# Patient Record
Sex: Male | Born: 1950
Health system: Southern US, Community
[De-identification: ages and names within clinical notes are randomized; demographics above are authoritative.]

## PROBLEM LIST (undated history)

## (undated) DIAGNOSIS — I2510 Atherosclerotic heart disease of native coronary artery without angina pectoris: Secondary | ICD-10-CM

## (undated) DIAGNOSIS — R55 Syncope and collapse: Secondary | ICD-10-CM

## (undated) DIAGNOSIS — J449 Chronic obstructive pulmonary disease, unspecified: Secondary | ICD-10-CM

## (undated) DIAGNOSIS — I519 Heart disease, unspecified: Secondary | ICD-10-CM

## (undated) DIAGNOSIS — D519 Vitamin B12 deficiency anemia, unspecified: Secondary | ICD-10-CM

## (undated) DIAGNOSIS — Z9119 Patient's noncompliance with other medical treatment and regimen: Secondary | ICD-10-CM

## (undated) DIAGNOSIS — I714 Abdominal aortic aneurysm, without rupture, unspecified: Secondary | ICD-10-CM

## (undated) DIAGNOSIS — I10 Essential (primary) hypertension: Secondary | ICD-10-CM

## (undated) DIAGNOSIS — I456 Pre-excitation syndrome: Secondary | ICD-10-CM

## (undated) DIAGNOSIS — E785 Hyperlipidemia, unspecified: Secondary | ICD-10-CM

## (undated) DIAGNOSIS — D3500 Benign neoplasm of unspecified adrenal gland: Secondary | ICD-10-CM

## (undated) HISTORY — DX: Chronic obstructive pulmonary disease, unspecified: J44.9

## (undated) HISTORY — DX: Hyperlipidemia, unspecified: E78.5

## (undated) HISTORY — DX: Essential (primary) hypertension: I10

## (undated) HISTORY — DX: Patient's noncompliance with other medical treatment and regimen: Z91.19

## (undated) HISTORY — PX: CORONARY ANGIOPLASTY WITH STENT PLACEMENT: SHX49

## (undated) HISTORY — DX: Pre-excitation syndrome: I45.6

## (undated) HISTORY — PX: BLADDER SURGERY: SHX569

## (undated) HISTORY — DX: Atherosclerotic heart disease of native coronary artery without angina pectoris: I25.10

---

## 1993-08-22 HISTORY — PX: CORONARY ARTERY BYPASS GRAFT: SHX141

## 1998-07-13 ENCOUNTER — Encounter: Payer: Self-pay | Admitting: Urology

## 1998-07-15 ENCOUNTER — Ambulatory Visit (HOSPITAL_COMMUNITY): Admission: RE | Admit: 1998-07-15 | Discharge: 1998-07-15 | Payer: Self-pay | Admitting: Urology

## 1998-07-15 ENCOUNTER — Encounter: Payer: Self-pay | Admitting: Urology

## 1998-08-24 ENCOUNTER — Inpatient Hospital Stay (HOSPITAL_COMMUNITY): Admission: AD | Admit: 1998-08-24 | Discharge: 1998-08-27 | Payer: Self-pay | Admitting: Urology

## 1998-08-25 ENCOUNTER — Encounter: Payer: Self-pay | Admitting: Urology

## 1998-08-27 ENCOUNTER — Encounter: Payer: Self-pay | Admitting: Internal Medicine

## 1998-08-31 ENCOUNTER — Inpatient Hospital Stay (HOSPITAL_COMMUNITY): Admission: RE | Admit: 1998-08-31 | Discharge: 1998-09-04 | Payer: Self-pay | Admitting: Urology

## 2000-01-25 ENCOUNTER — Encounter: Payer: Self-pay | Admitting: Oral Surgery

## 2000-01-26 ENCOUNTER — Ambulatory Visit (HOSPITAL_COMMUNITY): Admission: RE | Admit: 2000-01-26 | Discharge: 2000-01-26 | Payer: Self-pay | Admitting: Oral Surgery

## 2005-02-20 ENCOUNTER — Emergency Department (HOSPITAL_COMMUNITY): Admission: EM | Admit: 2005-02-20 | Discharge: 2005-02-20 | Payer: Self-pay | Admitting: *Deleted

## 2005-02-20 ENCOUNTER — Inpatient Hospital Stay (HOSPITAL_COMMUNITY): Admission: AD | Admit: 2005-02-20 | Discharge: 2005-02-24 | Payer: Self-pay | Admitting: Internal Medicine

## 2005-02-21 ENCOUNTER — Ambulatory Visit: Payer: Self-pay | Admitting: Cardiology

## 2005-03-03 ENCOUNTER — Ambulatory Visit: Payer: Self-pay | Admitting: *Deleted

## 2006-03-02 ENCOUNTER — Observation Stay (HOSPITAL_COMMUNITY): Admission: AD | Admit: 2006-03-02 | Discharge: 2006-03-03 | Payer: Self-pay | Admitting: Cardiology

## 2006-03-02 ENCOUNTER — Ambulatory Visit: Payer: Self-pay | Admitting: Cardiology

## 2006-03-07 ENCOUNTER — Ambulatory Visit (HOSPITAL_COMMUNITY): Admission: RE | Admit: 2006-03-07 | Discharge: 2006-03-07 | Payer: Self-pay | Admitting: *Deleted

## 2006-03-07 ENCOUNTER — Ambulatory Visit: Payer: Self-pay | Admitting: Cardiology

## 2006-03-17 ENCOUNTER — Ambulatory Visit: Payer: Self-pay | Admitting: *Deleted

## 2006-03-30 ENCOUNTER — Ambulatory Visit: Payer: Self-pay | Admitting: Cardiology

## 2006-04-03 ENCOUNTER — Ambulatory Visit: Payer: Self-pay | Admitting: Cardiology

## 2006-04-03 ENCOUNTER — Inpatient Hospital Stay (HOSPITAL_BASED_OUTPATIENT_CLINIC_OR_DEPARTMENT_OTHER): Admission: RE | Admit: 2006-04-03 | Discharge: 2006-04-03 | Payer: Self-pay | Admitting: Cardiology

## 2006-04-06 ENCOUNTER — Ambulatory Visit: Payer: Self-pay | Admitting: Cardiology

## 2006-04-06 ENCOUNTER — Inpatient Hospital Stay (HOSPITAL_COMMUNITY): Admission: AD | Admit: 2006-04-06 | Discharge: 2006-04-07 | Payer: Self-pay | Admitting: Cardiology

## 2006-04-19 ENCOUNTER — Ambulatory Visit: Payer: Self-pay | Admitting: *Deleted

## 2006-10-11 ENCOUNTER — Ambulatory Visit: Payer: Self-pay | Admitting: Cardiovascular Disease

## 2008-08-29 ENCOUNTER — Ambulatory Visit: Payer: Self-pay | Admitting: Cardiology

## 2008-09-10 ENCOUNTER — Encounter (INDEPENDENT_AMBULATORY_CARE_PROVIDER_SITE_OTHER): Payer: Self-pay | Admitting: *Deleted

## 2008-09-10 LAB — CONVERTED CEMR LAB
AST: 19 units/L
Alkaline Phosphatase: 83 units/L
Cholesterol: 111 mg/dL
HDL: 27 mg/dL
LDL Cholesterol: 60 mg/dL
Triglycerides: 119 mg/dL

## 2009-02-27 ENCOUNTER — Encounter: Payer: Self-pay | Admitting: Cardiology

## 2009-03-23 ENCOUNTER — Encounter: Payer: Self-pay | Admitting: Cardiology

## 2009-08-24 ENCOUNTER — Encounter: Payer: Self-pay | Admitting: Cardiology

## 2009-09-10 ENCOUNTER — Encounter (INDEPENDENT_AMBULATORY_CARE_PROVIDER_SITE_OTHER): Payer: Self-pay | Admitting: *Deleted

## 2009-09-10 DIAGNOSIS — K219 Gastro-esophageal reflux disease without esophagitis: Secondary | ICD-10-CM | POA: Insufficient documentation

## 2009-09-10 DIAGNOSIS — F17200 Nicotine dependence, unspecified, uncomplicated: Secondary | ICD-10-CM | POA: Insufficient documentation

## 2009-09-10 DIAGNOSIS — E785 Hyperlipidemia, unspecified: Secondary | ICD-10-CM | POA: Insufficient documentation

## 2009-09-10 DIAGNOSIS — I456 Pre-excitation syndrome: Secondary | ICD-10-CM

## 2009-12-02 ENCOUNTER — Encounter: Payer: Self-pay | Admitting: Cardiology

## 2010-02-19 ENCOUNTER — Ambulatory Visit: Payer: Self-pay | Admitting: Cardiology

## 2010-02-19 DIAGNOSIS — I10 Essential (primary) hypertension: Secondary | ICD-10-CM

## 2010-02-19 DIAGNOSIS — I2510 Atherosclerotic heart disease of native coronary artery without angina pectoris: Secondary | ICD-10-CM | POA: Insufficient documentation

## 2010-02-19 DIAGNOSIS — R0602 Shortness of breath: Secondary | ICD-10-CM | POA: Insufficient documentation

## 2010-09-21 NOTE — Miscellaneous (Signed)
Summary: LABS LIPID,LIVER 09/10/2008  Clinical Lists Changes  Observations: Added new observation of ALBUMIN: 4.5 g/dL (16/05/9603 54:09) Added new observation of PROTEIN, TOT: 6.8 g/dL (81/19/1478 29:56) Added new observation of SGPT (ALT): 22 units/L (09/10/2008 15:10) Added new observation of SGOT (AST): 19 units/L (09/10/2008 15:10) Added new observation of ALK PHOS: 83 units/L (09/10/2008 15:10) Added new observation of BILI DIRECT: 0.2 mg/dL (21/30/8657 84:69) Added new observation of LDL: 60 mg/dL (62/95/2841 32:44) Added new observation of HDL: 27 mg/dL (08/24/7251 66:44) Added new observation of TRIGLYC TOT: 119 mg/dL (03/47/4259 56:38) Added new observation of CHOLESTEROL: 111 mg/dL (75/64/3329 51:88)

## 2010-09-21 NOTE — Miscellaneous (Signed)
Summary: lipitor refill  Clinical Lists Changes  Medications: Added new medication of LIPITOR 80 MG TABS (ATORVASTATIN CALCIUM) Take one tablet by mouth daily. - Signed Rx of LIPITOR 80 MG TABS (ATORVASTATIN CALCIUM) Take one tablet by mouth daily.;  #30 x 0;  Signed;  Entered by: Teressa Lower RN;  Authorized by: Loreli Slot, MD, [facility];  Method used: Electronically to [facility]*, [address], [place], [place], [place]  [address], Ph: [phone], Fax: [phone]    Prescriptions: LIPITOR 80 MG TABS (ATORVASTATIN CALCIUM) Take one tablet by mouth daily.  #30 x 0   Entered by:   Teressa Lower RN   Authorized by:   Loreli Slot, MD, [facility]   Signed by:   Teressa Lower RN on 08/24/2009   Method used:   Electronically to        [facility]* (retail)       [address]       [place], [place]  [address]       Ph: [phone]       Fax: [phone]   RxID:   [id number]

## 2010-09-21 NOTE — Assessment & Plan Note (Signed)
Summary: ROV  Medications Added TRICOR 145 MG TABS (FENOFIBRATE) take 1 tab daily ASPIRIN 325 MG TABS (ASPIRIN) take 1 tab daily      Allergies Added: NKDA  Visit Type:  Follow-up Primary Provider:  Dr. Patrica Duel   History of Present Illness: 60 year old male presents for followup. He was last seen in January of 2010. History as outlined below. He preferred conservative medical therapy following our last visit, not wanting to pursue further ischemic workup at that time.  Lipid profile from January 2010 showed cholesterol 111, triglycerides 119, HDL 27, and LDL 60. AST and ALT were normal at 19 and 22 respectively. He has had no followup labs as best I can tell.  [name] has not been particularly compliant with his followup. He reports that he is taking all his medications however. From a symptom perspective he describes NYHA class 2-3 dyspnea on exertion, occasional angina. We discussed his cardiovascular history, and the potential that he has had progression since 2007 when he underwent drug-eluting stent placement to the SVG to OM1. He reports financial difficulties and psychosocial stress at home. He indicates that he does not want to pursue any further testing at this time.  Current Medications (verified): 1)  Plavix 75 Mg Tabs (Clopidogrel Bisulfate) .... Take One Tablet By Mouth Daily 2)  Metoprolol Tartrate 50 Mg Tabs (Metoprolol Tartrate) .Marland Kitchen.. 1 Tab Two Times A Day 3)  Ramipril 2.5 Mg Caps (Ramipril) .... Take One Capsule By Mouth Daily 4)  Lipitor 80 Mg Tabs (Atorvastatin Calcium) .... Take One Tablet By Mouth Daily. 5)  Pantoprazole Sodium 40 Mg Tbec (Pantoprazole Sodium) .... Take 1 Tablet By Mouth Once A Day 6)  Tricor 145 Mg Tabs (Fenofibrate) .... Take 1 Tab Daily 7)  Aspirin 325 Mg Tabs (Aspirin) .... Take 1 Tab Daily  Allergies (verified): No Known Drug Allergies  Past History:  Past Medical History: Last updated: 09/11/2009 Wolff-Parkinson-White syndrome  (failed ablation) CAD - multivessel, LVEF 50%, occluded SVG to RCA, DES SVG to OM system 2007 Hyperlipidemia Hypertension Noncompliance  Past Surgical History: Last updated: 09/11/2009 CABG 1995 - LIMA to LAD, SVG to diagonal, SVG to OM1 and OM2, SVG to RCA Bladder surgery  Social History: Last updated: 09/11/2009 Tobacco Use - Yes Alcohol Use - no  Clinical Review Panels:  Echocardiogram Echocardiogram M-MODE:  Aorta 3.3, left atrium 4.5, septum 1.1, posterior wall 1.1, LV  diastole 5.2, LV systole 3.6.   1.  Technically adequate echocardiographic study.  2.  Mild left atrial enlargement; normal right atrium and right ventricle.  3.  Normal and trileaflet aortic valve.  4.  Delicate mitral valve; very mild regurgitation.  5.  Normal tricuspid and pulmonic valves; normal proximal pulmonary artery.  6.  Left ventricular size is at the upper limit of normal; mild LVH.  There      is akinesis of the basilar and mid inferior and posterior segments with      mildly impaired overall LV systolic function.  Estimated ejection      fraction is 0.45.  7.  Normal IVC. (03/14/2006)    Review of Systems       The patient complains of chest pain and dyspnea on exertion.  The patient denies anorexia, fever, syncope, peripheral edema, prolonged cough, headaches, melena, hematochezia, and severe indigestion/heartburn.         Otherwise reviewed and negative except as outlined.  Vital Signs:  Patient profile:   60 year old male Height:  69 inches Weight:      169 pounds BMI:     25.05 Pulse rate:   67 / minute BP sitting:   123 / 79  (right arm)  Vitals Entered By: Dreama Saa, CNA (February 19, 2010 9:55 AM)  Physical Exam  Additional Exam:  Overweight male in no acute distress. No active chest pain. HEENT: Conjunctiva and lids normal, oropharynx with poor dentition. Neck: Supple, no JVP or thyromegaly. Lungs: Coarse, diminished breath sounds, nonlabored, no  wheezing. Cardiac: Regular rate and rhythm, 2-3/6 blowing midsystolic murmur at the apex no S3. Abdomen: Soft, nontender, bowel sounds present. Skin: Warm and dry. Extremities: No pitting edema. Neuropsychiatric: Alert and oriented x3, affect grossly appropriate.   EKG  Procedure date:  02/19/2010  Findings:      Sinus rhythm with left anterior fascicular block and nonspecific T-wave changes.  Impression & Recommendations:  Problem # 1:  CORONARY ATHEROSCLEROSIS NATIVE CORONARY ARTERY (ICD-414.01)  Multivessel CAD with graft disease including an occluded SVG to RCA, status post DES to the SVG to OM system in 2007. I suspect [name] has had progressive disease in the last few years manifested as fatigue and shortness of breath. He still does not want to pursue any objective ischemic evaluation, and prefers medical therapy. We discussed this again today. I will schedule a 6 month visit, sooner if his symptoms worsen. He has not been particularly compliant with his followup however. He reports he is taking all his medications outlined above and needs no specific refills today.  His updated medication list for this problem includes:    Plavix 75 Mg Tabs (Clopidogrel bisulfate) .Marland Kitchen... Take one tablet by mouth daily    Metoprolol Tartrate 50 Mg Tabs (Metoprolol tartrate) .Marland Kitchen... 1 tab two times a day    Ramipril 2.5 Mg Caps (Ramipril) .Marland Kitchen... Take one capsule by mouth daily    Aspirin 325 Mg Tabs (Aspirin) .Marland Kitchen... Take 1 tab daily  Problem # 2:  WOLFF (WOLFE)-PARKINSON-WHITE (WPW) SYNDROME (ICD-426.7)  No palpitations. Status post failed ablation in the past.  His updated medication list for this problem includes:    Plavix 75 Mg Tabs (Clopidogrel bisulfate) .Marland Kitchen... Take one tablet by mouth daily    Metoprolol Tartrate 50 Mg Tabs (Metoprolol tartrate) .Marland Kitchen... 1 tab two times a day    Ramipril 2.5 Mg Caps (Ramipril) .Marland Kitchen... Take one capsule by mouth daily    Aspirin 325 Mg Tabs (Aspirin) .Marland Kitchen... Take  1 tab daily  Problem # 3:  HYPERTENSION (ICD-401.9)  Blood pressure well controlled today.  His updated medication list for this problem includes:    Metoprolol Tartrate 50 Mg Tabs (Metoprolol tartrate) .Marland Kitchen... 1 tab two times a day    Ramipril 2.5 Mg Caps (Ramipril) .Marland Kitchen... Take one capsule by mouth daily    Aspirin 325 Mg Tabs (Aspirin) .Marland Kitchen... Take 1 tab daily  Orders: EKG w/ Interpretation ([address])  Problem # 4:  HYPERLIPIDEMIA (ICD-272.4)  Patient has not had any followup labs. We discussed this today. He does not want to do any testing now, however will arrange fasting lipid profile liver function tests prior to his next visit. He reports tolerating Lipitor well.  His updated medication list for this problem includes:    Lipitor 80 Mg Tabs (Atorvastatin calcium) .Marland Kitchen... Take one tablet by mouth daily.    Tricor 145 Mg Tabs (Fenofibrate) .Marland Kitchen... Take 1 tab daily  Future Orders: T-Lipid Profile ([address]) ... 08/20/2010 T-Hepatic Function [id number] ... 08/20/2010  Patient Instructions:  1)  Your physician recommends that you schedule a follow-up appointment in: 6 months 2)  Your physician recommends that you return for lab work in: 6 months, just before next office visit 3)  Your physician recommends that you continue on your current medications as directed. Please refer to the Current Medication list given to you today.  Prevention & Chronic Care Immunizations   Influenza vaccine: Not documented    Tetanus booster: Not documented    Pneumococcal vaccine: Not documented  Colorectal Screening   Hemoccult: Not documented    Colonoscopy: Not documented  Other Screening   PSA: Not documented   Smoking status: current  (09/11/2009)  Lipids   Total Cholesterol: 111  (09/10/2008)   LDL: 60  (09/10/2008)   LDL Direct: Not documented   HDL: 27  (09/10/2008)   Triglycerides: 119  (09/10/2008)    SGOT (AST): 19  (09/10/2008)   SGPT (ALT): 22  (09/10/2008)   Alkaline  phosphatase: 83  (09/10/2008)   Total bilirubin: Not documented  Hypertension   Last Blood Pressure: 123 / 79  (02/19/2010)   Serum creatinine: Not documented   Serum potassium Not documented  Self-Management Support :    Hypertension self-management support: Not documented    Lipid self-management support: Not documented

## 2010-09-21 NOTE — Letter (Signed)
Summary: [place] [facility] [employer]  [address] [place] [address]   Phone: [phone]  Fax: [phone]     February 19, 2010 MRN: [id number]   [facility] [address] [place], [place]  [address]      YOUR LAB WORK IS DUE  August 20, 2010 _________________________________________  Please go to [facility], located across the street from [facility] on the second floor.  Hours are Monday - Friday 7am until 7:30pm         Saturday 8am until 12noon    _X_  DO NOT EAT OR DRINK AFTER MIDNIGHT EVENING PRIOR TO LABWORK  __ YOUR LABWORK IS NOT FASTING --YOU MAY EAT PRIOR TO LABWORK

## 2010-09-21 NOTE — Miscellaneous (Signed)
Summary: pantoprazole med update  Clinical Lists Changes  Medications: Added new medication of PANTOPRAZOLE SODIUM 40 MG TBEC (PANTOPRAZOLE SODIUM) Take 1 tablet by mouth once a day

## 2010-11-01 ENCOUNTER — Encounter: Payer: Self-pay | Admitting: *Deleted

## 2010-11-01 ENCOUNTER — Telehealth: Payer: Self-pay | Admitting: *Deleted

## 2010-11-09 NOTE — Letter (Signed)
Summary: [place] [facility], [facility] at [employer]  [address] [place] [address]   Phone: [phone]  Fax: [phone]     November 01, 2010 MRN: [id number]   [facility] [address] [place], [place]  [address]      YOUR LAB WORK IS DUE   MONDAY   November 08, 2010  Please go to [facility], located across the street from [facility] on the second floor.  Hours are Monday - Friday 7am until 7:30pm         Saturday 8am until 12noon    _X_  DO NOT EAT OR DRINK AFTER MIDNIGHT EVENING PRIOR TO LABWORK

## 2010-11-09 NOTE — Progress Notes (Signed)
Summary: Refills and Labwork   Phone Note Call from Patient   Caller: Patient Reason for Call: Talk to Nurse Summary of Call: patient would like to discuss refills on meds and labwork to be drawn . tg Initial call taken by: Raechel Ache [facility],  November 01, 2010 2:35 PM    Prescriptions: TRICOR 145 MG TABS (FENOFIBRATE) take 1 tab daily  #30 x 1   Entered by:   Teressa Lower RN   Authorized by:   Loreli Slot, MD, [facility]   Signed by:   Teressa Lower RN on 11/01/2010   Method used:   Electronically to        [facility]* (retail)       [address]       [place], [place]  [address]       Ph: [phone]       Fax: [phone]   RxID:   [id number] PANTOPRAZOLE SODIUM 40 MG TBEC (PANTOPRAZOLE SODIUM) Take 1 tablet by mouth once a day  #30 x 1   Entered by:   Teressa Lower RN   Authorized by:   Loreli Slot, MD, [facility]   Signed by:   Teressa Lower RN on 11/01/2010   Method used:   Electronically to        [facility]* (retail)       [address]       [place], [place]  [address]       Ph: [phone]       Fax: [phone]   RxID:   [id number] LIPITOR 80 MG TABS (ATORVASTATIN CALCIUM) Take one tablet by mouth daily.  #30 Each x 1   Entered by:   Teressa Lower RN   Authorized by:   Loreli Slot, MD, [facility]   Signed by:   Teressa Lower RN on 11/01/2010   Method used:   Electronically to        [facility]* (retail)       [address]       [place], [place]  [address]       Ph: [phone]       Fax: [phone]   RxID:   [id number] RAMIPRIL 2.5 MG CAPS (RAMIPRIL) Take one capsule by mouth daily  #30 x 1   Entered by:   Teressa Lower RN   Authorized by:   Loreli Slot, MD, [facility]   Signed by:   Teressa Lower RN on 11/01/2010   Method used:   Electronically to        [facility]* (retail)       [address]       [place], [place]  [address]       Ph: [phone]       Fax: [phone]   RxID:   [id number] METOPROLOL TARTRATE 50 MG TABS (METOPROLOL TARTRATE) 1 tab two times a day  #60 Each x 1   Entered by:   Teressa Lower RN   Authorized by:   Loreli Slot, MD, [facility]   Signed by:   Teressa Lower RN on 11/01/2010   Method used:   Electronically to        [facility] (retail)       [address]  [place], [place]  [address]       Ph: [phone]       Fax: [phone]   RxID:   [id number]

## 2010-11-23 ENCOUNTER — Other Ambulatory Visit: Payer: Self-pay | Admitting: Cardiology

## 2010-11-23 LAB — HEPATIC FUNCTION PANEL
ALT: 23 U/L (ref 0–53)
AST: 22 U/L (ref 0–37)
Albumin: 4.6 g/dL (ref 3.5–5.2)

## 2010-11-23 LAB — LIPID PANEL
Cholesterol: 126 mg/dL (ref 0–200)
HDL: 26 mg/dL — ABNORMAL LOW (ref >39)

## 2010-11-24 NOTE — Progress Notes (Signed)
**Note De-identified Hilman Kissling Obfuscation** Result letter mailed to pt.

## 2010-12-03 ENCOUNTER — Encounter: Payer: Self-pay | Admitting: Cardiology

## 2010-12-03 ENCOUNTER — Ambulatory Visit (INDEPENDENT_AMBULATORY_CARE_PROVIDER_SITE_OTHER): Payer: MEDICARE | Admitting: Cardiology

## 2010-12-03 VITALS — BP 116/70 | HR 60 | Ht 69.0 in | Wt 176.0 lb

## 2010-12-03 DIAGNOSIS — K921 Melena: Secondary | ICD-10-CM

## 2010-12-03 DIAGNOSIS — F17200 Nicotine dependence, unspecified, uncomplicated: Secondary | ICD-10-CM

## 2010-12-03 DIAGNOSIS — I2510 Atherosclerotic heart disease of native coronary artery without angina pectoris: Secondary | ICD-10-CM

## 2010-12-03 DIAGNOSIS — I10 Essential (primary) hypertension: Secondary | ICD-10-CM

## 2010-12-03 DIAGNOSIS — E782 Mixed hyperlipidemia: Secondary | ICD-10-CM

## 2010-12-03 DIAGNOSIS — I456 Pre-excitation syndrome: Secondary | ICD-10-CM

## 2010-12-03 MED ORDER — METOPROLOL TARTRATE 25 MG PO TABS: 25.0000 mg | ORAL_TABLET | Freq: Two times a day (BID) | ORAL | Status: DC

## 2010-12-03 NOTE — Assessment & Plan Note (Signed)
Continue present regimen, followup fasting lipid profile and liver function tests prior to next visit.

## 2010-12-03 NOTE — Progress Notes (Signed)
Clinical Summary [name] is a 60 y.o.male presenting for followup. He was seen in July 2011. In general he has preferred fairly conservative management with no major followup testing.  He reports occasional palpitations, none prolonged, no syncope. Has chronic shortness of breath, continues to smoke cigarettes. He states that he has not been able to quit. We did discuss smoking cessation strategies today including the quit line, also nicotine replacement.  He reports no angina, no nitroglycerin use. Indicates compliance with his medications.  Lab work from 3 April shows total cholesterol 126, triglycerides 111, HDL 26, LDL 78, AST 22, ALT 23.  He reports no regular follow up with primary care recently.    No Known Allergies  Current outpatient prescriptions:aspirin 325 MG tablet, Take 325 mg by mouth daily.  , Disp: , Rfl: ;  atorvastatin (LIPITOR) 80 MG tablet, Take 80 mg by mouth daily.  , Disp: , Rfl: ;  clopidogrel (PLAVIX) 75 MG tablet, Take 75 mg by mouth daily.  , Disp: , Rfl: ;  fenofibrate (TRICOR) 145 MG tablet, Take 145 mg by mouth daily.  , Disp: , Rfl:  metoprolol (LOPRESSOR) 25 MG tablet, Take 1 tablet (25 mg total) by mouth 2 (two) times daily., Disp: 180 tablet, Rfl: 1;  pantoprazole (PROTONIX) 40 MG tablet, Take 40 mg by mouth daily.  , Disp: , Rfl: ;  ramipril (ALTACE) 2.5 MG capsule, Take 2.5 mg by mouth daily.  , Disp: , Rfl: ;  DISCONTD: metoprolol (LOPRESSOR) 50 MG tablet, Take 50 mg by mouth daily. , Disp: , Rfl:   Past Medical History  Diagnosis Date  . Wolff-Parkinson-White (WPW) syndrome     Failed ablation  . Coronary artery disease     Multivessel, LVEF 50%, occluded SVG to RCA, DES SVG to OM system 2007  . Hyperlipidemia   . Hypertension   . Noncompliance     Social History [name] reports that he has been smoking Cigarettes.  He has never used smokeless tobacco. [name] reports that he does not drink alcohol.  Review of Systems States he  occasionally sees a small amount of blood in his stools, not regularly. Otherwise reports stable appetite, no indigestion or unusual weight loss. Otherwise negative except as outlined above.  Physical Examination Filed Vitals:   12/03/10 1258  BP: 116/70  Pulse: 60  Overweight male in no acute distress. No active chest pain. HEENT: Conjunctiva and lids normal, oropharynx with poor dentition. Neck: Supple, no JVP or thyromegaly. Lungs: Coarse, diminished breath sounds, nonlabored, no wheezing. Cardiac: Regular rate and rhythm, soft midsystolic murmur at the apex no S3. Abdomen: Soft, nontender, bowel sounds present. Skin: Warm and dry. Extremities: No pitting edema. Neuropsychiatric: Alert and oriented x3, affect grossly appropriate.   ECG Sinus rhythm at 60 beats per minutes, leftward axis, nonspecific T wave changes.   Problem List and Plan

## 2010-12-03 NOTE — Assessment & Plan Note (Signed)
Blood pressure well-controlled today. 

## 2010-12-03 NOTE — Patient Instructions (Addendum)
Your physician recommends that you return for lab work in: 6 months, just before next office visit Your physician has recommended you make the following change in your medication: decrease Metoprolol to 25mg  twice daily Your physician recommends that you schedule a follow-up appointment in: 6 months

## 2010-12-03 NOTE — Assessment & Plan Note (Signed)
Per patient report. I explained that this needs to be looked into further, and that he should followup with his primary care physician soon. He states he has never had a colonoscopy. He is on aspirin and Plavix for his cardiovascular disease. He states that he would go and see Dr. Regino Schultze for followup.

## 2010-12-03 NOTE — Assessment & Plan Note (Signed)
We again discussed smoking cessation strategies.

## 2010-12-03 NOTE — Assessment & Plan Note (Signed)
No reported progression in angina or shortness of breath at baseline. Medications were reviewed. He states that he is taking Lopressor 50 mg once a day. We will switch this to 25 mg twice a day, and can uptitrate if need be.

## 2010-12-03 NOTE — Assessment & Plan Note (Signed)
Relatively quiescent, occasional brief palpitations. No syncope.

## 2011-01-04 NOTE — Assessment & Plan Note (Signed)
[facility]                       [place] CARDIOLOGY OFFICE NOTE   NAME:[name], [name]                      MRN:          [id number]  DATE:08/29/2008                            DOB:          24-Nov-1950    PRIMARY CARE PHYSICIAN:  Patrica Duel, MD   REASON FOR VISIT:  Cardiac followup.   HISTORY OF PRESENT ILLNESS:  This is my first meeting with [name] in  the office.  He has been followed by a number of physicians in our  practice, most recently Dr. Eden Emms.  His cardiac history includes Evelene Croon-  Parkinson-White syndrome, reportedly status post failed attempt at  ablation by Dr. Graciela Husbands years ago, but reasonably well managed  symptomatically on beta-blocker therapy at this point.  He has coronary  artery disease status post coronary artery bypass grafting in 1995 with  a LIMA to the left anterior descending, vein graft to the diagonal, vein  graft to the first and second obtuse marginals, and vein graft to the  right coronary artery.  He was noted more recently in 2006 to have an  occluded vein graft to the right coronary artery and subsequently  underwent stent placement within the vein graft to the obtuse marginal  system in 2007.  [name] presents to the office today following  trying to get a prescription filled for his Lipitor and TriCor and being  told that he had to schedule a followup visit with his physician.  He  has not been seen here since February 2008.   He reports stable NYHA class II dyspnea on exertion, sometimes class III  when he pushes it, but has no significant anginal chest pain and is  not requiring any sublingual nitroglycerin.  He has not had a lipid  profile in some time.  LDL was 92 as of November 9562.  He has also not  had any followup stress testing.  Dr. Eden Emms recommended a Myoview in  2008, although the last one I can locate in the chart is from 2007.  Mr.  [name] and I discussed this some today and he  indicates generally  preferring conservative medical management and is very wary of stress  testing and the way that it makes him feel.  He has had no rapid  palpitations or syncope, although he does feel brief palpitations  typically in the mornings.  He denies having any prolonged tachycardia  related to his Wolff-Parkinson-White syndrome.  He continues to smoke  cigarettes, although he does voice an interest in trying to quit.  He  does state that he has tried several times and has been unsuccessful.  We talked about this some as well.   PRESENT MEDICATIONS:  1. Plavix 75 mg p.o. daily.  2. TriCor 45 mg p.o. daily.  3. Aspirin 325 mg p.o. daily.  4. Protonix 40 mg p.o. daily.  5. Altace 2.5 mg p.o. daily.  6. Lipitor 80 mg p.o. at bedtime.  7. Metoprolol 50 mg p.o. daily.  8. Sublingual nitroglycerin 0.4 mg p.r.n.  9. Xanax p.r.n.   REVIEW OF SYSTEMS:  As outlined above.  Otherwise  negative.   PHYSICAL EXAMINATION:  VITAL SIGNS:  Blood pressure is 138/88, heart  rate is 70, weight is 172 pounds, down from 175.  GENERAL:  The patient is in no acute distress.  HEENT:  Conjunctivae lids normal.  Oropharynx is clear.  NECK:  Supple.  No elevated jugular venous pressure.  No loud bruits.  No thyromegaly.  LUNGS:  Clear with diminished breath sounds.  No wheezing is noted.  CARDIAC:  Regular rate and rhythm.  Soft basal systolic murmur,  preserved second heart sound.  No pericardial rub.  ABDOMEN:  Soft, nontender.  No active bowel sounds.  EXTREMITIES:  No frank pitting edema.  Distal pulses are 1+.  SKIN:  Warm and dry.  MUSCULOSKELETAL:  No kyphosis noted.  NEUROPSYCHIATRIC:  The patient is alert and oriented x3.  Affect seems  appropriate.   IMPRESSION AND RECOMMENDATIONS:  1. Multivessel cardiovascular disease status post coronary artery      bypass grafting in 1995 with known subsequently documented graft      disease status post percutaneous intervention most recently in  2007      addressing the vein graft to the obtuse marginal system.  Ejection      fraction has been in a low normal range based on the records,      assessed at 50% with inferior hypokinesis as of 2007.  [name]      is not reporting any decompensated heart failure symptoms and has      stable NYHA class II dyspnea on exertion with no active angina.  I      did recommend a followup Myoview, as it has been several years      since his last assessment, although he is not interested in      proceeding with this.  We will therefore plan to continue medical      therapy and observation of symptoms with an office followup over      the next 6 months.  His electrocardiogram today in the office      showed sinus rhythm with a leftward axis deviation which is old,      and otherwise no acute ST-T-wave changes.  2. Hyperlipidemia, on Lipitor and Tricor.  We will arrange followup      lipid profile and liver function tests.  Ideally, his LDL should be      around 70.  3. I also recommended that [name] maintain more regular followup      with his primary care physician for routine health maintenance.     Jonelle Sidle, MD  Electronically Signed    SGM/MedQ  DD: 08/29/2008  DT: 08/30/2008  Job #: [id number]   cc:   Patrica Duel, M.D.

## 2011-01-07 NOTE — Assessment & Plan Note (Signed)
[facility]                         [facility] OFFICE NOTE   NAME:Pankowski, ADVAITH LAMARQUE                      MRN:          [id number]  DATE:04/19/2006                            DOB:          11/28/1950    PRIMARY:  Patrica Duel, MD   Mr. Marcin returns after an angioplasty to the saphenous vein graft to his  obtuse marginal, which was done successful with 2 stents.  He is doing very  well, has no complaints, the leg feels fine, his groin in fine, no pain in  his leg, no chest discomfort.   PRESENT MEDICATIONS:  1. Metoprolol 50 mg twice a day.  2. Lipitor 80 mg once a day.  3. Plavix 75 mg once a day.  4. TriCor 145 mg once a day.  5. Aspirin 325 mg once a day.  6  Protonix 40 mg once a day.  1. Altace 2.5 mg once a day.  2. Nitroglycerin on an as-needed basis, has not required that since his      angioplasty.   PHYSICAL EXAMINATION:  VITAL SIGNS:  Today on physical exam, his blood  pressure is 110/72, his pulse is 74.  CHEST:  Clear.  NECK:  He has no jugular venous distention or carotid bruits.  HEART:  Regular with no murmur.  First and second heart sounds are normal.  ABDOMEN:  His groin has a small area of ecchymosis in the medial aspect.  There is no bruit.  EXTREMITIES:  His pulses are 1+ distally.   ASSESSMENT:  I think Mr. Mcconathy is doing well.  He is on an increased dose  of Lipitor and the TriCor combination in the hopes of getting his  cholesterol under better control to decrease any incidents of vein graft  disease.  Overall, his medical regimen appears to be very good and he is  tolerating it reasonably well.  He is back exercising, which is good, so  hopefully we will keep him from having any recurrent stenoses.                                   Farris Has. Dorethea Clan, MD   JMH/MedQ  DD:  04/19/2006  DT:  04/20/2006  Job #:  [id number]   cc:   Patrica Duel, MD

## 2011-01-07 NOTE — Discharge Summary (Signed)
NAMEBREYON, SIGG               ACCOUNT NO.:  [id number]   MEDICAL RECORD NO.:  [id number]          PATIENT TYPE:  INP   LOCATION:  2902                         FACILITY:  [facility]   PHYSICIAN:  Dorian Pod, NP    DATE OF BIRTH:  1951/01/26   DATE OF ADMISSION:  03/02/2006  DATE OF DISCHARGE:  03/03/2006                                 DISCHARGE SUMMARY   DISCHARGE DIAGNOSES:  1.  Chest pain, somewhat atypical.  Patient ruled out for myocardial      infarction.  Status post stress test this admission showing ejection      fraction of 46%, old infarct/scar within the inferior wall.  There is a      small area of reversibility in the adjacent septum compatible with      ischemia.  2.  Mitral regurgitation.  3.  History of medical noncompliance.  4.  History of coronary artery disease, status post coronary artery bypass      grafting in the past.  5.  Mildly elevated glucose levels this admission with a hemoglobin A1c of      5.9.  6.  Hypertension.  7.  Hyperlipidemia.  8.  Most recent catheterization February 21, 2005 following subendocardial      myocardial infarction.  Catheterization at that time showed an ejection      fraction of 60% with 1 to 2+ mitral regurgitation.  The saphenous vein      graft to the right coronary artery was completely occluded.  Patient was      treated medically.  9.  History of Wolff-Parkinson-White, status post failed attempt at      ablation.  10. History of previous bladder surgery.  11. History of noncompliance.   PROCEDURES THIS ADMISSION:  Include stress Myoview on March 03, 2006.   HOSPITAL COURSE:  Mr. Passe is a 60 year old Caucasian gentleman with past  medical history as stated above who presented with complaints of chest  discomfort in setting of known history of coronary artery disease.  EKG  showed normal sinus rhythm with a left anterior fascicular block and  nonspecific ST changes.  Chest x-ray without any acute findings.  The  patient admitted, ruled out myocardial infarction, proceeded with stress  test, results as stated above.  Findings discussed with Dr. Daleen Squibb and patient  is adamant that he is going to be discharged.   PLAN:  The plan at this time is to discharge patient home, have him follow  up with Dr. Dorethea Clan.  Patient will also need an outpatient 2D echocardiogram  done.  This can be done through Dr. Marchelle Folks office in [place].  There  was also a question of some melena 1 month ago, per patient.  We continued  him on his Protonix.   DISCHARGE BLOOD WORK:  CBC:  H&H 14.1 and 42.1.  TSH 3.720.  Hemoglobin A1c  5.9.  Glucose 108.  Potassium 3.7.  BUN 5, creatinine 1.2, AST 18, ALT 26.   DISCHARGE INSTRUCTIONS:  Patient being discharged home with instructions to  continue his previous medications.  I have given  him a prescription for  nitroglycerin sublingual p.r.n.   DISCHARGE MEDICATIONS:  1.  Aspirin.  2.  Protonix 40 mg.  3.  Plavix 75 mg.  4.  Lipitor 40 mg.  5.  Lopressor 25.  6.  Tricor.   FOLLOWUP:  He is to follow up with Dr. Dorethea Clan within the next 2 weeks for an  outpatient echocardiogram and post-hospitalization visit.  I have called the  [place] office and left a message for them to call patient at home to  schedule appointments.  I have instructed Mr. Monette to return to the  emergency room or seek medical assistance if he has to take 3 nitroglycerin  without relief within 15 minutes.  Otherwise, follow up with Dr. Dorethea Clan as  previously scheduled.      Dorian Pod, NP     MB/MEDQ  D:  03/03/2006  T:  03/04/2006  Job:  [id number]   cc:   Thomas C. Wall, M.D.  [address]  [address]  [place]  [place] [address]   Vida Roller, M.D.  Fax: [phone]

## 2011-01-07 NOTE — Cardiovascular Report (Signed)
[name], [name]               ACCOUNT NO.:  [id number]   MEDICAL RECORD NO.:  [id number]          PATIENT TYPE:  INP   LOCATION:  6533                         FACILITY:  [facility]   PHYSICIAN:  Salvadore Farber, MD  DATE OF BIRTH:  [birth year]   DATE OF PROCEDURE:  04/06/2006  DATE OF DISCHARGE:                              CARDIAC CATHETERIZATION   PROCEDURE:  Placement of two overlapping drug-eluting stents to the  saphenous vein graft to the obtuse marginal, using Proxis Embolic  Protection.   INDICATIONS:  Mr. Leung is a 60 year old gentleman status post coronary  artery bypass grafting in 1995.  He was recently briefly hospitalized with  unstable angina.  He was subsequently discharged home.  A resting Myoview  demonstrated an ejection fraction of 46%, with a fixed inferior defect and a  reversible anteroseptal defect.  He underwent diagnostic angiography by Dr.  Antoine Poche.  That demonstrated 99% stenosis in the saphenous vein graft to two  obtuse marginal branches.  He returns today for planned intervention on  this.   PROCEDURAL TECHNIQUE:  Informed consent was obtained.  Under 1% lidocaine  local anesthesia, a 6-French sheath was placed in the right common femoral  artery using the modified Seldinger technique.  The patient had been  maintained on Plavix for more than 5 days prior to the procedure.  Anticoagulation was initiated with bivalirudin.  ACT was confirmed to be  greater than 225 seconds.   A 6-French AL-1 guide was advanced over a wire and engaged in the ostium of  the vein graft to the marginal.  A Prowater wire was advanced into the  proximal graft, taking care not to cross the lesion.  I then advanced a  Proxis device over this into the proximal graft.  The Proxis balloon was  inflated and cessation of flow in graft confirmed by gentle contrast  injection.  There were fairly robust collaterals to the territory, allowing  dye washout.  I then advanced the  Prowater wire across into the more distal  obtuse marginal without difficulty.  I then pre-dilated the entirety of the  lesion using a 2 x 20 mm Maverick for 2 inflations each at 6 atmospheres.  I  then deflated the Proxis device after aspirating debris.  We then reinflated  the Proxis device and advanced a 2.5 x 16 mm Taxus stent across the more  distal of the two anastomoses into the second of the 2 grafted marginals.  It extended approximately 5 mm into the native vessel and across the distal  anastomosis.  I deployed it at 12 atmospheres.  I then advanced a 3 x 24 mm  Taxus stent, positioning it so as to overlap the previously placed stent by  approximately 2 mm, and to cross the entire segment of disease, including  jailing the more anastomosis with the more proximal obtuse marginal.  I  deployed this at 14 atmospheres.  We then again deflated the Proxis balloon  after aspiration.  Finally, we reinflated the Proxis balloon and postdilated  the stents using 2.5 x 20 mm Quantum at 14 atmospheres  for the distal stent,  a 3 x 20 mm Quantum for the region of overlap, hitting the region of overlap  at 18 atmospheres, then a 3.25 x 20 mm Quantum balloon at 16 atmospheres  proximally.  We then again aspirated from the graft and deflated the Proxis  device.  Final angiography demonstrated no residual stenosis, no dissection,  and TIMI 3 flow into both of the grafted marginals.   Substantial sandy debris plus one large atherothrombotic chunk was removed  with aspiration from the Proxis device.   Finally, the arteriotomy was closed using a StarClose device.  Complete  hemostasis was obtained.  The patient was then transferred to the holding  room in stable condition.   COMPLICATIONS:  None.   IMPRESSION/PLAN:  Successful placement of two drug-eluting stents in the  saphenous vein graft to the marginal.  This reduced the stenosis from 99% to  0%.  Flow in both marginals is maintained.  Due to  the overlapping nature of  the two stents, he should be maintained on aspirin and Plavix indefinitely.      Salvadore Farber, MD  Electronically Signed     WED/MEDQ  D:  04/06/2006  T:  04/06/2006  Job:  [id number]   cc:   Patrica Duel, M.D.  Gerrit Friends. Dietrich Pates, MD, [facility]

## 2011-01-07 NOTE — Discharge Summary (Signed)
[name], [name]               ACCOUNT NO.:  [id number]   MEDICAL RECORD NO.:  [id number]          PATIENT TYPE:  INP   LOCATION:  2902                         FACILITY:  [facility]   PHYSICIAN:  Dorian Pod, NP    DATE OF BIRTH:  03-Oct-1950   DATE OF ADMISSION:  03/02/2006  DATE OF DISCHARGE:  03/03/2006                                 DISCHARGE SUMMARY   DISCHARGE DIAGNOSES:  1.  Chest pain ruled out for a myocardial infarction status post stress      Myoview showing EF of 46%.  A fixed defect involving the inferior wall      compatible with old infarct.  An area of reversibility noted in the      adjacent septum compatible with a small area of peri infarct ischemia.  2.  Mitral regurgitation murmur on exam pending outpatient echocardiogram.  3.  History of medical compliance.  4.  Hypercholesteremia.  5.  History of coronary artery disease status post coronary artery bypass      graft with a left internal mammary artery to the left anterior      descending.  Saphenous vein graft to the OM1, saphenous vein graft to      the OM2.  Saphenous vein graft to the right coronary artery completing      occluded by cath in 2006.      Dorian Pod, NP     MB/MEDQ  D:  03/03/2006  T:  03/04/2006  Job:  [id number]

## 2011-01-07 NOTE — Procedures (Signed)
[name], [name]               ACCOUNT NO.:  [id number]   MEDICAL RECORD NO.:  [id number]          PATIENT TYPE:  OUT   LOCATION:  RAD                           FACILITY:  [facility]   PHYSICIAN:  Carl Bing, M.D. [facility] OF BIRTH:  1951-06-22   DATE OF PROCEDURE:  03/07/2006  DATE OF DISCHARGE:                                  ECHOCARDIOGRAM   REFERRING PHYSICIAN:  Vida Roller, M.D.  Patrica Duel, M.D.   CLINICAL DATA:  60 year old gentleman with prior CABG surgery.   M-MODE:  Aorta 3.3, left atrium 4.5, septum 1.1, posterior wall 1.1, LV  diastole 5.2, LV systole 3.6.   1.  Technically adequate echocardiographic study.  2.  Mild left atrial enlargement; normal right atrium and right ventricle.  3.  Normal and trileaflet aortic valve.  4.  Delicate mitral valve; very mild regurgitation.  5.  Normal tricuspid and pulmonic valves; normal proximal pulmonary artery.  6.  Left ventricular size is at the upper limit of normal; mild LVH.  There      is akinesis of the basilar and mid inferior and posterior segments with      mildly impaired overall LV systolic function.  Estimated ejection      fraction is 0.45.  7.  Normal IVC.      Polo Bing, M.D. [facility]  Electronically Signed     RR/MEDQ  D:  03/07/2006  T:  03/07/2006  Job:  [id number]

## 2011-01-07 NOTE — H&P (Signed)
[name], SCHNELLE               ACCOUNT NO.:  [id number]   MEDICAL RECORD NO.:  [id number]          PATIENT TYPE:  INP   LOCATION:  2902                         FACILITY:  [facility]   PHYSICIAN:  Olga Millers, M.D. [facility] OF BIRTH:  [birth year]   DATE OF ADMISSION:  03/02/2006  DATE OF DISCHARGE:                                HISTORY & PHYSICAL   HISTORY OF PRESENT ILLNESS:  Mr. Raisanen is a 60 year old gentleman with past  medical history of coronary artery disease (past coronary artery bypassing  graft), hypertension, hyperlipidemia, who presents for evaluation of chest  pain.  The patient is status post coronary artery bypassing graft in 1995.  His most recent catheterization was performed on February 21, 2005 following  subendocardial myocardial infarction.  At that time his ejection fraction  was 60% and there was 1-2+ mitral regurgitation.  The left main was normal.  It was 99% proximal LAD but the sequential LIMA to the LAD and diagonal was  patent.  The circumflex was occluded but the sequential saphenous vein graft  to the OM-1 and OM-2 were patent.  There was a total right coronary artery  and saphenous vein graft to the right coronary artery which was occluded.  The patient was treated medically.  Since then, he has had occasional chest  pain.  However, over the past three weeks, he is complaining of increased  fatigue as well as dyspnea.  He also is complaining of a chest tightness.  The pain is almost continuous for the past three days.  It does increase  with exertion but is persistent.  There is question of some relief with  inspiration  He states that it worsens when I think about it.  There is no  associated nausea, vomiting, nor diaphoresis but there has been some  shortness of breath.  The pain is substernal but there is also a component  to the right of the sternum.  He was seen in the [facility] emergency room  today and transferred for further evaluation.   ALLERGIES:  CODEINE.   MEDICATIONS:  1.  Aspirin typically but he stopped that approximately one month ago as he      was concerned about the possibility of GI bleeding.  2.  He takes Protonix 40 mg p.o. daily.  3.  Plavix 75 mg p.o. daily.  4.  Lipitor 40 mg p.o. at night.  5.  Lopressor 25 mg p.o. b.i.d.  6.  Tricor.   SOCIAL HISTORY:  He does smoke.  He does not consume alcohol.   FAMILY HISTORY:  Strongly positive for coronary artery disease.   PAST MEDICAL HISTORY:  Hypertension and hyperlipidemia but there is no  diabetes mellitus.  He has history of coronary artery disease as outlined in  the HPI.  He also has history of WPW and is status post failed attempt at  ablation.  He has had previous bladder surgery.  There is also history of  noncompliance.   REVIEW OF SYSTEMS:  He has not had headaches.  There is a question of a  fever earlier today.  He does have fatigue.  There is no change in weight.  He denies any cough or hemoptysis.  There is no dysphagia or odynophagia.  There was a question of melena a month ago.  There is no hematochezia.  There is no dysuria or hematuria.  There is no rashes or seizure activity.  He does have some dyspnea on exertion but there is no orthopnea, PND or  pedal edema.  The remaining systems are negative.   PHYSICAL EXAMINATION:  VITAL SIGNS:  Blood pressure 138/85, and his pulse is  72.  He had a temperature of 99.2.  GENERAL:  He is well-developed, well-nourished, in no acute distress.  He  does not appear to be depressed.  There is no peripheral clubbing.  SKIN:  Warm and dry.  HEENT: Unremarkable with normal eyelids.  NECK:  Supple with normal upstroke bilaterally.  No bruits noted.  There is  no jugular venous distension and I cannot thyromegaly.  CHEST:  Clear to auscultation __________ .  CARDIOVASCULAR:  Regular rate and rhythm.  There is a 2-3/6 systolic murmur  at the apex and radiating to the left axis.  ABDOMEN:  No  tenderness to palpation.  I cannot appreciate hepatomegaly.  There are no masses palpated.  No bruits noted.  He  has 2+ femoral pulses  bilaterally.  No bruits.  EXTREMITIES:  No edema and I could palpate no cords.  He has 2+ dorsalis  pedis pulses bilaterally.  NEUROLOGIC: Grossly intact.   LABORATORY DATA:  His electrocardiogram shows a normal sinus rhythm at a  rate of 83.  There is a left anterior fascicular block and nonspecific ST  changes are noted.  His chest x-ray shows no significant disease.  His white  blood cell count is 5.9, hemoglobin 15.6, hematocrit 45.6.  His platelet  count was 230.  His sodium is 138, potassium 3.5.  His BUN and creatinine  are 3 and 1.2.  His glucose is mildly elevated at 138.  His initial enzymes  are negative.   DIAGNOSES:  1.  Atypical chest pain.  2.  Coronary artery disease, status post coronary bypassing graft.  3.  Hypertension.  4.  Hyperlipidemia.  5.  Mitral regurgitation murmur.   PLAN:  Mr Deboer presents with chest pain that is atypical.  It has been  almost continuous for the past three days.  We will plan to rule out  myocardial infarction with serial enzymes.  If they are negative, then we  will plan a Myoview for restratification.  We will continue with this  aspirin, Plavix, Lopressor and Lipitor.  I will check a TSH for his fatigue.  He will need a followup echocardiogram as an outpatient and he does have a  mitral regurgitation murmur on exam and his catheterization previously  revealed 1-2+ mitral regurgitation.  There was a question of melena one  month ago.  We will hemoccult all stool.  He will continue on his Protonix.  His glucose is mildly elevated and we will check a hemoglobin A1C.           ______________________________  Olga Millers, M.D. [facility]     BC/MEDQ  D:  03/02/2006  T:  03/02/2006  Job:  [id number]

## 2011-01-07 NOTE — Cardiovascular Report (Signed)
NAMEJAMIAN, [name]               ACCOUNT NO.:  [id number]   MEDICAL RECORD NO.:  [id number]          PATIENT TYPE:  OIB   LOCATION:  1965                         FACILITY:  [facility]   PHYSICIAN:  Rollene Rotunda, M.D.   DATE OF BIRTH:  Dec 31, 1950   DATE OF PROCEDURE:  DATE OF DISCHARGE:                              CARDIAC CATHETERIZATION   PRIMARY:  Is Dr. Patrica Duel.   CARDIOLOGIST:  Is Dr. Dionicio Stall.   PROCEDURE:  Left heart catheterization/coronary arteriography.   PROCEDURE NOTE:  Left heart catheterization performed via the right femoral  artery __________ function.  A #4-French arterial sheath was inserted via  the modified Seldinger technique.  A preformed Judkins and a pigtail  catheter were utilized.  The patient tolerated the procedure well and left  the lab in stable condition with good results.   HEMODYNAMICS:  LV 129/12, AL 130/96.   CORONARIES:  The left main had 25% stenosis.  The LAD was occluded in the  mid segment after tandem 99% proximal lesions that compromised first and  septal perforator.  The remainder of the vessel was seen to fill LIMA graft.  It was a large vessel wrapping the apex.  There were luminal irregularities.  There was a small diagonal also filling via the LIMA graft.  The circumflex  was occluded proximally.  There was an obtuse marginal one, which was small  with proximal subtotal stenosis filling via the vein graft.  OM2 was  occluded at the ostium.  OM3 was occluded proximally.  The right coronary  artery was a dominant vessel, it was occluded proximally.  There was scant  collateral filling from the left system to the distal RCA.  Grafts from the  LIMA to the LAD was patent.  This was a sequential graft to the diagonal, it  was also patent.  There was a slight anastomotic lesion at the LIMA to the  LAD.  The saphenous vein graft to the right coronary artery was known to be  occluded and was not selective injected.  The saphenous vein  graft to the  circumflex had been previously widely patent.  It was sequential to obtuse  marginal 1 and 2.  There was 50% stenosis before its insertion to the first  obtuse marginal.  There was 99% followed by less severe diffuse disease in  the segment before the second OM2.  This was a progression of disease  compared to 2006.   LEFT VENTRICULOGRAM:  Left ventriculogram was obtained in the artery of  objection.  EF is about 50% with inferior kinesis.  There was 2+ mitral  regurgitation.   CONCLUSION:  Severe native 3-vessel coronary artery disease.  Occluded right  coronary artery, patent LIMA and high-grade disease in the saphenous vein  graft to the circumflex.   PLAN:  The patient was first counseled to stop smoking.  Continue with  aggressive medical management.  I will review with Dr. Samule Ohm to discussed  the feasibility of percutaneous revascularization to the saphenous vein  graft to the circumflex.  ______________________________  Rollene Rotunda, M.D.     JH/MEDQ  D:  04/03/2006  T:  04/03/2006  Job:  [id number]   cc:   Vida Roller, M.D.  Patrica Duel, M.D.

## 2011-01-07 NOTE — Op Note (Signed)
[facility]  Patient:    NOBORU, BIDINGER                      MRN: [id number] Proc. Date: 01/26/00 Adm. Date:  [id number] Disc. Date: [id number] Attending:  Georgia Lopes CC:         Celso Sickle, M.D.                           Operative Report  PREOPERATIVE DIAGNOSES:  Multiple nonrestorable teeth, #5, 6, 8, 9, 10, 11, 12, 23, 25, 27, 26, 20, and 22, bilateral mandibular tori, bilateral maxillary hyperplastic tuberosities.  POSTOPERATIVE DIAGNOSES:  Multiple nonrestorable teeth, #5, 6, 8, 9, 10, 11, 12, 23, 25, 27, 26, 20, and 22, bilateral mandibular tori, bilateral maxillary hyperplastic tuberosities.  PROCEDURE:   Full-mouth extractions of above teeth, reduce bilateral maxillary tuberosities, and remove mandibular tori.  ANESTHESIA:  Georgia Lopes, D.M.D.  ANESTHESIA:  Lestine Box, M.D., general oral.  DESCRIPTION OF PROCEDURE:  The patient was taken to the operating room and placed on the table in supine position.  General anesthesia was administered intravenously.  An oral endotracheal tube was placed in the mouth.  The eyes were taped, and gauze pads were placed over them.  Then the patient was prepped and draped, the table was turned, and then a bite block was placed in the mouth.  Carbocaine 3% with no epinephrine was administered as an inferior alveolar block on the right left sides and buccal and palatal infiltration around the maxilla, a a total of 11 Carpules at 1.8 cc per Carpule were used. Then using a #15 blade, a full-thickness incision was made around teeth #20, 23, 22, and the other teeth in the mandible on the buccal and lingual side.  A #15 blade was used to make a full-thickness incision, then a drill with a 702 bur was used to section the bridge between teeth #20 and 22.  Then the lower teeth were removed with dental elevators and with forceps, then sockets were curetted, irrigated, and then the reflection was  carried lingually to expose the mandibular tori.  This was removed with an _____ bur and copiously irrigated.  Alveoloplasty was performed, and then 3-0 chromic was used to suture the left mandible.  In the maxilla on the left side, a #15 blade was used to make an elliptical incision of the fibrous tuberosity.  It was removed and then sutured with 3-0 chromic.  Then the remaining maxillary teeth had an incision around the sulcus next to these teeth.  A periosteal elevator was used to reflect the periosteum and then the teeth removed with a 301 elevator, dental forceps.  Sockets were curetted.  The alveolus was trimmed with an _____ bur, and then the area was closed with 3-0 chromic.  The bite block was repositioned and attention was turned to the left mandible.  The incision was carried onto the buccal crest to the area of approximately the first molar. Reflection was carried out buccally and lingually.  The remaining teeth were removed on this side, and the bone was smoothed with an _____ bur, and the torus was removed with an _____ bur also.  Then the area was irrigated and closed with 3-0 chromic.  In the right maxilla, a 15 blade was used to resect the hyperplastic tuberosity using an elliptical incision.  The tissue was removed.  Then the bone  was trimmed using an _____ bur.  The area was then irrigated and closed with 3-0 chromic.  Then the oral cavity was irrigated copiously, suctioned, and throat pack was removed.  Then gauze _____ were placed in the mouth and the patient was awakened, extubated, and taken to the recovery room, breathing spontaneously.  EBL: Minimum.  Complications:  None. Specimens:  None. DD:  01/26/00 TD:  01/30/00 Job: [id number]

## 2011-01-07 NOTE — Discharge Summary (Signed)
[name], [name]               ACCOUNT NO.:  [id number]   MEDICAL RECORD NO.:  [id number]          PATIENT TYPE:  INP   LOCATION:  6533                         FACILITY:  [facility]   PHYSICIAN:  [name] Friends. Dietrich Pates, MD, FACCDATE OF BIRTH:  07-18-1951   DATE OF ADMISSION:  04/06/2006  DATE OF DISCHARGE:  04/07/2006                                 DISCHARGE SUMMARY   PRIMARY CARDIOLOGIST:  [name], M.D.   PRIMARY CARE PHYSICIAN:  [name], M.D.   PRINCIPAL DIAGNOSIS:  Coronary artery disease   SECONDARY DIAGNOSES:  1. Hyperlipidemia.  2. Gastroesophageal reflux disease.  3. History of Wolff-Parkinson-White syndrome, status post failed      radiofrequency ablation.  4. Ongoing tobacco abuse.   ALLERGIES:  1. CODEINE.  2. DIGOXIN.  3. DILTIAZEM.  4. VERAPAMIL.   PROCEDURE:  PCI and stenting of the saphenous vein graft to the left  circumflex, with placement of 2.5 x 16 mm and 3 x 24 mm Taxus drug-eluting  stents.   HISTORY OF PRESENT ILLNESS:  A 60 year old male with prior history of CAD,  status post CABG in 1995.  He was recently seen by Dr. Vida [name] in  clinic on March 17, 2006 with complaints of chest discomfort, with subsequent  Myoview study revealing fixed defect in the inferior wall and reversible  defect in the anterior septum.  For that reason, he underwent left heart  cardiac catheterization on April 03, 2006 which retrieved a 99% lesion in  the saphenous vein graft to the left circumflex.  He presented back to the  catheterization lab on April 06, 2006 for PCI.   HOSPITAL COURSE:  He underwent successful PCI of the vein graft to the left  circumflex, with placement of a 2.5 x 16 mm Taxus drug-eluting stent, as  well as 3 x 24 mm Taxus drug-eluting stent.  He tolerated this procedure  well, and postprocedure his EKG has remained stable, without any acute  changes.  He is being discharged home today in satisfactory condition.   DISCHARGE  LABORATORIES:  Hemoglobin 14.9, hematocrit 43.6, WBC 7.2,  platelets 211, MCV 91.7.  Sodium 41, potassium 3.9, chloride 108, CO2 29,  BUN 9, creatinine 1.1, glucose 108, calcium 9.7.   DISPOSITION:  The patient is being discharged home today in good condition.   FOLLOW-UP PLANS AND APPOINTMENTS:  He has a follow-up appointment with Dr.  Vida [name] on April 19, 2006 at 1:00 p.m.  He is asked to follow up  with his primary care physician, Dr. Nobie [name], in 3-4 weeks.   DISCHARGE MEDICATIONS:  1. Enteric coated aspirin 325 mg daily.  2. Plavix 75  mg daily.  3. Metoprolol 25 mg b.i.d.  4. Altace 2.5 mg daily.  5. Protonix 4 mg daily.  6. Lipitor 80 mg at bedtime.  7. TriCor 145 mg daily.  8. Nitroglycerin 0.4 mg sublingual p.r.n. chest pain.  9. Fish oil 2 tablets b.i.d.   OUTSTANDING LABORATORY STUDIES:  None.   DURATION OF DISCHARGE ENCOUNTER:  35 minutes, including physician time,     ______________________________  [name] Deer  Brion [name], ANP      [name] Friends. Dietrich Pates, MD, [facility]  Electronically Signed    CB/MEDQ  D:  04/07/2006  T:  04/07/2006  Job:  [id number]   cc:   [name], M.D.

## 2011-01-07 NOTE — Discharge Summary (Signed)
[name], [name]               ACCOUNT NO.:  [id number]   MEDICAL RECORD NO.:  [id number]          PATIENT TYPE:  INP   LOCATION:  2005                         FACILITY:  [facility]   PHYSICIAN:  Arvilla Meres, M.D. LHCDATE OF BIRTH:  June 16, 1951   DATE OF ADMISSION:  02/20/2005  DATE OF DISCHARGE:  02/24/2005                                 DISCHARGE SUMMARY   ADMISSION DIAGNOSIS:  Unstable angina.   SECONDARY DIAGNOSIS:  Wolff-Parkinson-White disease.   PROCEDURES PERFORMED DURING THIS HOSPITALIZATION:  1.  Left heart catheterization with coronaries and bypass graft on February 21, 2005.  2.  Abdominal ultrasound on February 25, 2005.   SECONDARY DIAGNOSES:  1.  Mild ablation of a Wolff-Parkinson-White syndrome approximately 10 years      ago.  2.  Bladder surgery in the past.  3.  History of hyperlipidemia.  4.  Noncompliance with medications.   HISTORY OF PRESENT ILLNESS:  The patient is a 60 year old Caucasian male  with known coronary artery disease, status post bypass surgery in 1995.  The  patient also has a history of a failed catheter ablation approximately 10  years ago for Wolff-Parkinson-White disease.  The patient was admitted for  substernal chest tightness, shortness of breath and to rule out MI.   HOSPITAL COURSE:  The patient was started on heparin, Integrilin and aspirin  with positive CK-MB and troponin elevations.  On February 21, 2005, a left heart  catheterization with coronary-to-femoral bypass graft was obtained  for a  non-ST-segment MI.  The patient's positive MI was found to be occlusion of  the SVG to the RCA.  The graft was very small.  It was decided to treat the  patient medically.  On February 22, 2005, the patient did not complain of any  more chest wall pain.  On February 23, 2005, the patient had some chest pain, but  was stable at that point.  The patient also had abdominal ultrasound on February 23, 2005.  Abdominal ultrasound showed no gallstones, biliary duct  dilatation  and mild hepatomegaly.  On February 24, 2005, it was deemed that the patient was  medically stable to be discharged to home.   DISCHARGE LABORATORY DATA:  The patient was discharged with the following  lab results:  White blood cell count 9.3, hemoglobin of 14.3, hematocrit of  42.0, platelet count of 214,000; sodium of 137, potassium 4.3, chloride 101,  CO2 of 27, glucose of 95, BUN of 8, creatinine of 0.96.   DISCHARGE MEDICATIONS:  The patient was also discharged on the following  medications:  1.  Aspirin 325 mg  p.o. daily, enteric-coated.  2.  Protonix 40 mg p.o. daily.  3.  Plavix 75 mg p.o. daily.  4.  Lipitor 40 mg p.o. nightly.  5.  Lopressor 25 mg b.i.d.   DIET:  She was also instructed to eat a heart-healthy, low-fat, low-  cholesterol diet.   ACTIVITY:  The patient was also instructed to not lift any more than 10  pounds for a week.   SPECIAL DISCHARGE INSTRUCTIONS:  The patient was also strongly encouraged to  stop smoking.   FOLLOWUP:  The patient will have a followup with Dr. Dorethea Clan in [place],  Thursday, March 03, 2005, at 2:15.     ______________________________  April Humphrey, NP      Arvilla Meres, M.D. [facility]  Electronically Signed    AH/MEDQ  D:  04/18/2005  T:  04/19/2005  Job:  [id number]

## 2011-01-07 NOTE — H&P (Signed)
NAMETYEE, VANDEVOORDE               ACCOUNT NO.:  [id number]   MEDICAL RECORD NO.:  [id number]          PATIENT TYPE:  INP   LOCATION:  2926                         FACILITY:  [facility]   PHYSICIAN:  Doylene Canning. Ladona Ridgel, M.D.  DATE OF BIRTH:  1951/03/16   DATE OF ADMISSION:  02/20/2005  DATE OF DISCHARGE:                                HISTORY & PHYSICAL   ADMISSION DIAGNOSIS:  Unstable angina.   SECONDARY DIAGNOSIS:  Wolff-Parkinson White Syndrome.   HISTORY OF PRESENT ILLNESS:  The patient is a 60 year old man with known  coronary artery disease status post bypass surgery in 1995. He also has a  history of WPW syndrome and underwent failed catheter ablation approximately  10 years ago. The patient's palpitations have been fairly well controlled  recently. He has had chest pain off and on for several months, but over the  last several weeks and particularly over the last day or two, his symptoms  have worsened. He has had substernal chest tightness, shortness of breath,  and these were not improved with rest. The patient is admitted for  additional evaluation. He also notes generalized fatigue and weakness. The  pain is described as substernal but does not radiate. He has had some nausea  and vomiting, particularly in the last 12 hours. With nitroglycerin his pain  is now resolved.   PAST MEDICAL HISTORY:  Is as previously noted. He also has a history of  bladder surgery in the past. He has had a history of hyperlipidemia in the  past. He is noncompliant with his medications.   SOCIAL HISTORY:  The patient lives in [place]. He is disabled. Continues  to smoke cigarettes, smoking between one and two pack per day. He has done  so for over 30 years. He denies alcohol abuse.   FAMILY HISTORY:  Notable for a mother deceased at age 41 of Alzheimer's, and  a father deceased at age 33 from a MI.   REVIEW OF SYSTEMS:  Notable for generalized fatigue. He denies fevers,  chills or night  sweats. He denies vision or hearing problems. He does have  dentures throughout. He denies skin rashes or lesions. He denies  claudication of peripheral edema. He does have a remote history of syncope  but none in the last year. Denies orthopnea or paroxysmal nocturnal dyspnea  but does have palpitations, chest pain, shortness of breath. He denies  dysuria, hematuria, nocturia.  He denies weakness, numbness but does have some depression and anxiety  problems. He has arthralgias in his hands and wrist. He denies melena,  hematemesis, or reflux. He does admit to nausea and vomiting. He denies  polyuria, polydipsia, heat or cold intolerance.   PHYSICAL EXAMINATION:  GENERAL:  He is a pleasant 60 year old man in no  distress.  VITAL SIGNS:  Blood pressure was 95/50, pulse 65 and regular, respirations  are 16.  Temperature was 98.  HEENT EXAM:  Normocephalic and atraumatic. Pupils are equal, round.  Oropharynx was moist. The sclerae were anicteric. He was edentulous.  NECK:  Revealed no jugular venous distension, there is no thyromegaly. The  trachea was midline.  CARDIOVASCULAR:  Regular rate and rhythm with normal S1 and S2. There are no  murmurs, rubs, or gallops. The PMI was not displaced.  LUNGS:  Clear bilaterally to auscultation, there are no wheezes, rales or  rhonchi.  SKIN:  Revealed no rashes or lesions.  ABDOMEN:  Soft, nontender, nondistended, there is no organomegaly.  EXTREMITIES:  Demonstrated no cyanosis, clubbing or edema. The pulses were  2+ and symmetric.  MUSCULOSKELETAL:  Exam was normal.  NEUROLOGIC EXAM: Alert and oriented x3 with cranial nerves intact. Strength  was 5/5 and symmetric.   His EKG demonstrates sinus rhythm with WPW syndrome. Initial cardiac enzymes  were notable for a CK-MB of 24. His troponin was 1.9.   IMPRESSION:  1.  Unstable angina/non Q wave myocardial infarction.  2.  WPW syndrome status post failed ablation with fairly minimal symptoms  at      present.  3.  Ongoing tobacco use.   DISCUSSION:  Plan to admit the patient to the hospital, obtain serial  cardiac enzymes. Proceed with left heart catheterization and observe for  supraventricular tachycardia on telemetry.       GWT/MEDQ  D:  02/20/2005  T:  02/20/2005  Job:  [id number]   cc:   Patrica Duel, M.D.  [address], [address]  [place]  [place] [address]  Fax: [phone]

## 2011-01-07 NOTE — Cardiovascular Report (Signed)
NAMEGIACOMO, [name]               ACCOUNT NO.:  [id number]   MEDICAL RECORD NO.:  [id number]          PATIENT TYPE:  INP   LOCATION:  2926                         FACILITY:  [facility]   PHYSICIAN:  Salvadore Farber, M.D. LHCDATE OF BIRTH:  [birth year]   DATE OF PROCEDURE:  02/21/2005  DATE OF DISCHARGE:                              CARDIAC CATHETERIZATION   PROCEDURE:  Left heart catheterization, left ventriculography, coronary  artery and bypass grafting angiography.   INDICATIONS:  Mr. Andujo is a 60 year old gentleman who is status post  coronary artery bypass grafting in 1995.  He now presents with non-ST  elevation myocardial infarction.  Electrocardiogram is uninterpretable due  to pre-excitation.  He is referred for diagnostic angiography and possible  percutaneous coronary intervention.   PROCEDURAL TECHNIQUE:  Informed consent was obtained.  Under 1% lidocaine  local anesthesia, a 5 French sheath was placed in the right common femoral  artery using the modified Seldinger technique.  Diagnostic angiography and  ventriculography were performed using JL4 and JR4 catheters to the native  coronaries, JR4 for each vein graft, JR4 for the subclavian, and LIMA  catheter for the left internal mammary artery.  A pigtail catheter was used  for left heart catheterization and ventriculography.  The patient tolerated  the procedure well and was transferred to the holding room in stable  condition.   COMPLICATIONS:  None.   FINDINGS:  1.  LV 108/4/11.  EF 60% with posterior basal akinesis.  2.  No aortic stenosis.  There is 1-2+ mitral regurgitation, which appears      to be due to ventricular ectopy.  3.  Left main is angiographically normal.  4.  LAD 99% stenosis in the proximal vessel.  The sequential left internal      mammary artery graft to the diagonal and mid LAD is widely patent.  The      operative report states it is anastomosed to another diagonal.  This      does not appear  to be the case, based on the angiogram.  5.  Circumflex:  The vessel is occluded proximally.  The sequential      saphenous vein graft to OM I and OM II is widely patent.  6.  RCA:  Vessel is occluded proximally.  The saphenous vein graft to the      RCA is occluded.  This vein graft occlusion appears to be the culprit      for his acute coronary syndrome.  7.  Left subclavian:  Normal vessel.   IMPRESSION/RECOMMENDATIONS:  His myocardial infarction appears to have been  caused by occlusion of the saphenous vein graft to the right coronary  artery.  This graft is fairly small in diameter and has what appears to be a  very long total occlusion; therefore, we will treat it medically.       WED/MEDQ  D:  02/21/2005  T:  02/21/2005  Job:  [id number]   cc:   Vida Roller, M.D.  Fax: [phone]   Patrica Duel, M.D.  [address]  [place]  [place] [address]  Fax: [phone]

## 2011-01-07 NOTE — Assessment & Plan Note (Signed)
[facility]                         [place] CARDIOLOGY OFFICE NOTE   NAME:[name], [name] KIRBY                      MRN:          [id number]  DATE:03/17/2006                            DOB:          08-27-1950    [name] returns today.  He has recently been hospitalized, originally at  [facility] but then at [facility], for chest discomfort.  He ruled  out for myocardial infarction.  He had a perfusion study which,  unfortunately, showed mildly depressed LV systolic function with evidence of  a fixed defect in the inferior wall and a reversible defect in the anterior  septum.  For reasons that are not clear to me, he was sent home without  having a heart catheterization and treated medically.   He is currently on the following medications:  1.  Toprol-XL 50 once a day.  2.  Lipitor 40 once a day.  3.  Plavix 75 mg once a day.  4.  TriCor 145 once a day.  5.  Aspirin 81 mg a day.  6.  Protonix 40 mg a day.   He continues to have episodic discomfort in his chest which is related to  exertion.  He has a history of coronary disease status post bypass surgery  in 1995 with a LIMA to his LAD, a saphenous vein graft to his obtuse  marginal 1 and 2 and a saphenous vein graft to his right coronary artery.  He had a heart catheterization last year which showed preserved LV systolic  function, occlusion of his proximal circumflex and severe native vessel  disease.  His LIMA to his LAD was patent.  His saphenous vein graft to his  obtuse marginals were patent.  His saphenous vein graft to his right  coronary artery was occluded.  He also has a history of Wolff-Parkinson-  White status post unsuccessful ablation.   On physical exam today, his blood pressure is 128/90, pulse is 72, he weighs  176 pounds.  CHEST:  Clear.  He has no jugular venous distention or carotid bruits.  CARDIOVASCULAR EXAM:  Regular with no murmur.  LOWER EXTREMITIES:   Without clubbing, cyanosis or edema.  He has no femoral  bruits.   Of note is the fact that on his rest stress Myoview his ejection fraction  was actually mildly depressed at 46%.  He did have some laboratories done  which include, these were done on the 24th of this month, a CBC which looks  fine, a comprehensive metabolic panel which also looks fine, his creatinine  is 1.2.  He had lipids drawn, showed a total of 151, triglycerides of 143,  LDL is 90 and his HDL is 32.  Thyroid function studies are normal and his  PSA is normal.  So, I talked with [name] and I thought it was probably  appropriate that he have a heart catheterization.  We will get this done in  our [facility].  He is uncertain of the timing, he has got some family issues he  needs to deal with, and so I am going to  add a low dose of Altace 2.5 mg to  his medical regimen due to the depressed LV systolic function and the mild  diastolic heart  failure and we will see him back to get him scheduled for his heart  catheterization once his family issues have been addressed.                                   [name] Friends. Dietrich Pates, MD, [facility]   RMR/MedQ  DD:  03/17/2006  DT:  03/17/2006  Job #:  [id number]   cc:   Patrica Duel, MD
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
## 2011-02-21 ENCOUNTER — Other Ambulatory Visit: Payer: Self-pay | Admitting: Cardiology

## 2011-02-22 ENCOUNTER — Other Ambulatory Visit: Payer: Self-pay | Admitting: Cardiology

## 2011-03-24 ENCOUNTER — Other Ambulatory Visit: Payer: Self-pay | Admitting: Cardiovascular Disease

## 2011-04-22 ENCOUNTER — Other Ambulatory Visit: Payer: Self-pay | Admitting: Cardiovascular Disease

## 2011-06-23 ENCOUNTER — Other Ambulatory Visit: Payer: Self-pay | Admitting: Cardiology

## 2011-08-24 ENCOUNTER — Other Ambulatory Visit: Payer: Self-pay | Admitting: Cardiology

## 2011-10-21 ENCOUNTER — Other Ambulatory Visit: Payer: Self-pay | Admitting: Cardiovascular Disease

## 2011-10-31 ENCOUNTER — Ambulatory Visit: Payer: Medicare Other | Admitting: Cardiology

## 2011-11-04 ENCOUNTER — Ambulatory Visit (INDEPENDENT_AMBULATORY_CARE_PROVIDER_SITE_OTHER): Payer: Medicare Other | Admitting: Cardiology

## 2011-11-04 ENCOUNTER — Encounter: Payer: Self-pay | Admitting: Cardiology

## 2011-11-04 VITALS — BP 125/81 | HR 64 | Resp 16 | Ht 68.0 in | Wt 169.0 lb

## 2011-11-04 DIAGNOSIS — I10 Essential (primary) hypertension: Secondary | ICD-10-CM

## 2011-11-04 DIAGNOSIS — E785 Hyperlipidemia, unspecified: Secondary | ICD-10-CM

## 2011-11-04 DIAGNOSIS — I2510 Atherosclerotic heart disease of native coronary artery without angina pectoris: Secondary | ICD-10-CM

## 2011-11-04 DIAGNOSIS — F17200 Nicotine dependence, unspecified, uncomplicated: Secondary | ICD-10-CM

## 2011-11-04 DIAGNOSIS — I456 Pre-excitation syndrome: Secondary | ICD-10-CM

## 2011-11-04 NOTE — Progress Notes (Signed)
   Clinical Summary Mr. Pak is a 61 y.o.male presenting for followup. He was seen in April of 2012, missed his planned subsequent visit at six months.  He reports chronic dyspnea on exertion at NYHA class III, no progressive chest pain. He has also had intermittent palpitations. No syncope.  He reports compliance with his medications. He has had no followup labwork since last visit. Also no primary care visits.  He continues to smoke and has not been able to quit.  I reviewed his ECG today. We also discussed the progressive nature of CAD, and in light of his described symptoms, I outlined followup testing to consdier. He stated that he did not want to pursue any testing now.  He also voiced frustration with having prescription refills declined in situations where he had not kept office followup. We discussed the policy and rationale.  No Known Allergies  Current Outpatient Prescriptions  Medication Sig Dispense Refill  . ALTACE 2.5 MG capsule TAKE (1) CAPSULE BY MOUTH ONCE DAILY.  30 each  6  . aspirin 325 MG tablet Take 325 mg by mouth daily.        Marland Kitchen LIPITOR 80 MG tablet TAKE 1 TABLET BY MOUTH ONCE DAILY FOR CHOLESTEROL.  30 each  6  . metoprolol tartrate (LOPRESSOR) 25 MG tablet TAKE (1) TABLET BY MOUTH TWICE DAILY.  180 tablet  0  . PLAVIX 75 MG tablet TAKE ONE TABLET BY MOUTH ONCE DAILY.  30 each  6  . PROTONIX 40 MG tablet TAKE 1 TABLET BY MOUTH ONCE DAILY FOR ACID REFLUX.  30 each  6  . TRICOR 145 MG tablet TAKE 1 TABLET BY MOUTH ONCE DAILY FOR CHOLESTEROL.  30 each  6    Past Medical History  Diagnosis Date  . Wolff-Parkinson-White (WPW) syndrome     Failed ablation  . Coronary artery disease     Multivessel, LVEF 50%, occluded SVG to RCA, DES SVG to OM system 2007  . Hyperlipidemia   . Hypertension   . Noncompliance     Past Surgical History  Procedure Date  . Coronary artery bypass graft 1995    LIMA to LAD , SVG to diagonal, SVG to OM1 and OM2 , SVG to RCA   .  Bladder surgery     Social History Mr. Blanchette reports that he has been smoking Cigarettes.  He has never used smokeless tobacco. Mr. Moan reports that he does not drink alcohol.  Review of Systems Outlined above. He denies any further hematochezia. Otherwise negative.  Physical Examination Filed Vitals:   11/04/11 1406  BP: 125/81  Pulse: 64  Resp: 16   Overweight male in no acute distress. No active chest pain.  HEENT: Conjunctiva and lids normal, oropharynx with poor dentition.  Neck: Supple, no JVP or thyromegaly.  Lungs: Coarse, diminished breath sounds, nonlabored, no wheezing.  Cardiac: Regular rate and rhythm, soft midsystolic murmur at the apex no S3.  Abdomen: Soft, nontender, bowel sounds present.  Skin: Warm and dry.  Extremities: No pitting edema.  Neuropsychiatric: Alert and oriented x3, affect grossly appropriate.   ECG Sinus rhythm with old inferior infarct pattern.    Problem List and Plan

## 2011-11-04 NOTE — Patient Instructions (Signed)
**Note De-identified Mitchell Iwanicki Obfuscation** Your physician recommends that you continue on your current medications as directed. Please refer to the Current Medication list given to you today.  Your physician recommends that you schedule a follow-up appointment in: 1 year  

## 2011-11-04 NOTE — Assessment & Plan Note (Signed)
Continue medical and observation. We discussed possible followup cardiac testing for ischemic and structural assessment, although he did not want to proceed with any testing now. He stated that he preferred an annual visit and this was scheduled. We discussed warning signs.

## 2011-11-04 NOTE — Assessment & Plan Note (Signed)
Quiescent at this time

## 2011-11-04 NOTE — Assessment & Plan Note (Signed)
Blood pressure looks good today. 

## 2011-11-04 NOTE — Assessment & Plan Note (Signed)
He has had no followup labwork. I recommended this and he stated that he preferred to schedule labwork with full physical with primary MD. I offered for our office to make him an appointment with Dr. Regino Schultze, and he declined.

## 2011-11-04 NOTE — Assessment & Plan Note (Signed)
I continue to address smoking cessation with him.

## 2011-11-21 ENCOUNTER — Other Ambulatory Visit: Payer: Self-pay | Admitting: Cardiology

## 2012-01-21 ENCOUNTER — Other Ambulatory Visit: Payer: Self-pay | Admitting: Cardiology

## 2012-05-23 ENCOUNTER — Other Ambulatory Visit: Payer: Self-pay | Admitting: Cardiovascular Disease

## 2012-06-22 ENCOUNTER — Other Ambulatory Visit: Payer: Self-pay | Admitting: Cardiology

## 2012-06-28 ENCOUNTER — Emergency Department (HOSPITAL_COMMUNITY): Payer: Medicare Other

## 2012-06-28 ENCOUNTER — Encounter (HOSPITAL_COMMUNITY): Payer: Self-pay | Admitting: *Deleted

## 2012-06-28 ENCOUNTER — Observation Stay (HOSPITAL_COMMUNITY)
Admission: EM | Admit: 2012-06-28 | Discharge: 2012-06-28 | Disposition: A | Discharge status: Home / Self Care | Payer: Medicare Other | Attending: Internal Medicine | Admitting: Internal Medicine

## 2012-06-28 DIAGNOSIS — R0602 Shortness of breath: Secondary | ICD-10-CM

## 2012-06-28 DIAGNOSIS — I2510 Atherosclerotic heart disease of native coronary artery without angina pectoris: Secondary | ICD-10-CM | POA: Diagnosis present

## 2012-06-28 DIAGNOSIS — R112 Nausea with vomiting, unspecified: Secondary | ICD-10-CM | POA: Insufficient documentation

## 2012-06-28 DIAGNOSIS — K219 Gastro-esophageal reflux disease without esophagitis: Secondary | ICD-10-CM | POA: Diagnosis present

## 2012-06-28 DIAGNOSIS — I059 Rheumatic mitral valve disease, unspecified: Secondary | ICD-10-CM

## 2012-06-28 DIAGNOSIS — R079 Chest pain, unspecified: Secondary | ICD-10-CM | POA: Diagnosis present

## 2012-06-28 DIAGNOSIS — K921 Melena: Secondary | ICD-10-CM

## 2012-06-28 DIAGNOSIS — I10 Essential (primary) hypertension: Secondary | ICD-10-CM | POA: Insufficient documentation

## 2012-06-28 DIAGNOSIS — R55 Syncope and collapse: Principal | ICD-10-CM | POA: Diagnosis present

## 2012-06-28 DIAGNOSIS — I456 Pre-excitation syndrome: Secondary | ICD-10-CM | POA: Diagnosis present

## 2012-06-28 DIAGNOSIS — E785 Hyperlipidemia, unspecified: Secondary | ICD-10-CM | POA: Insufficient documentation

## 2012-06-28 DIAGNOSIS — K5289 Other specified noninfective gastroenteritis and colitis: Secondary | ICD-10-CM | POA: Insufficient documentation

## 2012-06-28 DIAGNOSIS — F17200 Nicotine dependence, unspecified, uncomplicated: Secondary | ICD-10-CM | POA: Diagnosis present

## 2012-06-28 LAB — POCT I-STAT TROPONIN I: Troponin i, poc: 0.01 ng/mL (ref 0.00–0.08)

## 2012-06-28 LAB — BASIC METABOLIC PANEL
BUN: 9 mg/dL (ref 6–23)
Chloride: 102 mEq/L (ref 96–112)
GFR calc non Af Amer: 76 mL/min — ABNORMAL LOW (ref >90)
Glucose, Bld: 127 mg/dL — ABNORMAL HIGH (ref 70–99)
Potassium: 3.2 mEq/L — ABNORMAL LOW (ref 3.5–5.1)
Sodium: 136 mEq/L (ref 135–145)

## 2012-06-28 LAB — CBC
HCT: 36.6 % — ABNORMAL LOW (ref 39.0–52.0)
Hemoglobin: 12.4 g/dL — ABNORMAL LOW (ref 13.0–17.0)
RBC: 3.69 MIL/uL — ABNORMAL LOW (ref 4.22–5.81)
WBC: 2.9 10*3/uL — ABNORMAL LOW (ref 4.0–10.5)

## 2012-06-28 LAB — MAGNESIUM: Magnesium: 1.8 mg/dL (ref 1.5–2.5)

## 2012-06-28 MED ORDER — ONDANSETRON HCL 4 MG/2ML IJ SOLN: 4.0000 mg | Freq: Four times a day (QID) | INTRAMUSCULAR | Status: DC | PRN

## 2012-06-28 MED ORDER — ACETAMINOPHEN 650 MG RE SUPP: 650.0000 mg | Freq: Four times a day (QID) | RECTAL | Status: DC | PRN

## 2012-06-28 MED ORDER — CLOPIDOGREL BISULFATE 75 MG PO TABS
75.0000 mg | ORAL_TABLET | Freq: Every day | ORAL | Status: DC
  Administered 2012-06-28: 75 mg via ORAL
  Filled 2012-06-28: qty 1

## 2012-06-28 MED ORDER — ONDANSETRON HCL 4 MG/2ML IJ SOLN
4.0000 mg | Freq: Once | INTRAMUSCULAR | Status: AC
  Administered 2012-06-28: 4 mg via INTRAVENOUS
  Filled 2012-06-28: qty 2

## 2012-06-28 MED ORDER — ALBUTEROL SULFATE (5 MG/ML) 0.5% IN NEBU: 2.5000 mg | INHALATION_SOLUTION | RESPIRATORY_TRACT | Status: DC | PRN

## 2012-06-28 MED ORDER — SODIUM CHLORIDE 0.9 % IJ SOLN
3.0000 mL | Freq: Two times a day (BID) | INTRAMUSCULAR | Status: DC
  Administered 2012-06-28: 3 mL via INTRAVENOUS
  Filled 2012-06-28: qty 3

## 2012-06-28 MED ORDER — ACETAMINOPHEN 325 MG PO TABS: 650.0000 mg | ORAL_TABLET | Freq: Four times a day (QID) | ORAL | Status: DC | PRN

## 2012-06-28 MED ORDER — POTASSIUM CHLORIDE CRYS ER 20 MEQ PO TBCR
40.0000 meq | EXTENDED_RELEASE_TABLET | Freq: Once | ORAL | Status: AC
  Administered 2012-06-28: 40 meq via ORAL
  Filled 2012-06-28: qty 2

## 2012-06-28 MED ORDER — ATORVASTATIN CALCIUM 40 MG PO TABS
80.0000 mg | ORAL_TABLET | Freq: Every day | ORAL | Status: DC
  Filled 2012-06-28: qty 1

## 2012-06-28 MED ORDER — METOPROLOL TARTRATE 25 MG PO TABS
25.0000 mg | ORAL_TABLET | Freq: Two times a day (BID) | ORAL | Status: DC
  Administered 2012-06-28: 25 mg via ORAL
  Filled 2012-06-28: qty 1

## 2012-06-28 MED ORDER — NICOTINE 14 MG/24HR TD PT24
14.0000 mg | MEDICATED_PATCH | Freq: Every day | TRANSDERMAL | Status: DC
  Administered 2012-06-28: 14 mg via TRANSDERMAL
  Filled 2012-06-28: qty 1

## 2012-06-28 MED ORDER — ASPIRIN 325 MG PO TABS
325.0000 mg | ORAL_TABLET | Freq: Every day | ORAL | Status: DC
  Filled 2012-06-28: qty 1

## 2012-06-28 MED ORDER — PANTOPRAZOLE SODIUM 40 MG PO TBEC
40.0000 mg | DELAYED_RELEASE_TABLET | Freq: Every day | ORAL | Status: DC
  Administered 2012-06-28: 40 mg via ORAL
  Filled 2012-06-28: qty 1

## 2012-06-28 MED ORDER — RAMIPRIL 2.5 MG PO CAPS
2.5000 mg | ORAL_CAPSULE | Freq: Every day | ORAL | Status: DC
  Administered 2012-06-28: 2.5 mg via ORAL
  Filled 2012-06-28: qty 1

## 2012-06-28 MED ORDER — ENOXAPARIN SODIUM 40 MG/0.4ML ~~LOC~~ SOLN
40.0000 mg | SUBCUTANEOUS | Status: DC
  Filled 2012-06-28: qty 0.4

## 2012-06-28 MED ORDER — ONDANSETRON HCL 4 MG PO TABS: 4.0000 mg | ORAL_TABLET | Freq: Four times a day (QID) | ORAL | Status: DC | PRN

## 2012-06-28 NOTE — ED Notes (Signed)
Pt to department via EMS.  Per report, medics were called out due to pt "passing out".  Pt unsure if he really did or not.  Pt does report just feeling ill since last night.  Nausea, vomiting x1, decreased appetitive and intermitent chest pain.  Pt also reports that he may have intermittent fever.

## 2012-06-28 NOTE — Progress Notes (Signed)
*  PRELIMINARY RESULTS* Echocardiogram 2D Echocardiogram has been performed.  Conrad Decatur 06/28/2012, 10:16 AM

## 2012-06-28 NOTE — ED Notes (Signed)
No change 

## 2012-06-28 NOTE — ED Provider Notes (Signed)
History     CSN: [id number]  Arrival date & time 06/28/12  0037   First MD Initiated Contact with Patient 06/28/12 0127      Chief Complaint  Patient presents with  . Fever  . Loss of Consciousness    (Consider location/radiation/quality/duration/timing/severity/associated sxs/prior treatment) The history is provided by the patient and the EMS personnel.  PCM Dr Sherwood Gambler 61 year old male with syncope x 2, brought in by EMS. First episdoe at about 2000 associated with chest pain and then followed by vomiting. The chest pain lasted about 30 minutes. Second episode at about 2230 with chest pain and followed by vomiting. Pain lasted 15 minutes. No chest pain now. PMH of WPW. Felt like palpitaions and chest pain was 8/10. Felt sharp pressure. Started feeling ill last evening. Questionable fever.   Past Medical History  Diagnosis Date  . Wolff-Parkinson-White (WPW) syndrome     Failed ablation  . Coronary artery disease     Multivessel, LVEF 50%, occluded SVG to RCA, DES SVG to OM system 2007  . Hyperlipidemia   . Hypertension   . Noncompliance     Past Surgical History  Procedure Date  . Coronary artery bypass graft 1995    LIMA to LAD , SVG to diagonal, SVG to OM1 and OM2 , SVG to RCA   . Bladder surgery     History reviewed. No pertinent family history.  History  Substance Use Topics  . Smoking status: Current Every Day Smoker    Types: Cigarettes  . Smokeless tobacco: Never Used  . Alcohol Use: No      Review of Systems  Constitutional: Positive for fever and fatigue.  HENT: Negative for neck pain.   Eyes: Negative for visual disturbance.  Respiratory: Negative for shortness of breath.   Cardiovascular: Positive for chest pain.  Gastrointestinal: Positive for nausea and vomiting. Negative for abdominal pain.  Genitourinary: Negative for hematuria.  Musculoskeletal: Negative for back pain.  Skin: Negative for rash.  Neurological: Positive for syncope. Negative  for headaches.    Allergies  Codeine  Home Medications   Current Outpatient Rx  Name  Route  Sig  Dispense  Refill  . ALTACE 2.5 MG PO CAPS      TAKE (1) CAPSULE BY MOUTH ONCE DAILY.   30 each   12   . ASPIRIN 325 MG PO TABS   Oral   Take 325 mg by mouth daily.           Marland Kitchen LIPITOR 80 MG PO TABS      TAKE 1 TABLET BY MOUTH ONCE DAILY FOR CHOLESTEROL.   30 each   12   . METOPROLOL TARTRATE 25 MG PO TABS      TAKE (1) TABLET BY MOUTH TWICE DAILY.   60 tablet   6   . PLAVIX 75 MG PO TABS      TAKE ONE TABLET BY MOUTH ONCE DAILY.   90 tablet   3   . PROTONIX 40 MG PO TBEC      TAKE 1 TABLET BY MOUTH ONCE DAILY FOR ACID REFLUX.   30 each   12   . TRICOR 145 MG PO TABS      TAKE 1 TABLET BY MOUTH ONCE DAILY FOR CHOLESTEROL.   30 each   12     BP 132/85  Pulse 92  Temp 99 F (37.2 C)  Resp 13  Ht 5\' 8"  (1.727 m)  Wt 165 lb (74.844  kg)  BMI 25.09 kg/m2  SpO2 95%  Physical Exam  Nursing note and vitals reviewed. Constitutional: He is oriented to person, place, and time. He appears well-developed and well-nourished.  HENT:  Head: Normocephalic and atraumatic.  Eyes: Conjunctivae normal and EOM are normal. Pupils are equal, round, and reactive to light.  Neck: Normal range of motion. Neck supple.  Cardiovascular: Normal rate and regular rhythm.   Murmur heard. Pulmonary/Chest: Effort normal and breath sounds normal.  Abdominal: Soft. Bowel sounds are normal. There is no tenderness.  Musculoskeletal: Normal range of motion. He exhibits no edema.  Neurological: He is alert and oriented to person, place, and time. No cranial nerve deficit. He exhibits normal muscle tone. Coordination normal.  Skin: Skin is warm. No rash noted.    ED Course  Procedures (including critical care time)  Labs Reviewed  CBC - Abnormal; Notable for the following:    WBC 2.9 (*)     RBC 3.69 (*)     Hemoglobin 12.4 (*)     HCT 36.6 (*)     RDW 15.6 (*)     All other  components within normal limits  BASIC METABOLIC PANEL - Abnormal; Notable for the following:    Potassium 3.2 (*)     Glucose, Bld 127 (*)     GFR calc non Af Amer 76 (*)     GFR calc Af Amer 88 (*)     All other components within normal limits  TROPONIN I  POCT I-STAT TROPONIN I   Dg Chest Port 1 View  06/28/2012  *RADIOLOGY REPORT*  Clinical Data: Fever; loss of consciousness.  PORTABLE CHEST - 1 VIEW  Comparison: Chest radiograph performed 03/02/2006  Findings: The lungs are well-aerated and clear.  There is no evidence of focal opacification, pleural effusion or pneumothorax.  The cardiomediastinal silhouette is normal in size; the patient is status post median sternotomy.  No acute osseous abnormalities are seen.  IMPRESSION: No acute cardiopulmonary process seen.   Original Report Authenticated By: Tonia Ghent, M.D.     Date: 06/28/2012  Rate: 101  Rhythm: sinus tachycardia  QRS Axis: left  Intervals: normal  ST/T Wave abnormalities: nonspecific ST/T changes  Conduction Disutrbances:none  Narrative Interpretation:   Old EKG Reviewed: unchanged 04/07/06 Patient with history of WPW but no evidence on today's EKG.  Results for orders placed during the hospital encounter of 06/28/12  CBC      Component Value Range   WBC 2.9 (*) 4.0 - 10.5 K/uL   RBC 3.69 (*) 4.22 - 5.81 MIL/uL   Hemoglobin 12.4 (*) 13.0 - 17.0 g/dL   HCT 40.9 (*) 81.1 - 91.4 %   MCV 99.2  78.0 - 100.0 fL   MCH 33.6  26.0 - 34.0 pg   MCHC 33.9  30.0 - 36.0 g/dL   RDW 78.2 (*) 95.6 - 21.3 %   Platelets 168  150 - 400 K/uL  BASIC METABOLIC PANEL      Component Value Range   Sodium 136  135 - 145 mEq/L   Potassium 3.2 (*) 3.5 - 5.1 mEq/L   Chloride 102  96 - 112 mEq/L   CO2 28  19 - 32 mEq/L   Glucose, Bld 127 (*) 70 - 99 mg/dL   BUN 9  6 - 23 mg/dL   Creatinine, Ser 0.86  0.50 - 1.35 mg/dL   Calcium 9.7  8.4 - 57.8 mg/dL   GFR calc non Af Amer 76 (*) >90  mL/min   GFR calc Af Amer 88 (*) >90 mL/min    TROPONIN I      Component Value Range   Troponin I <0.30  <0.30 ng/mL  POCT I-STAT TROPONIN I      Component Value Range   Troponin i, poc 0.01  0.00 - 0.08 ng/mL   Comment 3           TSH      Component Value Range   TSH 1.351  0.350 - 4.500 uIU/mL  PRO B NATRIURETIC PEPTIDE      Component Value Range   Pro B Natriuretic peptide (BNP) 239.5 (*) 0 - 125 pg/mL  MAGNESIUM      Component Value Range   Magnesium 1.8  1.5 - 2.5 mg/dL  TROPONIN I      Component Value Range   Troponin I <0.30  <0.30 ng/mL      1. Syncope   2. Chest pain       MDM  DW hospitalist who will admit, for syncope x 2 associated with chest pain. No arrythmia in ED, work up with out evidence of acute cardiac event. Low WBC may suggest viral etiology to symptoms.         Shelda Jakes, MD 06/28/12 2044

## 2012-06-28 NOTE — ED Notes (Signed)
Hoping to go home tonight, states he feels better and he's worried about his dog at home.

## 2012-06-28 NOTE — Consult Note (Signed)
CARDIOLOGY CONSULT NOTE    Patient ID: [name] MRN: [id number] DOB/AGE: 59-Oct-1952 59 y.o.  Admit date: 06/28/2012 Referring Physician:  Karilyn Cota Primary Physician: Cassell Smiles., MD Primary Cardiologist:  Durenda Hurt and Graciela Husbands Reason for Consultation: "Syncope"  Principal Problem:  *Syncope Active Problems:  TOBACCO ABUSE  CORONARY ATHEROSCLEROSIS NATIVE CORONARY ARTERY  WOLFF (WOLFE)-PARKINSON-WHITE (WPW) SYNDROME  GERD  Chest pain   HPI:  61 yo with history of WPW failed ablation in mid 90's. Distant CABG with last cath 2007 Dr Samule Ohm placed overlaping DES to SVG OM.  EF has been around 45-50% Had some rotisserie chicken on Tuesday and has not felt well since.  Had multiple episodes of nausea vohmiting x2 and malaise for 48 hours.  No fever.  During illness passed out in setting of hot flash.  Did not hurt himself. No palpitations Did describe chest pressure after nausea and vohmiting.  Has not had clinical re-entrant tachycardia using bypass tract.  This am feels fine and wants to go home.  He indicates feeling similarly with food poisening a few years ago.  No dyspnea, diarhea, abdominal pain or fever.      @ROS @ All other systems reviewed and negative except as noted above  Past Medical History  Diagnosis Date  . Wolff-Parkinson-White (WPW) syndrome     Failed ablation  . Coronary artery disease     Multivessel, LVEF 50%, occluded SVG to RCA, DES SVG to OM system 2007  . Hyperlipidemia   . Hypertension   . Noncompliance     History reviewed. No pertinent family history.  History   Social History  . Marital Status: Divorced    Spouse Name: N/A    Number of Children: N/A  . Years of Education: N/A   Occupational History  . Not on file.   Social History Main Topics  . Smoking status: Current Every Day Smoker -- 1.0 packs/day for 40 years    Types: Cigarettes  . Smokeless tobacco: Never Used  . Alcohol Use: No  . Drug Use: No  . Sexually Active: Not  on file   Other Topics Concern  . Not on file   Social History Narrative  . No narrative on file    Past Surgical History  Procedure Date  . Coronary artery bypass graft 1995    LIMA to LAD , SVG to diagonal, SVG to OM1 and OM2 , SVG to RCA   . Bladder surgery         . aspirin  325 mg Oral Daily  . atorvastatin  80 mg Oral q1800  . clopidogrel  75 mg Oral Q breakfast  . enoxaparin (LOVENOX) injection  40 mg Subcutaneous Q24H  . metoprolol tartrate  25 mg Oral BID  . nicotine  14 mg Transdermal Daily  . [COMPLETED] ondansetron  4 mg Intravenous Once  . pantoprazole  40 mg Oral Daily  . [COMPLETED] potassium chloride  40 mEq Oral Once  . ramipril  2.5 mg Oral Daily  . sodium chloride  3 mL Intravenous Q12H      Physical Exam: Blood pressure 138/81, pulse 93, temperature 98.2 F (36.8 C), temperature source Oral, resp. rate 15, height 5\' 8"  (1.727 m), weight 177 lb 11.1 oz (80.6 kg), SpO2 92.00%.  Affect appropriate Healthy:  appears stated age HEENT: normal Neck supple with no adenopathy JVP normal no bruits no thyromegaly Lungs clear with no wheezing and good diaphragmatic motion Heart:  S1/S2 no murmur, no rub, gallop or click  PMI normal Abdomen: benighn, BS positve, no tenderness, no AAA no bruit.  No HSM or HJR Distal pulses intact with no bruits No edema Neuro non-focal Skin warm and dry No muscular weakness   Labs:   Lab Results  Component Value Date   WBC 2.9* 06/28/2012   HGB 12.4* 06/28/2012   HCT 36.6* 06/28/2012   MCV 99.2 06/28/2012   PLT 168 06/28/2012    Lab 06/28/12 0100  NA 136  K 3.2*  CL 102  CO2 28  BUN 9  CREATININE 1.05  CALCIUM 9.7  PROT --  BILITOT --  ALKPHOS --  ALT --  AST --  GLUCOSE 127*   Lab Results  Component Value Date   TROPONINI <0.30 06/28/2012    Lab Results  Component Value Date   CHOL 126 11/23/2010   CHOL 111 09/10/2008   Lab Results  Component Value Date   HDL 26* 11/23/2010   HDL 27 1/61/0960   Lab  Results  Component Value Date   LDLCALC 78 11/23/2010   LDLCALC 60 09/10/2008   Lab Results  Component Value Date   TRIG 111 11/23/2010   TRIG 119 09/10/2008   Lab Results  Component Value Date   CHOLHDL 4.8 11/23/2010   No results found for this basename: LDLDIRECT      Radiology: Dg Chest Port 1 View  06/28/2012  *RADIOLOGY REPORT*  Clinical Data: Fever; loss of consciousness.  PORTABLE CHEST - 1 VIEW  Comparison: Chest radiograph performed 03/02/2006  Findings: The lungs are well-aerated and clear.  There is no evidence of focal opacification, pleural effusion or pneumothorax.  The cardiomediastinal silhouette is normal in size; the patient is status post median sternotomy.  No acute osseous abnormalities are seen.  IMPRESSION: No acute cardiopulmonary process seen.   Original Report Authenticated By: Tonia Ghent, M.D.     EKG: NSR nonspecfic ST changes no pre-excitation   ASSESSMENT AND PLAN:  Syncope:  Does not sound cardiac  Echo if no dramatic changes ok to DC latter today.  Sounds most consistent with food poisoning WPW:  Stable with no pre-excitation on ECG  Not interested in further invasive w/u CAD:  Reasonable to F/U for outpatient myovue with Dr Diona Browner  Signed: Charlton Haws 06/28/2012, 8:40 AM

## 2012-06-28 NOTE — Discharge Summary (Signed)
Physician Discharge Summary  [name] ZOX:[id number] DOB: 18-Apr-1951 DOA: 06/28/2012  PCP: Cassell Smiles., MD  Admit date: 06/28/2012 Discharge date: 06/28/2012  Time spent: Less than 30 minutes  Recommendations for Outpatient Follow-up:  1. Follow with cardiology, Dr. Diona Browner. Patient will follow with the results of echocardiogram.  Discharge Diagnoses:  1. Syncope, likely vasovagal event secondary to gastroenteritis/food poisoning. 2. History of coronary artery disease and Wolff-Parkinson-White syndrome, quiescent.   Discharge Condition: Stable and improved.  Diet recommendation: Heart healthy diet.  Filed Weights   06/28/12 0046 06/28/12 0500  Weight: 74.844 kg (165 lb) 80.6 kg (177 lb 11.1 oz)    History of present illness:  This very pleasant 61 year old man presented to the hospital with symptoms of syncope. Please see initial history as outlined below: HPI: [name] is a 61 y.o. male with a past medical history of cardiac disease. WPW syndrome, who was in his usual state of health about 2 nights ago, when he started feeling unwell. He could not really elaborate much on his symptoms except to say that he was fatigued. Last night he had a chicken sandwich. He felt nauseous. He went to the living room and sat in his recliner. He felt faint, had chest pain, and then felt flushed, and then apparently passed out. His daughter was in the room with him. He woke up maybe after 2 minutes. He had an episode of vomiting and felt better. Thereafter, he went to the bathroom to get his medications and went back to the recliner. He, again felt flushed, and then again passed out. When he woke up he vomited and felt better. Daughter had called EMS, by then and the patient was brought into the hospital. Denies any chest pain currently. He said he had similar symptoms about 3 years ago, but he never came in to the hospital or went to the doctor. He denies any recent travel. He's felt  fatigued over the last couple days, but denies any chest pain. He did mention palpitations to the ED physician. He said he may have had fever at home. His history of acid reflux, but denies any active regurgitation. No history of seizure tonight or any urinary incontinence. No history of shortness of breath. No history of any focal weakness.  Hospital Course:  The patient overnight has done extremely well and feels back to his baseline. On the flexion of his history, I think he probably had a vasovagal event secondary to his nausea and vomiting from his gastroenteritis. Serial cardiac enzymes have been negative. He has been seen by cardiology, Dr. Eden Emms, who agrees that this is noncardiac. An echocardiogram apparently has been done and this will be read as an outpatient. We have made arrangements for him to followup with cardiology, Dr. Diona Browner in the next couple weeks.  Procedures:  Echocardiogram, report pending.   Consultations:  Cardiology, Dr. Charlton Haws.  Discharge Exam: Filed Vitals:   06/28/12 0400 06/28/12 0500 06/28/12 0504 06/28/12 0508  BP: 126/84 126/74 143/83 138/81  Pulse: 84 84 82 93  Temp:  98.2 F (36.8 C)    TempSrc:  Oral    Resp: 12 15    Height:  5\' 8"  (1.727 m)    Weight:  80.6 kg (177 lb 11.1 oz)    SpO2: 95% 92%      General: He looks systemically well. Cardiovascular: Heart sounds are present and normal with a soft systolic murmur, that does not radiate. Respiratory: Lung fields are clear. He  is alert and orientated. His abdomen is soft nontender.  Discharge Instructions  Discharge Orders    Future Appointments: Provider: Department: Dept Phone: Center:   07/11/2012 11:20 AM Jodelle Gross, NP [facility] [place] [phone] [id number]     Future Orders Please Complete By Expires   Diet - low sodium heart healthy      Increase activity slowly          Medication List     As of 06/28/2012 10:06 AM    TAKE these medications          ALTACE 2.5 MG capsule   Generic drug: ramipril   TAKE (1) CAPSULE BY MOUTH ONCE DAILY.      aspirin 325 MG tablet   Take 325 mg by mouth daily.      LIPITOR 80 MG tablet   Generic drug: atorvastatin   TAKE 1 TABLET BY MOUTH ONCE DAILY FOR CHOLESTEROL.      metoprolol tartrate 25 MG tablet   Commonly known as: LOPRESSOR   TAKE (1) TABLET BY MOUTH TWICE DAILY.      PLAVIX 75 MG tablet   Generic drug: clopidogrel   TAKE ONE TABLET BY MOUTH ONCE DAILY.      PROTONIX 40 MG tablet   Generic drug: pantoprazole   TAKE 1 TABLET BY MOUTH ONCE DAILY FOR ACID REFLUX.      TRICOR 145 MG tablet   Generic drug: fenofibrate   TAKE 1 TABLET BY MOUTH ONCE DAILY FOR CHOLESTEROL.           Follow-up Information    Follow up with Joni Reining, NP. On 07/11/2012. (11:20 am)    Contact information:   [address] [place], [place] [phone]           The results of significant diagnostics from this hospitalization (including imaging, microbiology, ancillary and laboratory) are listed below for reference.    Significant Diagnostic Studies: Dg Chest Port 1 View  06/28/2012  *RADIOLOGY REPORT*  Clinical Data: Fever; loss of consciousness.  PORTABLE CHEST - 1 VIEW  Comparison: Chest radiograph performed 03/02/2006  Findings: The lungs are well-aerated and clear.  There is no evidence of focal opacification, pleural effusion or pneumothorax.  The cardiomediastinal silhouette is normal in size; the patient is status post median sternotomy.  No acute osseous abnormalities are seen.  IMPRESSION: No acute cardiopulmonary process seen.   Original Report Authenticated By: Tonia Ghent, M.D.         Labs: Basic Metabolic Panel:  Lab 06/28/12 8469 06/28/12 0100  NA -- 136  K -- 3.2*  CL -- 102  CO2 -- 28  GLUCOSE -- 127*  BUN -- 9  CREATININE -- 1.05  CALCIUM -- 9.7  MG 1.8 --  PHOS -- --       CBC:  Lab 06/28/12 0100  WBC 2.9*  NEUTROABS --  HGB 12.4*    HCT 36.6*  MCV 99.2  PLT 168   Cardiac Enzymes:  Lab 06/28/12 0810 06/28/12 0154  CKTOTAL -- --  CKMB -- --  CKMBINDEX -- --  TROPONINI <0.30 <0.30     Basename 06/28/12 0456  PROBNP 239.5*         Signed:  GOSRANI,NIMISH C  [facility] 06/28/2012, 10:06 AM

## 2012-06-28 NOTE — Progress Notes (Signed)
UR Chart Review Completed  

## 2012-06-28 NOTE — Progress Notes (Signed)
Discharge Summary: a/o.vss. Saline lock removed. Telemetry d/c. No complaints of any distress. Discharge instructions given. Prescriptions given. Pt verbalized understanding of instructions. Left floor via wheelchair with nursing staff and family member.

## 2012-06-28 NOTE — H&P (Signed)
[facility] History and Physical  [name] ZOX:[id number] DOB: [birth year] DOA: 06/28/2012   PCP: Cassell Smiles., MD  Specialists: He sees Dr. Diona Browner, with cardiology  Chief Complaint: Passed out twice  HPI: [name] is a 61 y.o. male with a past medical history of cardiac disease. WPW syndrome, who was in his usual state of health about 2 nights ago, when he started feeling unwell. He could not really elaborate much on his symptoms except to say that he was fatigued. Last night he had a chicken sandwich. He felt nauseous. He went to the living room and sat in his recliner. He felt faint, had chest pain, and then felt flushed, and then apparently passed out. His daughter was in the room with him. He woke up maybe after 2 minutes. He had an episode of vomiting and felt better. Thereafter, he went to the bathroom to get his medications and went back to the recliner. He, again felt flushed, and then again passed out. When he woke up he vomited and felt better. Daughter had called EMS, by then and the patient was brought into the hospital. Denies any chest pain currently. He said he had similar symptoms about 3 years ago, but he never came in to the hospital or went to the doctor. He denies any recent travel. He's felt fatigued over the last couple days, but denies any chest pain. He did mention palpitations to the ED physician. He said he may have had fever at home. His history of acid reflux, but denies any active regurgitation. No history of seizure tonight or any urinary incontinence. No history of shortness of breath. No history of any focal weakness.  Home Medications: Prior to Admission medications   Medication Sig Start Date End Date Taking? Authorizing Provider  ALTACE 2.5 MG capsule TAKE (1) CAPSULE BY MOUTH ONCE DAILY. 01/21/12  Yes Kathlen Brunswick, MD  aspirin 325 MG tablet Take 325 mg by mouth daily.     Yes Historical Provider, MD  LIPITOR 80 MG tablet TAKE 1  TABLET BY MOUTH ONCE DAILY FOR CHOLESTEROL. 01/21/12  Yes Kathlen Brunswick, MD  metoprolol tartrate (LOPRESSOR) 25 MG tablet TAKE (1) TABLET BY MOUTH TWICE DAILY. 06/22/12  Yes Jonelle Sidle, MD  PLAVIX 75 MG tablet TAKE ONE TABLET BY MOUTH ONCE DAILY. 05/23/12  Yes Jonelle Sidle, MD  PROTONIX 40 MG tablet TAKE 1 TABLET BY MOUTH ONCE DAILY FOR ACID REFLUX. 01/21/12  Yes Kathlen Brunswick, MD  TRICOR 145 MG tablet TAKE 1 TABLET BY MOUTH ONCE DAILY FOR CHOLESTEROL. 01/21/12  Yes Kathlen Brunswick, MD    Allergies:  Allergies  Allergen Reactions  . Codeine     Past Medical History: Past Medical History  Diagnosis Date  . Wolff-Parkinson-White (WPW) syndrome     Failed ablation  . Coronary artery disease     Multivessel, LVEF 50%, occluded SVG to RCA, DES SVG to OM system 2007  . Hyperlipidemia   . Hypertension   . Noncompliance     Past Surgical History  Procedure Date  . Coronary artery bypass graft 1995    LIMA to LAD , SVG to diagonal, SVG to OM1 and OM2 , SVG to RCA   . Bladder surgery     Social History:  reports that he has been smoking Cigarettes.  He has a 40 pack-year smoking history. He has never used smokeless tobacco. He reports that he does not drink alcohol or use illicit  drugs.  Living Situation: Lives and weeks for with his daughter. Activity Level: Fairly independent with his activities.   Family History: He mentions, a history of heart disease, and diabetes, in the family.  Review of Systems - History obtained from the patient General ROS: positive for  - chills Psychological ROS: negative Ophthalmic ROS: negative ENT ROS: negative Allergy and Immunology ROS: negative Hematological and Lymphatic ROS: negative Endocrine ROS: negative Respiratory ROS: no cough, shortness of breath, or wheezing Cardiovascular ROS: as in hpi Gastrointestinal ROS: no abdominal pain, change in bowel habits, or black or bloody stools Genito-Urinary ROS: no dysuria, trouble  voiding, or hematuria Musculoskeletal ROS: negative Neurological ROS: no TIA or stroke symptoms Dermatological ROS: negative  Physical Examination  Filed Vitals:   06/28/12 0145 06/28/12 0230 06/28/12 0315 06/28/12 0400  BP: 132/85 127/78 125/76 126/84  Pulse: 92 90 87 84  Temp:      Resp: 13 15 16 12   Height:      Weight:      SpO2: 95% 96% 95% 95%    General appearance: alert, cooperative, appears stated age and no distress Head: Normocephalic, without obvious abnormality, atraumatic Eyes: conjunctivae/corneas clear. PERRL, EOM's intact.  Throat: lips, mucosa, and tongue normal; teeth and gums normal Neck: no adenopathy, no carotid bruit, no JVD, supple, symmetrical, trachea midline and thyroid not enlarged, symmetric, no tenderness/mass/nodules Resp: clear to auscultation bilaterally Cardio: regular rate and rhythm, S1, S2 normal. Systolic murmur at apex. No click, rub or gallop GI: soft, non-tender; bowel sounds normal; no masses,  no organomegaly Extremities: pitting edema 1+ bilateral LE Pulses: 2+ and symmetric Skin: Skin color, texture, turgor normal. No rashes or lesions Lymph nodes: Cervical, supraclavicular, and axillary nodes normal. Neurologic: He is alert and oriented x3. No focal neurological deficits are present  Laboratory Data: Results for orders placed during the hospital encounter of 06/28/12 (from the past 48 hour(s))  CBC     Status: Abnormal   Collection Time   06/28/12  1:00 AM      Component Value Range Comment   WBC 2.9 (*) 4.0 - 10.5 K/uL    RBC 3.69 (*) 4.22 - 5.81 MIL/uL    Hemoglobin 12.4 (*) 13.0 - 17.0 g/dL    HCT 16.1 (*) 09.6 - 52.0 %    MCV 99.2  78.0 - 100.0 fL    MCH 33.6  26.0 - 34.0 pg    MCHC 33.9  30.0 - 36.0 g/dL    RDW 04.5 (*) 40.9 - 15.5 %    Platelets 168  150 - 400 K/uL   BASIC METABOLIC PANEL     Status: Abnormal   Collection Time   06/28/12  1:00 AM      Component Value Range Comment   Sodium 136  135 - 145 mEq/L     Potassium 3.2 (*) 3.5 - 5.1 mEq/L    Chloride 102  96 - 112 mEq/L    CO2 28  19 - 32 mEq/L    Glucose, Bld 127 (*) 70 - 99 mg/dL    BUN 9  6 - 23 mg/dL    Creatinine, Ser 8.11  0.50 - 1.35 mg/dL    Calcium 9.7  8.4 - 91.4 mg/dL    GFR calc non Af Amer 76 (*) >90 mL/min    GFR calc Af Amer 88 (*) >90 mL/min   TROPONIN I     Status: Normal   Collection Time   06/28/12  1:54 AM  Component Value Range Comment   Troponin I <0.30  <0.30 ng/mL   POCT I-STAT TROPONIN I     Status: Normal   Collection Time   06/28/12  2:26 AM      Component Value Range Comment   Troponin i, poc 0.01  0.00 - 0.08 ng/mL    Comment 3              Radiology Reports: Dg Chest Port 1 View  06/28/2012  *RADIOLOGY REPORT*  Clinical Data: Fever; loss of consciousness.  PORTABLE CHEST - 1 VIEW  Comparison: Chest radiograph performed 03/02/2006  Findings: The lungs are well-aerated and clear.  There is no evidence of focal opacification, pleural effusion or pneumothorax.  The cardiomediastinal silhouette is normal in size; the patient is status post median sternotomy.  No acute osseous abnormalities are seen.  IMPRESSION: No acute cardiopulmonary process seen.   Original Report Authenticated By: Tonia Ghent, M.D.     Electrocardiogram: Independently reviewed: Sinus tachycardia at 101 beats per minute. Left axis deviation is present. No definite Q waves. Intervals are normal. No obvious changes of WPW is noted. No concerning ST changes. Nonspecific T-wave changes are present.  Assessment/Plan  Principal Problem:  *Syncope Active Problems:  TOBACCO ABUSE  CORONARY ATHEROSCLEROSIS NATIVE CORONARY ARTERY  WOLFF (WOLFE)-PARKINSON-WHITE (WPW) SYNDROME  GERD  Chest pain   #1 syncope with chest pain: Differential diagnoses include vagal reaction, arrhythmias. Patient will be admitted to the hospital and monitored on telemetry. Cardiac enzymes will be checked. Due to his history of cardiac disease, cardiology will be  consulted. Echocardiogram will be obtained to further evaluate pedal edema. His symptoms are not suggestive of venous thromboembolism. He's been given IV fluids in the ED, but we will hold off on more IV fluids due to his pedal edema. He does not have any focal neurological deficits. Denies any headaches. No need for CT head at this time.  #2 hypokalemia. Will be repleted with potassium. Magnesium level will be checked.  #3 history of coronary artery disease, status post CABG and stents: Initial troponin is negative. Troponin will be cycled during hospitalization.  #4 history of GERD: Continue Protonix.  #5 history of tobacco abuse: He's been counseled. Nicotine patch will be utilized.  #6 DVT, prophylaxis with enoxaparin  Code Status: Full code Family Communication: His sister and brother-in-law where at bedside  Disposition Plan: He'll likely go home when stable    [facility]  [facility] Pager [phone]  If 7PM-7AM, please contact night-coverage [email or web address] Password [facility]  06/28/2012, 4:24 AM

## 2012-07-11 ENCOUNTER — Encounter: Payer: Medicare Other | Admitting: Adult Health

## 2012-07-13 ENCOUNTER — Telehealth: Payer: Self-pay | Admitting: Cardiology

## 2012-07-13 ENCOUNTER — Emergency Department (HOSPITAL_COMMUNITY): Payer: Medicare Other

## 2012-07-13 ENCOUNTER — Encounter (HOSPITAL_COMMUNITY): Payer: Self-pay

## 2012-07-13 ENCOUNTER — Inpatient Hospital Stay (HOSPITAL_COMMUNITY)
Admission: EM | Admit: 2012-07-13 | Discharge: 2012-07-16 | DRG: 392 | Disposition: A | Discharge status: Home / Self Care | Payer: Medicare Other | Attending: Internal Medicine | Admitting: Internal Medicine

## 2012-07-13 DIAGNOSIS — I10 Essential (primary) hypertension: Secondary | ICD-10-CM

## 2012-07-13 DIAGNOSIS — K5732 Diverticulitis of large intestine without perforation or abscess without bleeding: Principal | ICD-10-CM

## 2012-07-13 DIAGNOSIS — R55 Syncope and collapse: Secondary | ICD-10-CM

## 2012-07-13 DIAGNOSIS — I714 Abdominal aortic aneurysm, without rupture, unspecified: Secondary | ICD-10-CM

## 2012-07-13 DIAGNOSIS — D519 Vitamin B12 deficiency anemia, unspecified: Secondary | ICD-10-CM

## 2012-07-13 DIAGNOSIS — D3500 Benign neoplasm of unspecified adrenal gland: Secondary | ICD-10-CM

## 2012-07-13 DIAGNOSIS — Z951 Presence of aortocoronary bypass graft: Secondary | ICD-10-CM

## 2012-07-13 DIAGNOSIS — R0602 Shortness of breath: Secondary | ICD-10-CM

## 2012-07-13 DIAGNOSIS — K219 Gastro-esophageal reflux disease without esophagitis: Secondary | ICD-10-CM

## 2012-07-13 DIAGNOSIS — D539 Nutritional anemia, unspecified: Secondary | ICD-10-CM

## 2012-07-13 DIAGNOSIS — Z7982 Long term (current) use of aspirin: Secondary | ICD-10-CM

## 2012-07-13 DIAGNOSIS — R079 Chest pain, unspecified: Secondary | ICD-10-CM

## 2012-07-13 DIAGNOSIS — K5780 Diverticulitis of intestine, part unspecified, with perforation and abscess without bleeding: Secondary | ICD-10-CM

## 2012-07-13 DIAGNOSIS — K921 Melena: Secondary | ICD-10-CM

## 2012-07-13 DIAGNOSIS — E785 Hyperlipidemia, unspecified: Secondary | ICD-10-CM

## 2012-07-13 DIAGNOSIS — F17200 Nicotine dependence, unspecified, uncomplicated: Secondary | ICD-10-CM

## 2012-07-13 DIAGNOSIS — Z9119 Patient's noncompliance with other medical treatment and regimen: Secondary | ICD-10-CM

## 2012-07-13 DIAGNOSIS — I2510 Atherosclerotic heart disease of native coronary artery without angina pectoris: Secondary | ICD-10-CM

## 2012-07-13 DIAGNOSIS — D518 Other vitamin B12 deficiency anemias: Secondary | ICD-10-CM | POA: Diagnosis present

## 2012-07-13 DIAGNOSIS — K631 Perforation of intestine (nontraumatic): Secondary | ICD-10-CM

## 2012-07-13 DIAGNOSIS — Z79899 Other long term (current) drug therapy: Secondary | ICD-10-CM

## 2012-07-13 DIAGNOSIS — I456 Pre-excitation syndrome: Secondary | ICD-10-CM

## 2012-07-13 DIAGNOSIS — Z91199 Patient's noncompliance with other medical treatment and regimen due to unspecified reason: Secondary | ICD-10-CM

## 2012-07-13 HISTORY — DX: Abdominal aortic aneurysm, without rupture, unspecified: I71.40

## 2012-07-13 HISTORY — DX: Syncope and collapse: R55

## 2012-07-13 HISTORY — DX: Heart disease, unspecified: I51.9

## 2012-07-13 HISTORY — DX: Benign neoplasm of unspecified adrenal gland: D35.00

## 2012-07-13 HISTORY — DX: Abdominal aortic aneurysm, without rupture: I71.4

## 2012-07-13 HISTORY — DX: Vitamin B12 deficiency anemia, unspecified: D51.9

## 2012-07-13 LAB — CBC WITH DIFFERENTIAL/PLATELET
Basophils Absolute: 0 10*3/uL (ref 0.0–0.1)
Basophils Relative: 0 % (ref 0–1)
Hemoglobin: 12.8 g/dL — ABNORMAL LOW (ref 13.0–17.0)
MCHC: 33.4 g/dL (ref 30.0–36.0)
Monocytes Relative: 7 % (ref 3–12)
Neutro Abs: 5.9 10*3/uL (ref 1.7–7.7)
Neutrophils Relative %: 76 % (ref 43–77)
RBC: 3.77 MIL/uL — ABNORMAL LOW (ref 4.22–5.81)

## 2012-07-13 LAB — COMPREHENSIVE METABOLIC PANEL
ALT: 21 U/L (ref 0–53)
AST: 20 U/L (ref 0–37)
Albumin: 3.8 g/dL (ref 3.5–5.2)
CO2: 29 mEq/L (ref 19–32)
Calcium: 10.2 mg/dL (ref 8.4–10.5)
Sodium: 140 mEq/L (ref 135–145)
Total Protein: 6.7 g/dL (ref 6.0–8.3)

## 2012-07-13 LAB — URINALYSIS, ROUTINE W REFLEX MICROSCOPIC
Glucose, UA: NEGATIVE mg/dL
Hgb urine dipstick: NEGATIVE
Leukocytes, UA: NEGATIVE
Specific Gravity, Urine: 1.015 (ref 1.005–1.030)
pH: 7.5 (ref 5.0–8.0)

## 2012-07-13 LAB — D-DIMER, QUANTITATIVE: D-Dimer, Quant: 0.41 ug/mL-FEU (ref 0.00–0.48)

## 2012-07-13 LAB — PROTIME-INR
INR: 0.96 (ref 0.00–1.49)
Prothrombin Time: 12.7 seconds (ref 11.6–15.2)

## 2012-07-13 LAB — URINE MICROSCOPIC-ADD ON

## 2012-07-13 LAB — TROPONIN I: Troponin I: <0.3 ng/mL (ref <0.30)

## 2012-07-13 MED ORDER — HEPARIN SODIUM (PORCINE) 5000 UNIT/ML IJ SOLN
5000.0000 [IU] | Freq: Three times a day (TID) | INTRAMUSCULAR | Status: DC
  Administered 2012-07-15 – 2012-07-16 (×3): 5000 [IU] via SUBCUTANEOUS
  Filled 2012-07-13 (×6): qty 1

## 2012-07-13 MED ORDER — POTASSIUM CHLORIDE IN NACL 20-0.9 MEQ/L-% IV SOLN
INTRAVENOUS | Status: DC
  Administered 2012-07-13: 16:00:00 via INTRAVENOUS

## 2012-07-13 MED ORDER — ONDANSETRON HCL 4 MG/2ML IJ SOLN
4.0000 mg | Freq: Four times a day (QID) | INTRAMUSCULAR | Status: DC | PRN
  Administered 2012-07-14 – 2012-07-15 (×2): 4 mg via INTRAVENOUS
  Filled 2012-07-13 (×2): qty 2

## 2012-07-13 MED ORDER — PANTOPRAZOLE SODIUM 40 MG IV SOLR
40.0000 mg | INTRAVENOUS | Status: DC
  Administered 2012-07-13 – 2012-07-15 (×3): 40 mg via INTRAVENOUS
  Filled 2012-07-13 (×3): qty 40

## 2012-07-13 MED ORDER — ONDANSETRON HCL 4 MG/2ML IJ SOLN: 4.0000 mg | Freq: Three times a day (TID) | INTRAMUSCULAR | Status: DC | PRN

## 2012-07-13 MED ORDER — HYDROMORPHONE HCL PF 1 MG/ML IJ SOLN
1.0000 mg | INTRAMUSCULAR | Status: DC | PRN
  Administered 2012-07-14 – 2012-07-15 (×2): 1 mg via INTRAVENOUS
  Filled 2012-07-13 (×2): qty 1

## 2012-07-13 MED ORDER — IOHEXOL 300 MG/ML  SOLN
100.0000 mL | Freq: Once | INTRAMUSCULAR | Status: AC | PRN
  Administered 2012-07-13: 100 mL via INTRAVENOUS

## 2012-07-13 MED ORDER — CIPROFLOXACIN IN D5W 400 MG/200ML IV SOLN
400.0000 mg | Freq: Two times a day (BID) | INTRAVENOUS | Status: DC
  Administered 2012-07-14 – 2012-07-16 (×5): 400 mg via INTRAVENOUS
  Filled 2012-07-13 (×8): qty 200

## 2012-07-13 MED ORDER — ONDANSETRON HCL 4 MG PO TABS: 4.0000 mg | ORAL_TABLET | Freq: Four times a day (QID) | ORAL | Status: DC | PRN

## 2012-07-13 MED ORDER — METRONIDAZOLE IN NACL 5-0.79 MG/ML-% IV SOLN
500.0000 mg | Freq: Once | INTRAVENOUS | Status: AC
  Administered 2012-07-13: 500 mg via INTRAVENOUS
  Filled 2012-07-13: qty 100

## 2012-07-13 MED ORDER — METRONIDAZOLE IN NACL 5-0.79 MG/ML-% IV SOLN
500.0000 mg | Freq: Three times a day (TID) | INTRAVENOUS | Status: DC
  Administered 2012-07-13 – 2012-07-16 (×8): 500 mg via INTRAVENOUS
  Filled 2012-07-13 (×12): qty 100

## 2012-07-13 MED ORDER — NICOTINE 21 MG/24HR TD PT24
21.0000 mg | MEDICATED_PATCH | Freq: Every day | TRANSDERMAL | Status: DC
  Administered 2012-07-13 – 2012-07-16 (×4): 21 mg via TRANSDERMAL
  Filled 2012-07-13 (×4): qty 1

## 2012-07-13 MED ORDER — METOPROLOL TARTRATE 1 MG/ML IV SOLN
5.0000 mg | Freq: Four times a day (QID) | INTRAVENOUS | Status: DC
  Administered 2012-07-13 – 2012-07-15 (×6): 5 mg via INTRAVENOUS
  Filled 2012-07-13 (×7): qty 5

## 2012-07-13 MED ORDER — CLOPIDOGREL BISULFATE 75 MG PO TABS: 75.0000 mg | ORAL_TABLET | Freq: Every day | ORAL | Status: DC

## 2012-07-13 MED ORDER — SODIUM CHLORIDE 0.9 % IV SOLN: INTRAVENOUS | Status: DC

## 2012-07-13 MED ORDER — SODIUM CHLORIDE 0.9 % IV BOLUS (SEPSIS)
1000.0000 mL | Freq: Once | INTRAVENOUS | Status: AC
  Administered 2012-07-13 (×2): 1000 mL via INTRAVENOUS

## 2012-07-13 MED ORDER — CIPROFLOXACIN IN D5W 400 MG/200ML IV SOLN
400.0000 mg | Freq: Once | INTRAVENOUS | Status: AC
  Administered 2012-07-13: 400 mg via INTRAVENOUS
  Filled 2012-07-13: qty 200

## 2012-07-13 MED ORDER — ALBUTEROL SULFATE (5 MG/ML) 0.5% IN NEBU: 2.5000 mg | INHALATION_SOLUTION | RESPIRATORY_TRACT | Status: DC | PRN

## 2012-07-13 NOTE — ED Notes (Signed)
Report given to accepting rn. Dr. Sherrie Mustache to see pt, in exam room.

## 2012-07-13 NOTE — Consult Note (Signed)
Reason for Consult: Abdominal pain Referring Physician: ED physician  [name] is an 62 y.o. male.  HPI: Patient presented to [facility] hospital after an episode of lightheadedness and a syncopal event at [facility] earlier today. She had similar symptomatology approximately 2 weeks ago for which she was worked up [employer]. He did not have any additional episodes in the interim. Over that same period of time patient is noted some periumbilical abdominal tenderness. This is been intermittent but has been slowly located in the periumbilical region. No significant radiation. Occasionally pain is worse with palpation. He has not noted any change with oral intake. He has had some occasional blood noted within the stools are mostly on the toilet paper upon wiping. He denies any emesis but has had some nausea. No past history of any symptoms similar. He has never been totally is had diverticulitis before. He has never had a colonoscopy. He has no significant family history of GI problems.  Past Medical History  Diagnosis Date  . Wolff-Parkinson-White (WPW) syndrome     Failed ablation  . Coronary artery disease     Multivessel, LVEF 50%, occluded SVG to RCA, DES SVG to OM system 2007  . Hyperlipidemia   . Hypertension   . Noncompliance     Past Surgical History  Procedure Date  . Coronary artery bypass graft 1995    LIMA to LAD , SVG to diagonal, SVG to OM1 and OM2 , SVG to RCA   . Bladder surgery     No family history on file.  Social History:  reports that he has been smoking Cigarettes.  He has a 40 pack-year smoking history. He has never used smokeless tobacco. He reports that he does not drink alcohol or use illicit drugs.  Allergies:  Allergies  Allergen Reactions  . Codeine     Medications:  I have reviewed the patient's current medications. Prior to Admission:  (Not in a hospital admission) Scheduled:  Continuous:   . sodium chloride    . [COMPLETED] ciprofloxacin  Stopped (07/13/12 1444)  . [COMPLETED] metronidazole 500 mg (07/13/12 1338)  . [COMPLETED] sodium chloride Stopped (07/13/12 1500)   PRN:[COMPLETED] iohexol, ondansetron (ZOFRAN) IV  Results for orders placed during the hospital encounter of 07/13/12 (from the past 48 hour(s))  CBC WITH DIFFERENTIAL     Status: Abnormal   Collection Time   07/13/12 11:46 AM      Component Value Range Comment   WBC 7.8  4.0 - 10.5 K/uL    RBC 3.77 (*) 4.22 - 5.81 MIL/uL    Hemoglobin 12.8 (*) 13.0 - 17.0 g/dL    HCT 16.1 (*) 09.6 - 52.0 %    MCV 101.6 (*) 78.0 - 100.0 fL    MCH 34.0  26.0 - 34.0 pg    MCHC 33.4  30.0 - 36.0 g/dL    RDW 04.5  40.9 - 81.1 %    Platelets 243  150 - 400 K/uL    Neutrophils Relative 76  43 - 77 %    Neutro Abs 5.9  1.7 - 7.7 K/uL    Lymphocytes Relative 16  12 - 46 %    Lymphs Abs 1.2  0.7 - 4.0 K/uL    Monocytes Relative 7  3 - 12 %    Monocytes Absolute 0.5  0.1 - 1.0 K/uL    Eosinophils Relative 1  0 - 5 %    Eosinophils Absolute 0.1  0.0 - 0.7 K/uL  Basophils Relative 0  0 - 1 %    Basophils Absolute 0.0  0.0 - 0.1 K/uL   PROTIME-INR     Status: Normal   Collection Time   07/13/12 11:46 AM      Component Value Range Comment   Prothrombin Time 12.7  11.6 - 15.2 seconds    INR 0.96  0.00 - 1.49   COMPREHENSIVE METABOLIC PANEL     Status: Abnormal   Collection Time   07/13/12 11:46 AM      Component Value Range Comment   Sodium 140  135 - 145 mEq/L    Potassium 3.9  3.5 - 5.1 mEq/L    Chloride 103  96 - 112 mEq/L    CO2 29  19 - 32 mEq/L    Glucose, Bld 106 (*) 70 - 99 mg/dL    BUN 6  6 - 23 mg/dL    Creatinine, Ser 4.54  0.50 - 1.35 mg/dL    Calcium 09.8  8.4 - 10.5 mg/dL    Total Protein 6.7  6.0 - 8.3 g/dL    Albumin 3.8  3.5 - 5.2 g/dL    AST 20  0 - 37 U/L    ALT 21  0 - 53 U/L    Alkaline Phosphatase 77  39 - 117 U/L    Total Bilirubin 1.0  0.3 - 1.2 mg/dL    GFR calc non Af Amer 68 (*) >90 mL/min    GFR calc Af Amer 79 (*) >90 mL/min     LIPASE, BLOOD     Status: Normal   Collection Time   07/13/12 11:46 AM      Component Value Range Comment   Lipase 30  11 - 59 U/L   TROPONIN I     Status: Normal   Collection Time   07/13/12 11:46 AM      Component Value Range Comment   Troponin I <0.30  <0.30 ng/mL   D-DIMER, QUANTITATIVE     Status: Normal   Collection Time   07/13/12 11:46 AM      Component Value Range Comment   D-Dimer, Quant 0.41  0.00 - 0.48 ug/mL-FEU   URINALYSIS, ROUTINE W REFLEX MICROSCOPIC     Status: Abnormal   Collection Time   07/13/12 11:48 AM      Component Value Range Comment   Color, Urine YELLOW  YELLOW    APPearance CLEAR  CLEAR    Specific Gravity, Urine 1.015  1.005 - 1.030    pH 7.5  5.0 - 8.0    Glucose, UA NEGATIVE  NEGATIVE mg/dL    Hgb urine dipstick NEGATIVE  NEGATIVE    Bilirubin Urine NEGATIVE  NEGATIVE    Ketones, ur NEGATIVE  NEGATIVE mg/dL    Protein, ur 30 (*) NEGATIVE mg/dL    Urobilinogen, UA 1.0  0.0 - 1.0 mg/dL    Nitrite NEGATIVE  NEGATIVE    Leukocytes, UA NEGATIVE  NEGATIVE   URINE MICROSCOPIC-ADD ON     Status: Normal   Collection Time   07/13/12 11:48 AM      Component Value Range Comment   Squamous Epithelial / LPF RARE  RARE    RBC / HPF 0-2  <3 RBC/hpf     Dg Chest 2 View  07/13/2012  RADIOLOGY REPORT*  Clinical Data: Syncopal episode, chest pain, 2 stents, smoker, 7 bypasses in 1995  CHEST - 2 VIEW  Comparison:  June 28, 2012  Findings:  There is  no focal infiltrate, pulmonary edema, or pleural effusion.  There is a stable 3.7 mm calcified granuloma in the left lung base unchanged. The mediastinal contour and cardiac silhouette are stable.  The patient status post prior CABG and median sternotomy. The visualized soft tissue and skeletal structures are stable.  IMPRESSION: No acute cardiopulmonary disease.   Original Report Authenticated By: Sherian Rein, M.D.    Ct Abdomen Pelvis W Contrast  07/13/2012  *RADIOLOGY REPORT*  Clinical Data: Left lower  quadrant pain.  Syncope.  CT ABDOMEN AND PELVIS WITH CONTRAST  Technique:  Multidetector CT imaging of the abdomen and pelvis was performed following the standard protocol during bolus administration of intravenous contrast.  Contrast: OMNIPAQUE IOHEXOL 300 MG/ML  SOLN  Comparison: None.  Findings: Diverticulosis, bladder wall thickening and mild pericolonic inflammatory changes are seen involving the proximal sigmoid colon, consistent with diverticulitis.  A few tiny extraluminal air bubbles are seen in the adjacent sigmoid mesocolon, consistent with microperforation.  No evidence of drainable abscess or ascites.  No evidence of free intraperitoneal air.  No other inflammatory process or abnormal fluid collections are seen.  No evidence of bowel obstruction.  The abdominal parenchymal organs are normal in appearance.  No evidence of hydronephrosis. Gallbladder is unremarkable.  No soft tissue masses or lymphadenopathy identified.  A small right adrenal mass is seen measuring 1.5 cm which shows significant contrast washout, consistent with a benign adrenal adenoma.  An infrarenal abdominal aortic aneurysm is seen measuring 4.0 cm in maximal diameter.  No evidence of aneurysm leak or rupture.  IMPRESSION:  1.  Moderate sigmoid diverticulitis with microperforation.  No evidence of abscess or free air. 2.  4.0 cm infrarenal abdominal aortic aneurysm.  For aneurysm of this size, continued imaging followup is recommended in 1 year. This recommendation follows ACR consensus guidelines:  White Paper of the ACR Incidental Findings Committee II on Vascular Findings. J Am Coll Radiol [id number]. 3.  Small benign right adrenal adenoma incidentally noted.   Original Report Authenticated By: Myles Rosenthal, M.D.     Review of Systems  Constitutional: Positive for fever and chills. Negative for weight loss, malaise/fatigue and diaphoresis.  HENT: Negative.   Eyes: Negative.   Respiratory: Negative.     Cardiovascular: Positive for chest pain.  Gastrointestinal: Positive for heartburn, nausea, vomiting, abdominal pain (2 weeks) and blood in stool (intermittent). Negative for diarrhea, constipation and melena.  Genitourinary: Negative.   Musculoskeletal: Negative.   Skin: Negative.   Neurological: Positive for dizziness and loss of consciousness. Negative for weakness.  Endo/Heme/Allergies: Negative.   Psychiatric/Behavioral: Negative.    Blood pressure 131/72, pulse 88, temperature 98.7 F (37.1 C), temperature source Oral, resp. rate 18, height 5\' 8"  (1.727 m), weight 74.844 kg (165 lb), SpO2 96.00%. Physical Exam  Constitutional: He is oriented to person, place, and time. He appears well-developed and well-nourished. No distress.  HENT:  Head: Normocephalic and atraumatic.  Eyes: Conjunctivae normal and EOM are normal. Pupils are equal, round, and reactive to light. No scleral icterus.  Neck: Neck supple. No tracheal deviation present. No thyromegaly present.  Cardiovascular: Normal rate, regular rhythm and normal heart sounds.   Respiratory: Effort normal and breath sounds normal. No respiratory distress.  GI: Soft. Bowel sounds are normal. He exhibits no distension and no mass. There is tenderness (mild periumbilical tenderness no diffuse peritoneal signs.). There is no rebound and no guarding.  Musculoskeletal: Normal range of motion.  Lymphadenopathy:    He has no cervical  adenopathy.  Neurological: He is alert and oriented to person, place, and time.  Skin: Skin is warm and dry.    Assessment/Plan: Mild sigmoid diverticulitis. Syncopal episode. Coronary artery disease history. At this point have a low suspicion of his diverticular process causing his syncopal episode released directly. Patient does not demonstrate any evidence of an increased WBC count and at this time would continue the patient on some Cipro and Flagyl to cover the diverticular process. I do feel additional  workup from a cardiac standpoint would be warranted at this time due to the patient's continued syncopal episodes. I also keep the patient on at the most a clear liquid diet until his abdominal symptomatology improves. Furthermore we have discussed possible colonoscopy down the road upon resolution of his diverticular process. I do not feel that this needs to be done emergently or urgently but due to his lack of prior history and his age I do feel colonoscopy as an outpatient would be necessary. I will continue to follow his course during his hospitalization. Appreciate the opportunity to assist in his care.  Beatris Belen C 07/13/2012, 3:01 PM

## 2012-07-13 NOTE — ED Notes (Signed)
Pt cont. To rest in bed, talking with family members.

## 2012-07-13 NOTE — ED Notes (Signed)
Pt arrived by ems was shopping in [employer] and started feeling like he was going to pass out. Had some chest pain at onset that has now resolved.   Alert at arrived.

## 2012-07-13 NOTE — ED Notes (Signed)
Attempted to call report, nurse busy will return call. 

## 2012-07-13 NOTE — ED Notes (Signed)
Pt stated he is feeling much better and thinks the fluids are helping.

## 2012-07-13 NOTE — ED Provider Notes (Signed)
History    This chart was scribed for Glynn Octave, MD, MD by Smitty Pluck, ED Scribe. The patient was seen in room APA19 and the patient's care was started at 11:24AM.   CSN: [id number]  Arrival date & time 07/13/12  1110       Chief Complaint  Patient presents with  . Near Syncope    (Consider location/radiation/quality/duration/timing/severity/associated sxs/prior treatment) The history is provided by the patient. No language interpreter was used.   [name] is a 61 y.o. male with h/o CAD, MI, heart stints, HTN and hyperlipidemia who presents to the Emergency Department complaining of near syncope today. Pt reports that he was at [facility] walking around and he felt light headed and dizzy. Pt lowered himself to the floor. He mentions having constant, moderate abdominal pain. He states that he has a cough and chest pain. He states the chest pain subsided after he started gagging at [facility]. The chest pain started when the episode of near syncope occurred. He was in ED 2 weeks ago for same symptoms and was admitted. He reports that he has not felt well over the past 2 days. Denies head injury, neck injury, nausea, vomiting, diarrhea, headache and any other pain . Pt reports taking plavix and asa. Pt smokes cigarettes.   Past Medical History  Diagnosis Date  . Wolff-Parkinson-White (WPW) syndrome     Failed ablation  . Coronary artery disease     Multivessel, LVEF 50%, occluded SVG to RCA, DES SVG to OM system 2007  . Hyperlipidemia   . Hypertension   . Noncompliance     Past Surgical History  Procedure Date  . Coronary artery bypass graft 1995    LIMA to LAD , SVG to diagonal, SVG to OM1 and OM2 , SVG to RCA   . Bladder surgery     No family history on file.  History  Substance Use Topics  . Smoking status: Current Every Day Smoker -- 1.0 packs/day for 40 years    Types: Cigarettes  . Smokeless tobacco: Never Used  . Alcohol Use: No      Review of Systems    All other systems reviewed and are negative.  10 Systems reviewed and all are negative for acute change except as noted in the HPI.   Allergies  Codeine  Home Medications   Current Outpatient Rx  Name  Route  Sig  Dispense  Refill  . ALTACE 2.5 MG PO CAPS      TAKE (1) CAPSULE BY MOUTH ONCE DAILY.   30 each   12   . ASPIRIN 325 MG PO TABS   Oral   Take 325 mg by mouth daily.           Marland Kitchen LIPITOR 80 MG PO TABS      TAKE 1 TABLET BY MOUTH ONCE DAILY FOR CHOLESTEROL.   30 each   12   . METOPROLOL TARTRATE 25 MG PO TABS      TAKE (1) TABLET BY MOUTH TWICE DAILY.   60 tablet   6   . PLAVIX 75 MG PO TABS      TAKE ONE TABLET BY MOUTH ONCE DAILY.   90 tablet   3   . PROTONIX 40 MG PO TBEC      TAKE 1 TABLET BY MOUTH ONCE DAILY FOR ACID REFLUX.   30 each   12   . TRICOR 145 MG PO TABS      TAKE 1 TABLET  BY MOUTH ONCE DAILY FOR CHOLESTEROL.   30 each   12     BP 131/72  Pulse 88  Temp 98.7 F (37.1 C) (Oral)  Resp 18  Ht 5\' 8"  (1.727 m)  Wt 165 lb (74.844 kg)  BMI 25.09 kg/m2  SpO2 96%  Physical Exam  Nursing note and vitals reviewed. Constitutional: He is oriented to person, place, and time. He appears well-developed and well-nourished. No distress.  HENT:  Head: Normocephalic and atraumatic.  Neck: Normal range of motion. Neck supple.  Cardiovascular: Intact distal pulses.  Tachycardia present.   Pulmonary/Chest: Effort normal. No respiratory distress.  Abdominal: There is tenderness in the left lower quadrant. There is no rebound and no guarding.  Neurological: He is alert and oriented to person, place, and time.  Skin: Skin is warm and dry.  Psychiatric: He has a normal mood and affect. His behavior is normal.    ED Course  Procedures (including critical care time) DIAGNOSTIC STUDIES: Oxygen Saturation is 96% on room air, adequate by my interpretation.    COORDINATION OF CARE: 11:29 AM Discussed ED treatment with pt  12:32 PM Ordered:    Medications  0.9 %  sodium chloride infusion (not administered)  ondansetron (ZOFRAN) injection 4 mg (not administered)  sodium chloride 0.9 % bolus 1,000 mL (0 mL Intravenous Stopped 07/13/12 1500)  iohexol (OMNIPAQUE) 300 MG/ML solution 100 mL (100 mL Intravenous Contrast Given 07/13/12 1251)  ciprofloxacin (CIPRO) IVPB 400 mg (0 mg Intravenous Stopped 07/13/12 1444)  metroNIDAZOLE (FLAGYL) IVPB 500 mg (500 mg Intravenous New Bag/Given 07/13/12 1338)   12:32 PM recheck: discussed pt lab results with pt. Pt denies having any chest pain.    Labs Reviewed  CBC WITH DIFFERENTIAL - Abnormal; Notable for the following:    RBC 3.77 (*)     Hemoglobin 12.8 (*)     HCT 38.3 (*)     MCV 101.6 (*)     All other components within normal limits  COMPREHENSIVE METABOLIC PANEL - Abnormal; Notable for the following:    Glucose, Bld 106 (*)     GFR calc non Af Amer 68 (*)     GFR calc Af Amer 79 (*)     All other components within normal limits  URINALYSIS, ROUTINE W REFLEX MICROSCOPIC - Abnormal; Notable for the following:    Protein, ur 30 (*)     All other components within normal limits  PROTIME-INR  LIPASE, BLOOD  TROPONIN I  D-DIMER, QUANTITATIVE  URINE MICROSCOPIC-ADD ON   Dg Chest 2 View  07/13/2012  RADIOLOGY REPORT*  Clinical Data: Syncopal episode, chest pain, 2 stents, smoker, 7 bypasses in 1995  CHEST - 2 VIEW  Comparison:  June 28, 2012  Findings:  There is no focal infiltrate, pulmonary edema, or pleural effusion.  There is a stable 3.7 mm calcified granuloma in the left lung base unchanged. The mediastinal contour and cardiac silhouette are stable.  The patient status post prior CABG and median sternotomy. The visualized soft tissue and skeletal structures are stable.  IMPRESSION: No acute cardiopulmonary disease.   Original Report Authenticated By: Sherian Rein, M.D.    Ct Abdomen Pelvis W Contrast  07/13/2012  *RADIOLOGY REPORT*  Clinical Data: Left lower quadrant  pain.  Syncope.  CT ABDOMEN AND PELVIS WITH CONTRAST  Technique:  Multidetector CT imaging of the abdomen and pelvis was performed following the standard protocol during bolus administration of intravenous contrast.  Contrast: OMNIPAQUE IOHEXOL 300 MG/ML  SOLN  Comparison: None.  Findings: Diverticulosis, bladder wall thickening and mild pericolonic inflammatory changes are seen involving the proximal sigmoid colon, consistent with diverticulitis.  A few tiny extraluminal air bubbles are seen in the adjacent sigmoid mesocolon, consistent with microperforation.  No evidence of drainable abscess or ascites.  No evidence of free intraperitoneal air.  No other inflammatory process or abnormal fluid collections are seen.  No evidence of bowel obstruction.  The abdominal parenchymal organs are normal in appearance.  No evidence of hydronephrosis. Gallbladder is unremarkable.  No soft tissue masses or lymphadenopathy identified.  A small right adrenal mass is seen measuring 1.5 cm which shows significant contrast washout, consistent with a benign adrenal adenoma.  An infrarenal abdominal aortic aneurysm is seen measuring 4.0 cm in maximal diameter.  No evidence of aneurysm leak or rupture.  IMPRESSION:  1.  Moderate sigmoid diverticulitis with microperforation.  No evidence of abscess or free air. 2.  4.0 cm infrarenal abdominal aortic aneurysm.  For aneurysm of this size, continued imaging followup is recommended in 1 year. This recommendation follows ACR consensus guidelines:  White Paper of the ACR Incidental Findings Committee II on Vascular Findings. J Am Coll Radiol [phone]. 3.  Small benign right adrenal adenoma incidentally noted.   Original Report Authenticated By: Myles Rosenthal, M.D.      1. Perforated diverticulitis   2. Syncope   3. Chest pain       MDM  Near syncopal episode while walking in [place] today and lower self the floor. Afterwards had left-sided chest pain reminiscent of  prior MIs. Has had left lower quadrant pain for the past several days. Admission 2 weeks ago for similar symptoms. Seen by cardiology and determined not to be cardiac syncope  Patient's workup is remarkable for sigmoid diverticulitis with microperforation. His left lower quadrant tenderness without peritoneal signs. His tachycardia is responsive to fluids. Seems unlikely that his diverticulitis is related to syncopal episode today. Patient's history is concerning for cardiac etiology of his symptoms despite his admission 2 weeks ago for similar symptoms. Recent echocardiogram that showed an EF of 45% no valvular abnormalities.  Somewhat orthostatic today.   Hemoglobin stable. D/w Dr. Leticia Penna, who agrees with cipro and flagyl and will consult. Admit to Dr. Sherrie Mustache of medicine service.    Date: 07/13/2012  Rate: 111  Rhythm: sinus tachycardia  QRS Axis: left  Intervals: normal  ST/T Wave abnormalities: normal  Conduction Disutrbances:none  Narrative Interpretation:   Old EKG Reviewed: unchanged    I personally performed the services described in this documentation, which was scribed in my presence. The recorded information has been reviewed and is accurate.    Glynn Octave, MD 07/13/12 [id number]

## 2012-07-13 NOTE — ED Notes (Signed)
Pt resting in bed, resp even and unlabored, drinking contrast.

## 2012-07-13 NOTE — Telephone Encounter (Signed)
Pt's sister calling re pt passing out at [employer] and on his way to [employer]

## 2012-07-13 NOTE — H&P (Addendum)
[facility] History and Physical  [name]:[id number] DOB: May 18, 1951 DOA: 07/13/2012  Referring physician: Dr Manus Gunning PCP: Cassell Smiles., MD  Specialists: Cardiologist, Dr. Diona Browner  Chief Complaint: Almost passed out and abdominal pain.  HPI: [name] is a 61 y.o. male with a history significant for Wolff-Parkinson-White syndrome, coronary artery disease, and recent hospitalization for syncope secondary to a gastroenteritis a few weeks ago, who presents to the emergency department today with an episode of nearly passing out. The patient woke up in his usual state of health. Earlier today, he was shopping at [employer]. He felt lightheaded and dizzy. He had "a hot flash". He felt himself drifting to the floor so he was able to lay himself down on the floor after leaning on a shopping cart. He did not completely pass out.  Following the episode, he developed left-sided chest pain and a slight frontal headache. Both have resolved. There was no incontinence of bladder or bowels. He denies focal extremity weakness. He denies difficulty speaking or swallowing. No one reported a shaking activity. He recovered and he feels fine now. He has also had intermittent abdominal pain for the past few weeks extending back to the previous hospitalization. He describes his pain as an aching pain that radiates from his left lower quadrant to his right lower quadrant. He rates the pain 2-5 in intensity. It has been intermittent over the past few weeks. Lately, it has not been associated with diarrhea or constipation. He did have bright red blood per rectum once a few weeks ago. He denies subjective fever but he has had chills. He has had nausea and dry heaves but no vomiting. His last meal was last night which did not calls abdominal pain or nausea.  In the emergency department, he is afebrile and hemodynamically stable. His white blood cell count is within normal limits at 7.8. His hemoglobin is  12.8 with an MCV of 101.6. His electrolytes and liver transaminases are essentially normal. CT of his abdomen and pelvis reveals moderate sigmoid diverticulitis with microperforation, but no evidence of abscess or free air. It also reveals a 4.0 infrarenal abdominal aortic aneurysm and a small benign right adrenal adenoma. He is being admitted for further evaluation and management.   Review of Systems: As above in history present illness, otherwise negative.  Past Medical History  Diagnosis Date  . Wolff-Parkinson-White (WPW) syndrome     Failed ablation  . Coronary artery disease     Multivessel, LVEF 50%, occluded SVG to RCA, DES SVG to OM system 2007  . Hyperlipidemia   . Hypertension   . Noncompliance   . Syncope 06/28/2012    Acute gastroenteritis; vasovagal.   Past Surgical History  Procedure Date  . Coronary artery bypass graft 1995    LIMA to LAD , SVG to diagonal, SVG to OM1 and OM2 , SVG to RCA   . Bladder surgery    Social History: The patient is divorced. He lives in [place]. He has one daughter. He is disabled. He smokes one pack of cigarettes per day and has been doing so for several decades. He denies alcohol and illicit drug use.   Allergies  Allergen Reactions  . Codeine     Family history: His mother died of Alzheimer's disease. His father died of a heart attack at 83 years of age.  Prior to Admission medications   Medication Sig Start Date End Date Taking? Authorizing Provider  ALTACE 2.5 MG capsule TAKE (1) CAPSULE BY  MOUTH ONCE DAILY. 01/21/12  Yes Kathlen Brunswick, MD  aspirin 325 MG tablet Take 325 mg by mouth daily.     Yes Historical Provider, MD  LIPITOR 80 MG tablet TAKE 1 TABLET BY MOUTH ONCE DAILY FOR CHOLESTEROL. 01/21/12  Yes Kathlen Brunswick, MD  metoprolol tartrate (LOPRESSOR) 25 MG tablet TAKE (1) TABLET BY MOUTH TWICE DAILY. 06/22/12  Yes Jonelle Sidle, MD  PLAVIX 75 MG tablet TAKE ONE TABLET BY MOUTH ONCE DAILY. 05/23/12  Yes Jonelle Sidle, MD  PROTONIX 40 MG tablet TAKE 1 TABLET BY MOUTH ONCE DAILY FOR ACID REFLUX. 01/21/12  Yes Kathlen Brunswick, MD  TRICOR 145 MG tablet TAKE 1 TABLET BY MOUTH ONCE DAILY FOR CHOLESTEROL. 01/21/12  Yes Kathlen Brunswick, MD   Physical Exam: Filed Vitals:   07/13/12 1141 07/13/12 1142 07/13/12 1340 07/13/12 1451  BP: 121/83 141/86 120/78 131/72  Pulse: 105 113 97 88  Temp:   98.7 F (37.1 C)   TempSrc:   Oral   Resp:   18 18  Height:      Weight:      SpO2:   96% 96%     General:  Pleasant, alert 61 year old man laying in bed, in no acute distress.  Head: Normocephalic nontraumatic.  Eyes: Pupils are equal, round, and reactive to light. Extraocular movements are intact. Conjunctivae are clear. Sclerae are white.  ENT: Oropharynx reveals mildly dry mucous membranes. No posterior exudates or erythema.  Neck: No adenopathy, no thyromegaly, no JVD.  Cardiovascular: S1, S2, with no murmurs rubs or gallops.  Respiratory: Occasional wheezes in the mid lobes. Breathing is nonlabored.  Abdomen: Hypoactive bowel sounds, soft, mildly obese, moderately tender in the hypogastrium, no rigidity, no appreciable distention, no masses palpated.  Skin: Good turgor.  Musculoskeletal: No acute hot red joints. Pedal pulses palpable bilaterally. No pedal edema.  Psychiatric: Alert. Pleasant affect. Speech is clear. He is cooperative.  Neurologic: He is alert and oriented x3. Cranial nerves II through XII are intact. His speech is clear. Strength is 5 over 5 of the upper and lower extremities bilaterally. Sensation is grossly intact. No nystagmus. Negative Romberg.  Labs on Admission:  Basic Metabolic Panel:  Lab 07/13/12 1610  NA 140  K 3.9  CL 103  CO2 29  GLUCOSE 106*  BUN 6  CREATININE 1.15  CALCIUM 10.2  MG --  PHOS --   Liver Function Tests:  Lab 07/13/12 1146  AST 20  ALT 21  ALKPHOS 77  BILITOT 1.0  PROT 6.7  ALBUMIN 3.8    Lab 07/13/12 1146  LIPASE 30    AMYLASE --   No results found for this basename: AMMONIA:5 in the last 168 hours CBC:  Lab 07/13/12 1146  WBC 7.8  NEUTROABS 5.9  HGB 12.8*  HCT 38.3*  MCV 101.6*  PLT 243   Cardiac Enzymes:  Lab 07/13/12 1146  CKTOTAL --  CKMB --  CKMBINDEX --  TROPONINI <0.30    BNP (last 3 results)  Basename 06/28/12 0456  PROBNP 239.5*   CBG: No results found for this basename: GLUCAP:5 in the last 168 hours  Radiological Exams on Admission: Dg Chest 2 View  07/13/2012  RADIOLOGY REPORT*  Clinical Data: Syncopal episode, chest pain, 2 stents, smoker, 7 bypasses in 1995  CHEST - 2 VIEW  Comparison:  June 28, 2012  Findings:  There is no focal infiltrate, pulmonary edema, or pleural effusion.  There is a stable 3.7  mm calcified granuloma in the left lung base unchanged. The mediastinal contour and cardiac silhouette are stable.  The patient status post prior CABG and median sternotomy. The visualized soft tissue and skeletal structures are stable.  IMPRESSION: No acute cardiopulmonary disease.   Original Report Authenticated By: Sherian Rein, M.D.    Ct Abdomen Pelvis W Contrast  07/13/2012  *RADIOLOGY REPORT*  Clinical Data: Left lower quadrant pain.  Syncope.  CT ABDOMEN AND PELVIS WITH CONTRAST  Technique:  Multidetector CT imaging of the abdomen and pelvis was performed following the standard protocol during bolus administration of intravenous contrast.  Contrast: OMNIPAQUE IOHEXOL 300 MG/ML  SOLN  Comparison: None.  Findings: Diverticulosis, bladder wall thickening and mild pericolonic inflammatory changes are seen involving the proximal sigmoid colon, consistent with diverticulitis.  A few tiny extraluminal air bubbles are seen in the adjacent sigmoid mesocolon, consistent with microperforation.  No evidence of drainable abscess or ascites.  No evidence of free intraperitoneal air.  No other inflammatory process or abnormal fluid collections are seen.  No evidence of bowel  obstruction.  The abdominal parenchymal organs are normal in appearance.  No evidence of hydronephrosis. Gallbladder is unremarkable.  No soft tissue masses or lymphadenopathy identified.  A small right adrenal mass is seen measuring 1.5 cm which shows significant contrast washout, consistent with a benign adrenal adenoma.  An infrarenal abdominal aortic aneurysm is seen measuring 4.0 cm in maximal diameter.  No evidence of aneurysm leak or rupture.  IMPRESSION:  1.  Moderate sigmoid diverticulitis with microperforation.  No evidence of abscess or free air. 2.  4.0 cm infrarenal abdominal aortic aneurysm.  For aneurysm of this size, continued imaging followup is recommended in 1 year. This recommendation follows ACR consensus guidelines:  White Paper of the ACR Incidental Findings Committee II on Vascular Findings. J Am Coll Radiol [phone]. 3.  Small benign right adrenal adenoma incidentally noted.   Original Report Authenticated By: Myles Rosenthal, M.D.     EKG: Sinus bradycardia, left axis deviation, heart rate 111 beats per minute.  2-D echocardiogram on 06/29/2012- Left ventricle: Mid inferior and inferobasal wall hypokinesis The cavity size was normal. Wall thickness was increased in a pattern of mild LVH. Systolic function was mildly reduced. The estimated ejection fraction was in the range of 45% to 50%. - Mitral valve: Mild regurgitation. - Left atrium: The atrium was mildly dilated. - Atrial septum: No defect or patent foramen ovale was identified. - Pericardium, extracardiac: A trivial pericardial effusion was identified posterior to the heart. - Impressions: EF stable and consistant with old myovue showed EF in 45% range and inferior scar Impressions:  - EF stable and consistant with old myovue showed EF in 45% range and inferior scar     Assessment/Plan Principal Problem:  *Near syncope Active Problems:  Sigmoid diverticulitis  Perforation of sigmoid colon  TOBACCO  ABUSE  HYPERTENSION  CORONARY ATHEROSCLEROSIS NATIVE CORONARY ARTERY  WOLFF (WOLFE)-PARKINSON-WHITE (WPW) SYNDROME  GERD   1. This is a 61 year old man with Wolff-Parkinson-White syndrome and coronary artery disease, who presents with presyncope and a review of systems positive for abdominal pain. He was discharged a couple of weeks ago for a syncopal episode which was thought to be secondary to a vasovagal response from an acute gastroenteritis. He was evaluated by cardiology who believed that his syncopal episode was not cardiac in origin. It is interesting to note, that this is the patient's second gastrointestinal event associated with a near syncopal or syncopal event.  His abdominal CT reveals acute diverticulitis with microperforation, yet his white blood cell count is normal and he is afebrile. Subjectively, it sounds like he may have had a fever and associated chills at home. He denies diarrhea or severe abdominal pain. Of note, the CT of his abdomen also reveals a 4 cm infrarenal aortic aneurysm. No bruit were heard on exam.       Plan: 1. The patient was started on Cipro and Flagyl in the emergency department. This will continue.  2. General surgeon, Dr. Dian Situ has evaluated the patient. His recommendations are noted and appreciated.  3. We'll keep the patient n.p.o. with exception of ice chips occasionally. 4. IV fluid hydration. We'll watch the patient's volume status as his ejection fraction was noted to be 45-50% a few weeks ago. 5. We'll give Protonix IV. Will give metoprolol IV. Will hold all of his other chronic medications. 6. Pain management and antiemetics as needed. 7. We'll place a nicotine patch. We'll order tobacco cessation counseling. The patient was advised to stop smoking.    Code Status: Full code Family Communication: discussed with sister and brother-in-law  Disposition Plan: home when medically improved and stable.   Time spent: 1 hour and 10 minutes.    [facility] Pager [phone].   If 7PM-7AM, please contact night-coverage [email or web address] Password [facility] 07/13/2012, 3:43 PM

## 2012-07-14 DIAGNOSIS — D539 Nutritional anemia, unspecified: Secondary | ICD-10-CM

## 2012-07-14 LAB — LIPID PANEL
Cholesterol: 111 mg/dL (ref 0–200)
HDL: 26 mg/dL — ABNORMAL LOW (ref >39)
Total CHOL/HDL Ratio: 4.3 RATIO
VLDL: 20 mg/dL (ref 0–40)

## 2012-07-14 LAB — COMPREHENSIVE METABOLIC PANEL
AST: 14 U/L (ref 0–37)
Albumin: 3.2 g/dL — ABNORMAL LOW (ref 3.5–5.2)
BUN: 8 mg/dL (ref 6–23)
CO2: 28 mEq/L (ref 19–32)
Calcium: 9.5 mg/dL (ref 8.4–10.5)
Creatinine, Ser: 1.09 mg/dL (ref 0.50–1.35)
GFR calc non Af Amer: 72 mL/min — ABNORMAL LOW (ref >90)

## 2012-07-14 LAB — CBC
Hemoglobin: 11.7 g/dL — ABNORMAL LOW (ref 13.0–17.0)
MCH: 33.5 pg (ref 26.0–34.0)
RBC: 3.49 MIL/uL — ABNORMAL LOW (ref 4.22–5.81)
WBC: 8.2 10*3/uL (ref 4.0–10.5)

## 2012-07-14 MED ORDER — LEVALBUTEROL HCL 0.63 MG/3ML IN NEBU
0.6300 mg | INHALATION_SOLUTION | Freq: Two times a day (BID) | RESPIRATORY_TRACT | Status: DC
  Administered 2012-07-15 – 2012-07-16 (×2): 0.63 mg via RESPIRATORY_TRACT
  Filled 2012-07-14 (×2): qty 3

## 2012-07-14 MED ORDER — LEVALBUTEROL HCL 0.63 MG/3ML IN NEBU
0.6300 mg | INHALATION_SOLUTION | Freq: Three times a day (TID) | RESPIRATORY_TRACT | Status: DC
  Administered 2012-07-14: 0.63 mg via RESPIRATORY_TRACT
  Filled 2012-07-14: qty 3

## 2012-07-14 MED ORDER — KCL IN DEXTROSE-NACL 20-5-0.9 MEQ/L-%-% IV SOLN
INTRAVENOUS | Status: DC
  Administered 2012-07-14 – 2012-07-16 (×3): via INTRAVENOUS

## 2012-07-14 NOTE — Progress Notes (Signed)
Subjective: The patient had moderate crampy abdominal pain located primarily over the lower abdomen with radiation from the left to the right lower quadrants overnight, but no pain now. Mild nausea but no vomiting. He had a small brown bowel movement overnight, no bright red blood or black tarriness. He denies dizziness or lightheadedness when sitting up and/or sitting on bedside commode.  Objective: Vital signs in last 24 hours: Filed Vitals:   07/14/12 0043 07/14/12 0610 07/14/12 0615 07/14/12 0616  BP: 124/65 111/59 119/81 115/71  Pulse: 103 89 89 89  Temp:  98.4 F (36.9 C)    TempSrc:  Oral    Resp:  18    Height:      Weight:  80.196 kg (176 lb 12.8 oz)    SpO2: 96% 90%      Intake/Output Summary (Last 24 hours) at 07/14/12 1123 Last data filed at 07/14/12 5621  Gross per 24 hour  Intake   1300 ml  Output   1650 ml  Net   -350 ml    Weight change:   Physical exam: General: Pleasant [name] laying in bed, in no acute distress. Lungs: Occasional wheezes. Breathing nonlabored. Heart: S1, S2, with no murmurs rubs or gallops. Abdomen: Hypoactive bowel sounds, soft, mildly to moderately tender hypogastrium and left lower quadrant. Extremities: No pedal edema. Neurologic: He is alert and oriented x3. Cranial nerves II through XII are intact.  Lab Results: Basic Metabolic Panel:  Basename 07/14/12 0615 07/13/12 1146  NA 138 140  K 3.8 3.9  CL 103 103  CO2 28 29  GLUCOSE 107* 106*  BUN 8 6  CREATININE 1.09 1.15  CALCIUM 9.5 10.2  MG -- --  PHOS -- --   Liver Function Tests:  [facility] 07/14/12 0615 07/13/12 1146  AST 14 20  ALT 15 21  ALKPHOS 66 77  BILITOT 1.2 1.0  PROT 5.8* 6.7  ALBUMIN 3.2* 3.8    Basename 07/13/12 1146  LIPASE 30  AMYLASE --   No results found for this basename: AMMONIA:2 in the last 72 hours CBC:  Basename 07/14/12 0615 07/13/12 1146  WBC 8.2 7.8  NEUTROABS -- 5.9  HGB 11.7* 12.8*  HCT 35.6* 38.3*  MCV  102.0* 101.6*  PLT 231 243   Cardiac Enzymes:  Basename 07/13/12 1146  CKTOTAL --  CKMB --  CKMBINDEX --  TROPONINI <0.30   BNP: No results found for this basename: PROBNP:3 in the last 72 hours D-Dimer:  Basename 07/13/12 1146  DDIMER 0.41   CBG: No results found for this basename: GLUCAP:6 in the last 72 hours Hemoglobin A1C: No results found for this basename: HGBA1C in the last 72 hours Fasting Lipid Panel: No results found for this basename: CHOL,HDL,LDLCALC,TRIG,CHOLHDL,LDLDIRECT in the last 72 hours Thyroid Function Tests: No results found for this basename: TSH,T4TOTAL,FREET4,T3FREE,THYROIDAB in the last 72 hours Anemia Panel: No results found for this basename: VITAMINB12,FOLATE,FERRITIN,TIBC,IRON,RETICCTPCT in the last 72 hours Coagulation:  Basename 07/13/12 1146  LABPROT 12.7  INR 0.96   Urine Drug Screen: Drugs of Abuse  No results found for this basename: labopia, cocainscrnur, labbenz, amphetmu, thcu, labbarb    Alcohol Level: No results found for this basename: ETH:2 in the last 72 hours Urinalysis:  Basename 07/13/12 1148  COLORURINE YELLOW  LABSPEC 1.015  PHURINE 7.5  GLUCOSEU NEGATIVE  HGBUR NEGATIVE  BILIRUBINUR NEGATIVE  KETONESUR NEGATIVE  PROTEINUR 30*  UROBILINOGEN 1.0  NITRITE NEGATIVE  LEUKOCYTESUR NEGATIVE   Misc. Labs:   Micro:  No results found for this or any previous visit (from the past 240 hour(s)).  Studies/Results: Dg Chest 2 View  07/13/2012  RADIOLOGY REPORT*  Clinical Data: Syncopal episode, chest pain, 2 stents, smoker, 7 bypasses in 1995  CHEST - 2 VIEW  Comparison:  June 28, 2012  Findings:  There is no focal infiltrate, pulmonary edema, or pleural effusion.  There is a stable 3.7 mm calcified granuloma in the left lung base unchanged. The mediastinal contour and cardiac silhouette are stable.  The patient status post prior CABG and median sternotomy. The visualized soft tissue and skeletal structures are  stable.  IMPRESSION: No acute cardiopulmonary disease.   Original Report Authenticated By: Sherian Rein, M.D.    Ct Abdomen Pelvis W Contrast  07/13/2012  *RADIOLOGY REPORT*  Clinical Data: Left lower quadrant pain.  Syncope.  CT ABDOMEN AND PELVIS WITH CONTRAST  Technique:  Multidetector CT imaging of the abdomen and pelvis was performed following the standard protocol during bolus administration of intravenous contrast.  Contrast: OMNIPAQUE IOHEXOL 300 MG/ML  SOLN  Comparison: None.  Findings: Diverticulosis, bladder wall thickening and mild pericolonic inflammatory changes are seen involving the proximal sigmoid colon, consistent with diverticulitis.  A few tiny extraluminal air bubbles are seen in the adjacent sigmoid mesocolon, consistent with microperforation.  No evidence of drainable abscess or ascites.  No evidence of free intraperitoneal air.  No other inflammatory process or abnormal fluid collections are seen.  No evidence of bowel obstruction.  The abdominal parenchymal organs are normal in appearance.  No evidence of hydronephrosis. Gallbladder is unremarkable.  No soft tissue masses or lymphadenopathy identified.  A small right adrenal mass is seen measuring 1.5 cm which shows significant contrast washout, consistent with a benign adrenal adenoma.  An infrarenal abdominal aortic aneurysm is seen measuring 4.0 cm in maximal diameter.  No evidence of aneurysm leak or rupture.  IMPRESSION:  1.  Moderate sigmoid diverticulitis with microperforation.  No evidence of abscess or free air. 2.  4.0 cm infrarenal abdominal aortic aneurysm.  For aneurysm of this size, continued imaging followup is recommended in 1 year. This recommendation follows ACR consensus guidelines:  White Paper of the ACR Incidental Findings Committee II on Vascular Findings. J Am Coll Radiol [phone]. 3.  Small benign right adrenal adenoma incidentally noted.   Original Report Authenticated By: Myles Rosenthal, M.D.      Medications:  Scheduled:   . [COMPLETED] ciprofloxacin  400 mg Intravenous Once  . ciprofloxacin  400 mg Intravenous Q12H  . clopidogrel  75 mg Oral Daily  . heparin  5,000 Units Subcutaneous Q8H  . metoprolol  5 mg Intravenous Q6H  . [COMPLETED] metronidazole  500 mg Intravenous Once  . metronidazole  500 mg Intravenous Q8H  . nicotine  21 mg Transdermal Daily  . pantoprazole (PROTONIX) IV  40 mg Intravenous Q24H  . [COMPLETED] sodium chloride  1,000 mL Intravenous Once  . [DISCONTINUED] sodium chloride   Intravenous STAT   Continuous:   . 0.9 % NaCl with KCl 20 mEq / L 75 mL/hr at 07/13/12 1601   WJX:[id number], HYDROmorphone (DILAUDID) injection, [COMPLETED] iohexol, ondansetron (ZOFRAN) IV, ondansetron, [DISCONTINUED] ondansetron (ZOFRAN) IV  Assessment: Principal Problem:  *Near syncope Active Problems:  Sigmoid diverticulitis  Perforation of sigmoid colon  TOBACCO ABUSE  HYPERTENSION  CORONARY ATHEROSCLEROSIS NATIVE CORONARY ARTERY  WOLFF (WOLFE)-PARKINSON-WHITE (WPW) SYNDROME  GERD  Abdominal aortic aneurysm  Adrenal adenoma   1. Acute sigmoid diverticulitis with microperforation. The patient is mildly  to moderately symptomatic with pain, but it is being relieved appropriately with IV hydromorphone. We'll continue IV Flagyl and Cipro. Dr. Dian Situ is following.  Near syncope/presyncope. Possibly secondary to acute diverticulitis with microperforation. This is his second presyncope/syncopal episode associated with acute gastrointestinal illnesses. Based on the orthostatic vitals, he is not orthostatic. He is neurologically intact. Per review of telemetry, there were no acute arrhythmias overnight.  Coronary artery disease and mildly decreased systolic function with an ejection fraction of 45-50% per 2-D echocardiogram 06/28/2012. Currently stable and compensated. He is on Plavix orally as tolerated and IV metoprolol.  Incidental finding of abdominal aortic aneurysm.  This will be followed.  Gastroesophageal reflux disease. We'll continue IV Protonix.  Tobacco abuse. The patient was strongly advised to stop smoking. Continue nicotine replacement therapy.  Macrocytic anemia. Will assess for vitamin B12 deficiency.  Plan:  1. We'll hold Plavix due to the nature of the diverticulitis and microperforation and continue subcutaneous heparin for DVT prophylaxis. 2. We'll change IV fluids to include dextrose. 3. Continue ice chips and occasional sips of clear liquids as tolerated without advancement. 4. We'll check a vitamin B12 level to rule out deficiency. 5. Continue supportive treatment. 6. Further recommendations per Dr. Leticia Penna.   LOS: 1 day   Tyteanna Ost 07/14/2012, 11:23 AM

## 2012-07-15 ENCOUNTER — Encounter (HOSPITAL_COMMUNITY): Payer: Self-pay | Admitting: Internal Medicine

## 2012-07-15 DIAGNOSIS — D519 Vitamin B12 deficiency anemia, unspecified: Secondary | ICD-10-CM | POA: Diagnosis present

## 2012-07-15 DIAGNOSIS — D518 Other vitamin B12 deficiency anemias: Secondary | ICD-10-CM

## 2012-07-15 LAB — BASIC METABOLIC PANEL
BUN: 9 mg/dL (ref 6–23)
CO2: 28 mEq/L (ref 19–32)
Calcium: 9.8 mg/dL (ref 8.4–10.5)
Creatinine, Ser: 1.02 mg/dL (ref 0.50–1.35)
GFR calc non Af Amer: 79 mL/min — ABNORMAL LOW (ref >90)
Glucose, Bld: 118 mg/dL — ABNORMAL HIGH (ref 70–99)
Sodium: 137 mEq/L (ref 135–145)

## 2012-07-15 MED ORDER — METOPROLOL TARTRATE 25 MG PO TABS
12.5000 mg | ORAL_TABLET | Freq: Two times a day (BID) | ORAL | Status: DC
  Administered 2012-07-15 – 2012-07-16 (×3): 12.5 mg via ORAL
  Filled 2012-07-15 (×3): qty 1

## 2012-07-15 MED ORDER — ZOLPIDEM TARTRATE 5 MG PO TABS
5.0000 mg | ORAL_TABLET | Freq: Every day | ORAL | Status: DC
  Administered 2012-07-15: 5 mg via ORAL
  Filled 2012-07-15: qty 1

## 2012-07-15 MED ORDER — CYANOCOBALAMIN 1000 MCG/ML IJ SOLN
1000.0000 ug | Freq: Every day | INTRAMUSCULAR | Status: DC
  Administered 2012-07-15 – 2012-07-16 (×2): 1000 ug via INTRAMUSCULAR
  Filled 2012-07-15 (×2): qty 1

## 2012-07-15 NOTE — Progress Notes (Signed)
Subjective: The patient has less crampy abdominal pain. No bowel movement since yesterday. No headache or dizziness or feelings of passing out.  Objective: Vital signs in last 24 hours: Filed Vitals:   07/15/12 0451 07/15/12 0556 07/15/12 0604 07/15/12 0700  BP: 107/64 114/71 128/76   Pulse: 83 94 97 95  Temp: 97.9 F (36.6 C) 97.9 F (36.6 C) 97.6 F (36.4 C)   TempSrc: Oral Oral Oral   Resp: 18 18 18    Height:      Weight: 78.608 kg (173 lb 4.8 oz)  78.971 kg (174 lb 1.6 oz)   SpO2: 90% 90% 90% 91%    Intake/Output Summary (Last 24 hours) at 07/15/12 1022 Last data filed at 07/15/12 0800  Gross per 24 hour  Intake      0 ml  Output   1750 ml  Net  -1750 ml    Weight change: 3.765 kg (8 lb 4.8 oz)  Physical exam: General: Pleasant [name] laying in bed, in no acute distress. Lungs: Occasional wheezes. Breathing nonlabored. Heart: S1, S2, with no murmurs rubs or gallops. Abdomen: Hypoactive bowel sounds, soft, mildly tender hypogastrium and left lower quadrant. Extremities: No pedal edema. Neurologic: He is alert and oriented x3. Cranial nerves II through XII are intact.  Lab Results: Basic Metabolic Panel:  Basename 07/15/12 0604 07/14/12 0615  NA 137 138  K 3.7 3.8  CL 100 103  CO2 28 28  GLUCOSE 118* 107*  BUN 9 8  CREATININE 1.02 1.09  CALCIUM 9.8 9.5  MG -- --  PHOS -- --   Liver Function Tests:  [facility] 07/14/12 0615 07/13/12 1146  AST 14 20  ALT 15 21  ALKPHOS 66 77  BILITOT 1.2 1.0  PROT 5.8* 6.7  ALBUMIN 3.2* 3.8    Basename 07/13/12 1146  LIPASE 30  AMYLASE --   No results found for this basename: AMMONIA:2 in the last 72 hours CBC:  Basename 07/14/12 0615 07/13/12 1146  WBC 8.2 7.8  NEUTROABS -- 5.9  HGB 11.7* 12.8*  HCT 35.6* 38.3*  MCV 102.0* 101.6*  PLT 231 243   Cardiac Enzymes:  Basename 07/13/12 1146  CKTOTAL --  CKMB --  CKMBINDEX --  TROPONINI <0.30   BNP:  Basename 07/15/12 0604  PROBNP  347.5*   D-Dimer:  Basename 07/13/12 1146  DDIMER 0.41   CBG: No results found for this basename: GLUCAP:6 in the last 72 hours Hemoglobin A1C: No results found for this basename: HGBA1C in the last 72 hours Fasting Lipid Panel:  Basename 07/14/12 0615  CHOL 111  HDL 26*  LDLCALC 65  TRIG 99  CHOLHDL 4.3  LDLDIRECT --   Thyroid Function Tests: No results found for this basename: TSH,T4TOTAL,FREET4,T3FREE,THYROIDAB in the last 72 hours Anemia Panel:  Basename 07/14/12 1138  VITAMINB12 139*  FOLATE --  FERRITIN --  TIBC --  IRON --  RETICCTPCT --   Coagulation:  Basename 07/13/12 1146  LABPROT 12.7  INR 0.96   Urine Drug Screen: Drugs of Abuse  No results found for this basename: labopia,  cocainscrnur,  labbenz,  amphetmu,  thcu,  labbarb    Alcohol Level: No results found for this basename: ETH:2 in the last 72 hours Urinalysis:  Basename 07/13/12 1148  COLORURINE YELLOW  LABSPEC 1.015  PHURINE 7.5  GLUCOSEU NEGATIVE  HGBUR NEGATIVE  BILIRUBINUR NEGATIVE  KETONESUR NEGATIVE  PROTEINUR 30*  UROBILINOGEN 1.0  NITRITE NEGATIVE  LEUKOCYTESUR NEGATIVE   Misc. Labs:  Micro: No results found for this or any previous visit (from the past 240 hour(s)).  Studies/Results: Dg Chest 2 View  07/13/2012  RADIOLOGY REPORT*  Clinical Data: Syncopal episode, chest pain, 2 stents, smoker, 7 bypasses in 1995  CHEST - 2 VIEW  Comparison:  June 28, 2012  Findings:  There is no focal infiltrate, pulmonary edema, or pleural effusion.  There is a stable 3.7 mm calcified granuloma in the left lung base unchanged. The mediastinal contour and cardiac silhouette are stable.  The patient status post prior CABG and median sternotomy. The visualized soft tissue and skeletal structures are stable.  IMPRESSION: No acute cardiopulmonary disease.   Original Report Authenticated By: Sherian Rein, M.D.    Ct Abdomen Pelvis W Contrast  07/13/2012  *RADIOLOGY REPORT*  Clinical  Data: Left lower quadrant pain.  Syncope.  CT ABDOMEN AND PELVIS WITH CONTRAST  Technique:  Multidetector CT imaging of the abdomen and pelvis was performed following the standard protocol during bolus administration of intravenous contrast.  Contrast: OMNIPAQUE IOHEXOL 300 MG/ML  SOLN  Comparison: None.  Findings: Diverticulosis, bladder wall thickening and mild pericolonic inflammatory changes are seen involving the proximal sigmoid colon, consistent with diverticulitis.  A few tiny extraluminal air bubbles are seen in the adjacent sigmoid mesocolon, consistent with microperforation.  No evidence of drainable abscess or ascites.  No evidence of free intraperitoneal air.  No other inflammatory process or abnormal fluid collections are seen.  No evidence of bowel obstruction.  The abdominal parenchymal organs are normal in appearance.  No evidence of hydronephrosis. Gallbladder is unremarkable.  No soft tissue masses or lymphadenopathy identified.  A small right adrenal mass is seen measuring 1.5 cm which shows significant contrast washout, consistent with a benign adrenal adenoma.  An infrarenal abdominal aortic aneurysm is seen measuring 4.0 cm in maximal diameter.  No evidence of aneurysm leak or rupture.  IMPRESSION:  1.  Moderate sigmoid diverticulitis with microperforation.  No evidence of abscess or free air. 2.  4.0 cm infrarenal abdominal aortic aneurysm.  For aneurysm of this size, continued imaging followup is recommended in 1 year. This recommendation follows ACR consensus guidelines:  White Paper of the ACR Incidental Findings Committee II on Vascular Findings. J Am Coll Radiol [phone]. 3.  Small benign right adrenal adenoma incidentally noted.   Original Report Authenticated By: Myles Rosenthal, M.D.     Medications:  Scheduled:    . ciprofloxacin  400 mg Intravenous Q12H  . cyanocobalamin  1,000 mcg Intramuscular Daily  . heparin  5,000 Units Subcutaneous Q8H  . levalbuterol  0.63  mg Nebulization BID  . metoprolol tartrate  12.5 mg Oral BID  . metronidazole  500 mg Intravenous Q8H  . nicotine  21 mg Transdermal Daily  . pantoprazole (PROTONIX) IV  40 mg Intravenous Q24H  . [DISCONTINUED] clopidogrel  75 mg Oral Daily  . [DISCONTINUED] levalbuterol  0.63 mg Nebulization Q8H  . [DISCONTINUED] metoprolol  5 mg Intravenous Q6H   Continuous:    . dextrose 5 % and 0.9 % NaCl with KCl 20 mEq/L 70 mL/hr at 07/14/12 1230  . [DISCONTINUED] 0.9 % NaCl with KCl 20 mEq / L 75 mL/hr at 07/13/12 1601   XBM:[id number], HYDROmorphone (DILAUDID) injection, ondansetron (ZOFRAN) IV, ondansetron  Assessment: Principal Problem:  *Near syncope Active Problems:  Sigmoid diverticulitis  Perforation of sigmoid colon  TOBACCO ABUSE  HYPERTENSION  CORONARY ATHEROSCLEROSIS NATIVE CORONARY ARTERY  WOLFF (WOLFE)-PARKINSON-WHITE (WPW) SYNDROME  GERD  Abdominal aortic aneurysm  Adrenal adenoma  Macrocytic anemia  Vitamin B12 deficiency anemia   1. Acute sigmoid diverticulitis with microperforation. The patient is less symptomatic with pain. He is afebrile and his white blood cell count is within normal limits. We'll continue IV Flagyl and Cipro. Dr. Leticia Penna is following.  Near syncope/presyncope. Possibly secondary to acute diverticulitis with microperforation. This is his second presyncopal/syncopal episode associated with acute gastrointestinal illnesses. Based on the orthostatic vitals, he is not orthostatic. He is neurologically intact. Per review of telemetry, there has been no acute arrhythmias overnight.  Coronary artery disease and mildly decreased systolic function with an ejection fraction of 45-50% per 2-D echocardiogram 06/28/2012. Currently stable and compensated. Plavix is on hold. Metoprolol was transitioned to by mouth.  Incidental finding of abdominal aortic aneurysm. This will be followed.  Gastroesophageal reflux disease. We'll continue IV Protonix.  Tobacco abuse.  The patient was strongly advised to stop smoking. Continue nicotine replacement therapy and Xopenex for mild bronchospasms.  Macrocytic anemia, secondary to newly diagnosed vitamin B 12 deficiency.  Plan:  1. We'll start clear liquid diet. 2. Await further recommendations by Dr. Leticia Penna. 3. The nursing staff was asked to ambulate the patient in the hallway. 4. The patient was instructed to accept subcutaneous heparin for DVT prophylaxis, as apparently, he had been refusing per his account. 5. Vitamin B12 injections ordered and started.   LOS: 2 days   [name] 07/15/2012, 10:22 AM

## 2012-07-15 NOTE — Progress Notes (Signed)
  Subjective: Pain better.  No nausea.  No fever or chills.  Tolerating clears.  Objective: Vital signs in last 24 hours: Temp:  [97.6 F (36.4 C)-99.7 F (37.6 C)] 97.6 F (36.4 C) (11/24 0604) Pulse Rate:  [83-108] 95  (11/24 0700) Resp:  [18] 18  (11/24 0604) BP: (107-128)/(63-76) 128/76 mmHg (11/24 0604) SpO2:  [90 %-94 %] 91 % (11/24 0700) Weight:  [78.608 kg (173 lb 4.8 oz)-78.971 kg (174 lb 1.6 oz)] 78.971 kg (174 lb 1.6 oz) (11/24 0604) Last BM Date: 07/14/12  Intake/Output from previous day: 11/23 0701 - 11/24 0700 In: 0  Out: 1750 [Urine:1750] Intake/Output this shift: Total I/O In: 0  Out: 850 [Urine:850]  General appearance: alert and no distress GI: soft, non-tender; bowel sounds normal; no masses,  no organomegaly  Lab Results:   [facility] 07/14/12 0615 07/13/12 1146  WBC 8.2 7.8  HGB 11.7* 12.8*  HCT 35.6* 38.3*  PLT 231 243   BMET  Basename 07/15/12 0604 07/14/12 0615  NA 137 138  K 3.7 3.8  CL 100 103  CO2 28 28  GLUCOSE 118* 107*  BUN 9 8  CREATININE 1.02 1.09  CALCIUM 9.8 9.5   PT/INR  Basename 07/13/12 1146  LABPROT 12.7  INR 0.96   ABG No results found for this basename: PHART:2,PCO2:2,PO2:2,HCO3:2 in the last 72 hours  Studies/Results: No results found.  Anti-infectives: Anti-infectives     Start     Dose/Rate Route Frequency Ordered Stop   07/14/12 0200   ciprofloxacin (CIPRO) IVPB 400 mg        400 mg 200 mL/hr over 60 Minutes Intravenous Every 12 hours 07/13/12 1603     07/13/12 2200   metroNIDAZOLE (FLAGYL) IVPB 500 mg        500 mg 100 mL/hr over 60 Minutes Intravenous Every 8 hours 07/13/12 1603     07/13/12 1330   ciprofloxacin (CIPRO) IVPB 400 mg        400 mg 200 mL/hr over 60 Minutes Intravenous  Once 07/13/12 1328 07/13/12 1444   07/13/12 1330   metroNIDAZOLE (FLAGYL) IVPB 500 mg        500 mg 100 mL/hr over 60 Minutes Intravenous  Once 07/13/12 1328 07/13/12 1438          Assessment/Plan: s/p * No  surgery found * Acute diverticulitis.  Continue advancing diet as tolerated.  Continue IV abx 24 hours.  IF tolerating regular diet in am, ok to d/c from my standpoint tomorrow.   LOS: 2 days    Abbas Beyene C 07/15/2012

## 2012-07-15 NOTE — Progress Notes (Signed)
Patient ambulating in hall ways with stand by assist.  Tolerating well

## 2012-07-16 ENCOUNTER — Encounter (HOSPITAL_COMMUNITY): Payer: Self-pay | Admitting: Internal Medicine

## 2012-07-16 DIAGNOSIS — F17200 Nicotine dependence, unspecified, uncomplicated: Secondary | ICD-10-CM

## 2012-07-16 MED ORDER — ONDANSETRON HCL 4 MG PO TABS: 4.0000 mg | ORAL_TABLET | Freq: Three times a day (TID) | ORAL | Status: DC | PRN

## 2012-07-16 MED ORDER — CIPROFLOXACIN HCL 500 MG PO TABS: 500.0000 mg | ORAL_TABLET | Freq: Two times a day (BID) | ORAL | Status: DC

## 2012-07-16 MED ORDER — VITAMIN B-12 1000 MCG PO TABS: 1000.0000 ug | ORAL_TABLET | Freq: Every day | ORAL | Status: DC

## 2012-07-16 MED ORDER — HYDROCODONE-ACETAMINOPHEN 5-500 MG PO TABS: 1.0000 | ORAL_TABLET | Freq: Four times a day (QID) | ORAL | Status: DC | PRN

## 2012-07-16 MED ORDER — METRONIDAZOLE 500 MG PO TABS: 500.0000 mg | ORAL_TABLET | Freq: Three times a day (TID) | ORAL | Status: DC

## 2012-07-16 MED ORDER — NICOTINE 21 MG/24HR TD PT24: 1.0000 | MEDICATED_PATCH | Freq: Every day | TRANSDERMAL | Status: DC

## 2012-07-16 NOTE — Progress Notes (Signed)
UR Chart Review Completed  

## 2012-07-16 NOTE — Progress Notes (Signed)
Pt discharged via w/c to personal vehicle driven by friend. Discharge instructions and meds reviewed by RN with good pt understanding. Currently voices no c/o pain or discomfort.

## 2012-07-16 NOTE — Discharge Summary (Signed)
Physician Discharge Summary  [name] ZOX:[id number] DOB: 1951/05/27 DOA: 07/13/2012  PCP: Cassell Smiles., MD  Admit date: 07/13/2012 Discharge date: 07/16/2012  Time spent: Greater than 30  minutes  Recommendations for Outpatient Follow-up:  1. The patient will followup with Dr. Dian Situ in one to 2 weeks. Referral to gastroenterology for an elective colonoscopy is recommended. 2. The patient will followup with Dr. Sherwood Gambler as scheduled. 3. The patient will need vitamin B12 injections at least once monthly for 6-12 months and then reassess. 4. Incidental finding of abdominal aortic aneurysm. It needs to be monitored.  Discharge Diagnoses:  1. Acute sigmoid diverticulitis with microperforation. 2. Near-syncope, likely secondary to #1 or its vasovagal effects. 3. Newly diagnosed vitamin B 12 deficiency. 4. 4.0 cm abdominal aortic aneurysm. 5. As above finding of adrenal adenoma. 6. Tobacco abuse. 7. Hypertension. 8. Coronary artery disease. 9. History of Wolff-Parkinson-White syndrome.   Discharge Condition: Improved and stable.  Diet recommendation: Soft, bland, or full liquids for the next few days and then gradually increase solid foods as tolerated.  Filed Weights   07/15/12 0604 07/16/12 0537 07/16/12 0635  Weight: 78.971 kg (174 lb 1.6 oz) 79.017 kg (174 lb 3.2 oz) 78.019 kg (172 lb)    History of present illness:  The patient is a 61 year old man with a history significant for [employer] syndrome, coronary artery disease, and a recent hospitalization for syncope secondary to a gastroenteritis, who presented to the emergency department on 07/13/2012 with a chief complaint of nearly passing out and abdominal pain. In the emergency department, he was afebrile and hemodynamically stable. His white blood cell count was within normal limits at 7.8. His hemoglobin was 12.8 with an MCV of 101.6. His electrolytes and liver transaminases were essentially normal. CT scan  of his abdomen and pelvis revealed moderate sigmoid diverticulitis with microperforation but no evidence of abscess or free air. It also revealed a 4.0 cm infrarenal abdominal aortic aneurysm and a small benign right adrenal adenoma. He was admitted for further evaluation and management. For additional details, please see the dictated history and physical.  Hospital Course:  General surgeon Dr. Leticia Penna was consulted from the emergency department. Following his evaluation, the patient was admitted for medical management of acute diverticulitis with microperforation. There was no surgical indication per Dr. Leticia Penna. The patient was started empirically on IV Cipro and metronidazole. IV fluids were started for hydration. He was made to be virtually n.p.o. Proton pump inhibitor therapy was continued intravenously with Protonix. As needed opiate analgesics and as needed antiemetics were ordered for pain and nausea. Several additional laboratory studies were ordered for evaluation. His troponin I was essentially negative x3. His magnesium level was within normal limits. His TSH was within normal limits at 1.35. His fasting lipid profile revealed a total cholesterol of 111, triglycerides of 99, HDL cholesterol 26, and LDL cholesterol of 65. His pro BNP was 348. Subsequent BNP was slightly more elevated. There was no sign of congestive heart failure. He was noted to have macrocytosis with mild anemia. Therefore, a vitamin B12 level was ordered. It was low at 139. He was started on IM vitamin B12 injections. He will need further supplementation with IM injections in the outpatient setting. This will be deferred to his primary care physician.  Neurologically, he was monitored closely. He demonstrated no signs or symptoms consistent with an acute cerebrovascular event. He was alert and oriented daily. His cranial nerves and strength were well within normal limits. There was  no demonstratable arrhythmias on telemetry. His  presyncope was thought to be secondary to the acute infection and potential vasovagal effects.  A nicotine patch was placed. The patient was strongly advised to stop smoking. Tobacco cessation counseling was ordered.  The patient's pain and nausea subsided and didn't completely resolve. A clear liquid diet was started which he tolerated well. It was advanced to a full liquid diet and then to a soft diet. He tolerated the advancement well. He had several loose stools, but nonbloody or black or tarry.  At the time of discharge, he was afebrile and hemodynamically stable. He received IV Cipro and metronidazole throughout the hospitalization. It was transitioned to by mouth. He was prescribed both to be continued in the outpatient setting for 9 more days for treatment. He will followup with Dr. Leticia Penna. The patient will need an elective colonoscopy sometime in the near future for further evaluation.  Procedures:  None  Consultations:  General surgeon, Tilford Pillar, M.D.  Discharge Exam: Filed Vitals:   07/16/12 0537 07/16/12 0539 07/16/12 0540 07/16/12 0635  BP: 110/64 115/74 119/72   Pulse: 80 87 90   Temp: 98.5 F (36.9 C)   98.8 F (37.1 C)  TempSrc:      Resp: 16     Height:      Weight: 79.017 kg (174 lb 3.2 oz)   78.019 kg (172 lb)  SpO2: 91%       General: Pleasant alert 61 year old man in no acute distress. Cardiovascular: S1, S2, with no murmurs rubs or gallops. Respiratory: Clear to auscultation bilaterally. Abdomen: Positive bowel sounds, soft, nontender, nondistended.  Discharge Instructions  Discharge Orders    Future Appointments: Provider: Department: Dept Phone: Center:   08/27/2012 3:00 PM Jonelle Sidle, MD [facility] [phone] [id number]     Future Orders Please Complete By Expires   Diet - low sodium heart healthy      Increase activity slowly      Discharge instructions      Comments:   Eat a soft bland diet or full liquids  for the next few days and then gradually increase to more solid foods. Stop smoking. Discuss vitamin B12 injections with your primary care physician at your followup appointment.       Medication List     As of 07/16/2012 11:42 AM    TAKE these medications         ALTACE 2.5 MG capsule   Generic drug: ramipril   TAKE (1) CAPSULE BY MOUTH ONCE DAILY.      aspirin 325 MG tablet   Take 325 mg by mouth daily.      ciprofloxacin 500 MG tablet   Commonly known as: CIPRO   Take 1 tablet (500 mg total) by mouth 2 (two) times daily. Take for 9 more days for treatment of diverticulitis.      LIPITOR 80 MG tablet   Generic drug: atorvastatin   TAKE 1 TABLET BY MOUTH ONCE DAILY FOR CHOLESTEROL.      metoprolol tartrate 25 MG tablet   Commonly known as: LOPRESSOR   TAKE (1) TABLET BY MOUTH TWICE DAILY.      metroNIDAZOLE 500 MG tablet   Commonly known as: FLAGYL   Take 1 tablet (500 mg total) by mouth 3 (three) times daily. Take for 9 more days for treatment of diverticulitis.      nicotine 21 mg/24hr patch   Commonly known as: NICODERM CQ - dosed in mg/24 hours  Place 1 patch onto the skin daily. Use as directed on the instructions on the label.      PLAVIX 75 MG tablet   Generic drug: clopidogrel   TAKE ONE TABLET BY MOUTH ONCE DAILY.      PROTONIX 40 MG tablet   Generic drug: pantoprazole   TAKE 1 TABLET BY MOUTH ONCE DAILY FOR ACID REFLUX.      TRICOR 145 MG tablet   Generic drug: fenofibrate   TAKE 1 TABLET BY MOUTH ONCE DAILY FOR CHOLESTEROL.      vitamin B-12 1000 MCG tablet   Commonly known as: CYANOCOBALAMIN   Take 1 tablet (1,000 mcg total) by mouth daily.           Follow-up Information    Follow up with ZIEGLER,BRENT C, MD. In 2 weeks.   Contact information:   [address] [place] [address] [phone]       Follow up with Cassell Smiles., MD. (Followup as previously scheduled.)    Contact information:   [address] [place] [address] [phone]           The results of significant diagnostics from this hospitalization (including imaging, microbiology, ancillary and laboratory) are listed below for reference.    Significant Diagnostic Studies: Dg Chest 2 View  07/13/2012  RADIOLOGY REPORT*  Clinical Data: Syncopal episode, chest pain, 2 stents, smoker, 7 bypasses in 1995  CHEST - 2 VIEW  Comparison:  June 28, 2012  Findings:  There is no focal infiltrate, pulmonary edema, or pleural effusion.  There is a stable 3.7 mm calcified granuloma in the left lung base unchanged. The mediastinal contour and cardiac silhouette are stable.  The patient status post prior CABG and median sternotomy. The visualized soft tissue and skeletal structures are stable.  IMPRESSION: No acute cardiopulmonary disease.   Original Report Authenticated By: Sherian Rein, M.D.    Ct Abdomen Pelvis W Contrast  07/13/2012  *RADIOLOGY REPORT*  Clinical Data: Left lower quadrant pain.  Syncope.  CT ABDOMEN AND PELVIS WITH CONTRAST  Technique:  Multidetector CT imaging of the abdomen and pelvis was performed following the standard protocol during bolus administration of intravenous contrast.  Contrast: OMNIPAQUE IOHEXOL 300 MG/ML  SOLN  Comparison: None.  Findings: Diverticulosis, bladder wall thickening and mild pericolonic inflammatory changes are seen involving the proximal sigmoid colon, consistent with diverticulitis.  A few tiny extraluminal air bubbles are seen in the adjacent sigmoid mesocolon, consistent with microperforation.  No evidence of drainable abscess or ascites.  No evidence of free intraperitoneal air.  No other inflammatory process or abnormal fluid collections are seen.  No evidence of bowel obstruction.  The abdominal parenchymal organs are normal in appearance.  No evidence of hydronephrosis. Gallbladder is unremarkable.  No soft tissue masses or lymphadenopathy identified.  A small right adrenal  mass is seen measuring 1.5 cm which shows significant contrast washout, consistent with a benign adrenal adenoma.  An infrarenal abdominal aortic aneurysm is seen measuring 4.0 cm in maximal diameter.  No evidence of aneurysm leak or rupture.  IMPRESSION:  1.  Moderate sigmoid diverticulitis with microperforation.  No evidence of abscess or free air. 2.  4.0 cm infrarenal abdominal aortic aneurysm.  For aneurysm of this size, continued imaging followup is recommended in 1 year. This recommendation follows ACR consensus guidelines:  White Paper of the ACR Incidental Findings Committee II on Vascular Findings. J Am Coll Radiol [phone]. 3.  Small benign  right adrenal adenoma incidentally noted.   Original Report Authenticated By: Myles Rosenthal, M.D.    Dg Chest Port 1 View  06/28/2012  *RADIOLOGY REPORT*  Clinical Data: Fever; loss of consciousness.  PORTABLE CHEST - 1 VIEW  Comparison: Chest radiograph performed 03/02/2006  Findings: The lungs are well-aerated and clear.  There is no evidence of focal opacification, pleural effusion or pneumothorax.  The cardiomediastinal silhouette is normal in size; the patient is status post median sternotomy.  No acute osseous abnormalities are seen.  IMPRESSION: No acute cardiopulmonary process seen.   Original Report Authenticated By: Tonia Ghent, M.D.     Microbiology: No results found for this or any previous visit (from the past 240 hour(s)).   Labs: Basic Metabolic Panel:  Lab 07/15/12 8295 07/14/12 0615 07/13/12 1146  NA 137 138 140  K 3.7 3.8 3.9  CL 100 103 103  CO2 28 28 29   GLUCOSE 118* 107* 106*  BUN 9 8 6   CREATININE 1.02 1.09 1.15  CALCIUM 9.8 9.5 10.2  MG -- -- --  PHOS -- -- --   Liver Function Tests:  Lab 07/14/12 0615 07/13/12 1146  AST 14 20  ALT 15 21  ALKPHOS 66 77  BILITOT 1.2 1.0  PROT 5.8* 6.7  ALBUMIN 3.2* 3.8    Lab 07/13/12 1146  LIPASE 30  AMYLASE --   No results found for this basename: AMMONIA:5 in the  last 168 hours CBC:  Lab 07/14/12 0615 07/13/12 1146  WBC 8.2 7.8  NEUTROABS -- 5.9  HGB 11.7* 12.8*  HCT 35.6* 38.3*  MCV 102.0* 101.6*  PLT 231 243   Cardiac Enzymes:  Lab 07/13/12 1146  CKTOTAL --  CKMB --  CKMBINDEX --  TROPONINI <0.30   BNP: BNP (last 3 results)  Basename 07/15/12 0604 06/28/12 0456  PROBNP 347.5* 239.5*   CBG: No results found for this basename: GLUCAP:5 in the last 168 hours     Signed:  Leanne Sisler  [facility] 07/16/2012, 11:42 AM

## 2012-08-27 ENCOUNTER — Encounter: Payer: Medicare Other | Admitting: Cardiology

## 2012-09-04 ENCOUNTER — Ambulatory Visit (INDEPENDENT_AMBULATORY_CARE_PROVIDER_SITE_OTHER): Payer: Medicare Other | Admitting: Cardiology

## 2012-09-04 ENCOUNTER — Encounter: Payer: Self-pay | Admitting: Cardiology

## 2012-09-04 VITALS — BP 120/70 | HR 61 | Ht 68.0 in | Wt 178.1 lb

## 2012-09-04 DIAGNOSIS — I714 Abdominal aortic aneurysm, without rupture: Secondary | ICD-10-CM

## 2012-09-04 DIAGNOSIS — I2510 Atherosclerotic heart disease of native coronary artery without angina pectoris: Secondary | ICD-10-CM

## 2012-09-04 DIAGNOSIS — I10 Essential (primary) hypertension: Secondary | ICD-10-CM

## 2012-09-04 DIAGNOSIS — E785 Hyperlipidemia, unspecified: Secondary | ICD-10-CM

## 2012-09-04 NOTE — Assessment & Plan Note (Signed)
No active angina on medical therapy, LVEF stable by followup echocardiogram in November 2013 at 45-50%. No changes made today.

## 2012-09-04 NOTE — Assessment & Plan Note (Signed)
Asymptomatic 4 cm infrarenal AAA recently documented. Will reassess at next visit.

## 2012-09-04 NOTE — Patient Instructions (Addendum)
Your physician recommends that you schedule a follow-up appointment in: ONE YEAR WITH SM   

## 2012-09-04 NOTE — Assessment & Plan Note (Signed)
Blood pressure control is good today. 

## 2012-09-04 NOTE — Progress Notes (Signed)
Clinical Summary [name] is a medically complex 62 y.o.male presenting for followup. I last saw him in March 2013. Record review finds hospital admission in November 2013 with sigmoid diverticulitis associated with microperforation, vasovagal syncope, incidental finding of 4 cm infrarenal AAA. He was managed conservatively by surgery service. Followup echocardiogram at that time demonstrated mild LVH with LVEF 45-50%, mild mitral regurgitation.  He states that he has been doing well since discharge, no significant abdominal pain or chest pain. He has been watching his diet very carefully. Reports compliance with his medications.  Lab work from November 2013 showed total cholesterol 111, triglycerides 99, HDL 26, LDL 65.   Allergies  Allergen Reactions  . Codeine     Current Outpatient Prescriptions  Medication Sig Dispense Refill  . ALTACE 2.5 MG capsule TAKE (1) CAPSULE BY MOUTH ONCE DAILY.  30 each  12  . aspirin 325 MG tablet Take 325 mg by mouth daily.        Marland Kitchen LIPITOR 80 MG tablet TAKE 1 TABLET BY MOUTH ONCE DAILY FOR CHOLESTEROL.  30 each  12  . metoprolol tartrate (LOPRESSOR) 25 MG tablet TAKE (1) TABLET BY MOUTH TWICE DAILY.  60 tablet  6  . PLAVIX 75 MG tablet TAKE ONE TABLET BY MOUTH ONCE DAILY.  90 tablet  3  . PROTONIX 40 MG tablet TAKE 1 TABLET BY MOUTH ONCE DAILY FOR ACID REFLUX.  30 each  12  . TRICOR 145 MG tablet TAKE 1 TABLET BY MOUTH ONCE DAILY FOR CHOLESTEROL.  30 each  12    Past Medical History  Diagnosis Date  . Wolff-Parkinson-White (WPW) syndrome     Failed ablation  . Coronary artery disease     Multivessel, LVEF 50%, occluded SVG to RCA, DES SVG to OM system 2007  . Hyperlipidemia   . Hypertension   . Noncompliance   . Syncope 06/28/2012    Acute gastroenteritis; vasovagal.  . Abdominal aortic aneurysm 07/13/2012    4.0 cm.  . Adrenal adenoma 07/13/2012  . Vitamin B12 deficiency anemia 07/15/2012  . Systolic dysfunction 06/28/2012    EF 45-50%      Social History [name] reports that he has been smoking Cigarettes.  He has a 20 pack-year smoking history. He has never used smokeless tobacco. [name] reports that he does not drink alcohol.  Review of Systems No palpitations or syncope. No melena or hematochezia. Otherwise negative.  Physical Examination Filed Vitals:   09/04/12 1441  BP: 120/70  Pulse: 61   Filed Weights   09/04/12 1441  Weight: 178 lb 1.3 oz (80.777 kg)    Overweight male in no acute distress. No active chest pain.  HEENT: Conjunctiva and lids normal, oropharynx with poor dentition.  Neck: Supple, no JVP or thyromegaly.  Lungs: Coarse, diminished breath sounds, nonlabored, no wheezing.  Cardiac: Regular rate and rhythm, soft midsystolic murmur at the apex no S3.  Abdomen: Soft, nontender, bowel sounds present.  Skin: Warm and dry.  Extremities: No pitting edema.  Neuropsychiatric: Alert and oriented x3, affect grossly appropriate.   Problem List and Plan   CORONARY ATHEROSCLEROSIS NATIVE CORONARY ARTERY No active angina on medical therapy, LVEF stable by followup echocardiogram in November 2013 at 45-50%. No changes made today.  Essential hypertension, benign Blood pressure control is good today.  Abdominal aortic aneurysm Asymptomatic 4 cm infrarenal AAA recently documented. Will reassess at next visit.  HYPERLIPIDEMIA Lipids well-controlled in November, LDL 65 on statin.  Satira Sark, M.D., F.A.C.C.

## 2012-09-04 NOTE — Assessment & Plan Note (Signed)
Lipids well-controlled in November, LDL 65 on statin.

## 2013-01-21 ENCOUNTER — Other Ambulatory Visit: Payer: Self-pay | Admitting: Cardiology

## 2013-01-22 ENCOUNTER — Other Ambulatory Visit: Payer: Self-pay | Admitting: *Deleted

## 2013-01-22 MED ORDER — RAMIPRIL 2.5 MG PO CAPS: ORAL_CAPSULE | ORAL | Status: DC

## 2013-01-22 MED ORDER — PANTOPRAZOLE SODIUM 40 MG PO TBEC: DELAYED_RELEASE_TABLET | ORAL | Status: DC

## 2013-02-20 ENCOUNTER — Other Ambulatory Visit: Payer: Self-pay | Admitting: Cardiology

## 2013-02-20 NOTE — Telephone Encounter (Signed)
Medication sent via escribe for metoprolol tartrate (LOPRESSOR) 25 MG tablet.

## 2013-02-21 ENCOUNTER — Other Ambulatory Visit: Payer: Self-pay | Admitting: Cardiology

## 2013-02-21 NOTE — Telephone Encounter (Signed)
Medication sent via escribe for atorvastatin (LIPITOR) 80 MG tablet and fenofibrate (TRICOR) 145 MG tablet.

## 2013-03-22 ENCOUNTER — Other Ambulatory Visit: Payer: Self-pay | Admitting: Cardiology

## 2013-03-22 NOTE — Telephone Encounter (Signed)
Medication sent via escribe.  

## 2013-08-19 ENCOUNTER — Encounter: Payer: Self-pay | Admitting: Cardiology

## 2013-08-21 ENCOUNTER — Other Ambulatory Visit: Payer: Self-pay | Admitting: Cardiology

## 2013-09-23 ENCOUNTER — Other Ambulatory Visit: Payer: Self-pay | Admitting: Cardiology

## 2013-09-23 MED ORDER — RAMIPRIL 2.5 MG PO CAPS: ORAL_CAPSULE | ORAL | Status: DC

## 2013-12-19 ENCOUNTER — Other Ambulatory Visit: Payer: Self-pay | Admitting: Cardiology

## 2014-03-24 ENCOUNTER — Telehealth: Payer: Self-pay | Admitting: Cardiology

## 2014-03-24 MED ORDER — FENOFIBRATE 145 MG PO TABS: 145.0000 mg | ORAL_TABLET | Freq: Every day | ORAL | Status: DC

## 2014-03-24 NOTE — Telephone Encounter (Signed)
Refill request complete 

## 2014-03-24 NOTE — Telephone Encounter (Signed)
Received fax refill request  Rx # [id number] Medication:  Fenofibrate 145 mg tabs Qty 30 Sig:  Take one tablet by mouth once daily for cholestrol Physician:  [name]

## 2014-03-24 NOTE — Telephone Encounter (Signed)
Received fax refill request  Rx # [id number] Medication:  Atorvastatin 80 mg tablets Qty 30 Sig:  Take one tablet by mouth once daily for cholestrol Physician:  [name]

## 2014-04-23 ENCOUNTER — Other Ambulatory Visit: Payer: Self-pay | Admitting: Cardiology

## 2014-04-24 ENCOUNTER — Encounter: Payer: Self-pay | Admitting: Cardiology

## 2014-04-24 ENCOUNTER — Ambulatory Visit (INDEPENDENT_AMBULATORY_CARE_PROVIDER_SITE_OTHER): Payer: Medicare Other | Admitting: Cardiology

## 2014-04-24 VITALS — BP 130/70 | HR 84 | Ht 68.0 in | Wt 184.0 lb

## 2014-04-24 DIAGNOSIS — I2510 Atherosclerotic heart disease of native coronary artery without angina pectoris: Secondary | ICD-10-CM

## 2014-04-24 DIAGNOSIS — I714 Abdominal aortic aneurysm, without rupture, unspecified: Secondary | ICD-10-CM

## 2014-04-24 DIAGNOSIS — I10 Essential (primary) hypertension: Secondary | ICD-10-CM

## 2014-04-24 DIAGNOSIS — I456 Pre-excitation syndrome: Secondary | ICD-10-CM

## 2014-04-24 DIAGNOSIS — I718 Aortic aneurysm of unspecified site, ruptured: Secondary | ICD-10-CM

## 2014-04-24 DIAGNOSIS — E782 Mixed hyperlipidemia: Secondary | ICD-10-CM

## 2014-04-24 NOTE — Assessment & Plan Note (Signed)
No change in antihypertensive regimen. 

## 2014-04-24 NOTE — Patient Instructions (Signed)
Your physician wants you to follow-up in: 6 months You will receive a reminder letter in the mail two months in advance. If you don't receive a letter, please call our office to schedule the follow-up appointment.   Your physician recommends that you continue on your current medications as directed. Please refer to the Current Medication list given to you today.    Your physician has requested that you have an abdominal aorta duplex. During this test, an ultrasound is used to evaluate the aorta. Allow 30 minutes for this exam. Do not eat after midnight the day before and avoid carbonated beverages     Thank you for choosing [facility] !

## 2014-04-24 NOTE — Assessment & Plan Note (Signed)
Symptomatically stable on medical therapy. ECG reviewed. No changes made to current regimen.

## 2014-04-24 NOTE — Progress Notes (Signed)
Clinical Summary [name] is a medically complex 63 y.o.male last seen in January 2014. He reports no angina symptoms or progressive shortness of breath. Not exercising regularly. Reports problems with chronic back pain. He will be seeing Dr. Hilma Favors for a routine visit next month. Not certain about last lipid panel, may have been within the last year.  ECG today shows sinus rhythm with leftward axis and nonspecific T-wave changes, possible old inferior infarct pattern.  Patient had a 4 cm infrarenal abdominal aortic aneurysm documented by CT back in November 2013.  Echocardiogram from November 2013 revealed mild LVH with inferior hypokinesis, LVEF 45-50%, mild mitral regurgitation, mild left atrial enlargement, trivial pericardial effusion.  Allergies  Allergen Reactions  . Codeine     Current Outpatient Prescriptions  Medication Sig Dispense Refill  . aspirin 325 MG tablet Take 325 mg by mouth daily.        Marland Kitchen atorvastatin (LIPITOR) 80 MG tablet TAKE 1 TABLET BY MOUTH ONCE DAILY FOR CHOLESTEROL.  30 tablet  3  . clopidogrel (PLAVIX) 75 MG tablet TAKE ONE TABLET BY MOUTH ONCE DAILY.  90 tablet  3  . fenofibrate (TRICOR) 145 MG tablet Take 1 tablet (145 mg total) by mouth daily.  90 tablet  3  . metoprolol tartrate (LOPRESSOR) 25 MG tablet TAKE (1) TABLET BY MOUTH TWICE DAILY.  60 tablet  11  . pantoprazole (PROTONIX) 40 MG tablet TAKE 1 TABLET BY MOUTH ONCE DAILY FOR ACID REFLUX.  30 tablet  11  . ramipril (ALTACE) 2.5 MG capsule TAKE (1) CAPSULE BY MOUTH ONCE DAILY.  30 capsule  11   No current facility-administered medications for this visit.    Past Medical History  Diagnosis Date  . Wolff-Parkinson-White (WPW) syndrome     Failed ablation  . Coronary artery disease     Multivessel, LVEF 50%, occluded SVG to RCA, DES SVG to OM system 2007  . Hyperlipidemia   . Hypertension   . Noncompliance   . Syncope 06/28/2012    Acute gastroenteritis; vasovagal.  . Abdominal aortic  aneurysm 07/13/2012    4.0 cm.  . Adrenal adenoma 07/13/2012  . Vitamin B12 deficiency anemia 07/15/2012  . Systolic dysfunction 36/01/4402    EF 45-50%    Past Surgical History  Procedure Laterality Date  . Coronary artery bypass graft  1995    LIMA to LAD , SVG to diagonal, SVG to OM1 and OM2 , SVG to RCA   . Bladder surgery      Social History [name] reports that he has been smoking Cigarettes.  He has a 20 pack-year smoking history. He has never used smokeless tobacco. [name] reports that he does not drink alcohol.  Review of Systems No palpitations or syncope. Eats one meal a day. Occasional hemorrhoid bleeding. Other systems reviewed and negative.  Physical Examination Filed Vitals:   04/24/14 1012  BP: 130/70  Pulse: 84   Filed Weights   04/24/14 1012  Weight: 184 lb (83.462 kg)    Overweight male in no acute distress. No active chest pain.  HEENT: Conjunctiva and lids normal, oropharynx with poor dentition.  Neck: Supple, no JVP or thyromegaly.  Lungs: Coarse, diminished breath sounds, nonlabored, no wheezing.  Cardiac: Regular rate and rhythm, soft midsystolic murmur at the apex no S3.  Abdomen: Soft, nontender, bowel sounds present.  Skin: Warm and dry.  Extremities: No pitting edema.  Neuropsychiatric: Alert and oriented x3, affect grossly appropriate.   Problem List  and Plan   CORONARY ATHEROSCLEROSIS NATIVE CORONARY ARTERY Symptomatically stable on medical therapy. ECG reviewed. No changes made to current regimen.  Abdominal aortic aneurysm Asymptomatic 4 cm infrarenal abdominal aortic aneurysm by CT in November 2013. Followup ultrasound be obtained for surveillance.  Essential hypertension, benign No change in antihypertensive regimen.  HYPERLIPIDEMIA Will request most recent lipid panel.    Satira Sark, M.D., F.A.C.C.

## 2014-04-24 NOTE — Assessment & Plan Note (Signed)
Will request most recent lipid panel.

## 2014-04-24 NOTE — Assessment & Plan Note (Signed)
Asymptomatic 4 cm infrarenal abdominal aortic aneurysm by CT in November 2013. Followup ultrasound be obtained for surveillance.

## 2014-04-29 ENCOUNTER — Ambulatory Visit (HOSPITAL_COMMUNITY)
Admission: RE | Admit: 2014-04-29 | Discharge: 2014-04-29 | Disposition: A | Discharge status: Home / Self Care | Payer: Medicare Other | Source: Ambulatory Visit | Attending: Cardiology | Admitting: Cardiology

## 2014-04-29 ENCOUNTER — Telehealth: Payer: Self-pay

## 2014-04-29 DIAGNOSIS — I714 Abdominal aortic aneurysm, without rupture, unspecified: Secondary | ICD-10-CM | POA: Diagnosis present

## 2014-04-29 NOTE — Telephone Encounter (Signed)
Spoke with pt,he understands to have US done just before 6 month fu in march with Dr.McDowell Have sent message to front desk to place in recall Order placed for March 2016 abd [place]

## 2014-04-29 NOTE — Telephone Encounter (Signed)
Message copied by Bernita Raisin on Tue Apr 29, 2014  3:23 PM ------      Message from: Satira Sark      Created: Tue Apr 29, 2014  2:08 PM       Reviewed report. There has been some increase in size of his AAA, AP versus transverse maximal diameter in the mid section of 3.7 cm x 4.9 cm. We should repeat an abdominal ultrasound just prior to his next visit in 6 months. ------

## 2014-07-24 ENCOUNTER — Other Ambulatory Visit: Payer: Self-pay | Admitting: Cardiology

## 2014-07-24 MED ORDER — ATORVASTATIN CALCIUM 80 MG PO TABS: ORAL_TABLET | ORAL | Status: DC

## 2014-07-24 NOTE — Telephone Encounter (Signed)
Received fax refill request  Rx # [id number] Medication:  Atorvastatin 80 mg tablets Qty 30 Sig:  Take one tablet by mouth once daily for cholestrol Physician:  [name]

## 2014-09-23 ENCOUNTER — Other Ambulatory Visit: Payer: Self-pay | Admitting: Cardiology

## 2014-11-20 ENCOUNTER — Other Ambulatory Visit: Payer: Self-pay | Admitting: Cardiology

## 2014-12-22 ENCOUNTER — Other Ambulatory Visit: Payer: Self-pay | Admitting: Cardiology

## 2014-12-29 ENCOUNTER — Ambulatory Visit (HOSPITAL_COMMUNITY)
Admission: RE | Admit: 2014-12-29 | Discharge: 2014-12-29 | Disposition: A | Discharge status: Home / Self Care | Payer: Medicare Other | Source: Ambulatory Visit | Attending: Cardiology | Admitting: Cardiology

## 2014-12-29 ENCOUNTER — Telehealth: Payer: Self-pay

## 2014-12-29 DIAGNOSIS — I714 Abdominal aortic aneurysm, without rupture, unspecified: Secondary | ICD-10-CM

## 2014-12-29 NOTE — Telephone Encounter (Signed)
-----   Message from Satira Sark, MD sent at 12/29/2014 11:09 AM EDT ----- Reviewed report. Abdominal aortic aneurysm now measures 4.3 x 4.9 cm demonstrating enlargement in the anteroposterior size since last study. Would recommend vascular surgery consultation for further assessment and management.

## 2014-12-29 NOTE — Telephone Encounter (Signed)
Pt made aware,ref placed to [facility]

## 2015-01-05 ENCOUNTER — Encounter: Payer: Self-pay | Admitting: Cardiology

## 2015-01-05 ENCOUNTER — Ambulatory Visit (INDEPENDENT_AMBULATORY_CARE_PROVIDER_SITE_OTHER): Payer: Medicare Other | Admitting: Cardiology

## 2015-01-05 VITALS — BP 126/74 | HR 77 | Ht 68.0 in | Wt 185.0 lb

## 2015-01-05 DIAGNOSIS — I2510 Atherosclerotic heart disease of native coronary artery without angina pectoris: Secondary | ICD-10-CM | POA: Diagnosis not present

## 2015-01-05 DIAGNOSIS — I456 Pre-excitation syndrome: Secondary | ICD-10-CM

## 2015-01-05 DIAGNOSIS — E782 Mixed hyperlipidemia: Secondary | ICD-10-CM | POA: Diagnosis not present

## 2015-01-05 DIAGNOSIS — I714 Abdominal aortic aneurysm, without rupture, unspecified: Secondary | ICD-10-CM

## 2015-01-05 NOTE — Progress Notes (Signed)
Cardiology Office Note  Date: 01/05/2015   ID: ALWALEED OBESO, DOB 1950/10/23, MRN [id number]  PCP: [name], MD  Primary Cardiologist: Rozann Lesches, MD   Chief Complaint  Patient presents with  . Coronary Artery Disease  . AAA    History of Present Illness: [name] is a 64 y.o. male last seen in September 2015. He presents for a routine cardiac visit. Overall, no major changes in functional capacity since I last saw him. He reports exertional dyspnea, no angina. Medications are outlined below. He does have intermittent palpitations, but states that he holds his breath to help symptoms go away. He has not had any recurring syncope.  Patient recently had a follow-up abdominal ultrasound to reassess AAA, results noted below demonstrating progression in size. We have already made a referral to vascular surgery. He will be seeing Dr. Oneida Alar on June 9.   Past Medical History  Diagnosis Date  . Wolff-Parkinson-White (WPW) syndrome     Failed ablation  . Coronary artery disease     Multivessel, LVEF 50%, occluded SVG to RCA, DES SVG to OM system 2007  . Hyperlipidemia   . Hypertension   . Noncompliance   . Syncope 06/28/2012    Acute gastroenteritis; vasovagal.  . Abdominal aortic aneurysm 07/13/2012    4.0 cm.  . Adrenal adenoma 07/13/2012  . Vitamin B12 deficiency anemia 07/15/2012  . Systolic dysfunction 52/02/7823    EF 45-50%    Past Surgical History  Procedure Laterality Date  . Coronary artery bypass graft  1995    LIMA to LAD , SVG to diagonal, SVG to OM1 and OM2 , SVG to RCA   . Bladder surgery      Current Outpatient Prescriptions  Medication Sig Dispense Refill  . aspirin 325 MG tablet Take 325 mg by mouth daily.      Marland Kitchen atorvastatin (LIPITOR) 80 MG tablet TAKE 1 TABLET BY MOUTH ONCE DAILY FOR CHOLESTEROL. 30 tablet 6  . clopidogrel (PLAVIX) 75 MG tablet TAKE ONE TABLET BY MOUTH ONCE DAILY. 90 tablet 2  . fenofibrate (TRICOR) 145 MG tablet  Take 1 tablet (145 mg total) by mouth daily. 90 tablet 3  . metoprolol tartrate (LOPRESSOR) 25 MG tablet TAKE (1) TABLET BY MOUTH TWICE DAILY. 180 tablet 3  . pantoprazole (PROTONIX) 40 MG tablet TAKE 1 TABLET BY MOUTH ONCE DAILY FOR ACID REFLUX. 90 tablet 3  . ramipril (ALTACE) 2.5 MG capsule TAKE (1) CAPSULE BY MOUTH ONCE DAILY. 90 capsule 3   No current facility-administered medications for this visit.    Allergies:  Codeine   Social History: The patient  reports that he has been smoking Cigarettes.  He has a 40 pack-year smoking history. He has never used smokeless tobacco. He reports that he does not drink alcohol or use illicit drugs.   ROS:  Please see the history of present illness. Otherwise, complete review of systems is positive for abdominal bloating.  All other systems are reviewed and negative.   Physical Exam: VS:  BP 126/74 mmHg  Pulse 77  Ht 5\' 8"  (1.727 m)  Wt 185 lb (83.915 kg)  BMI 28.14 kg/m2  SpO2 98%, BMI Body mass index is 28.14 kg/(m^2).  Wt Readings from Last 3 Encounters:  01/05/15 185 lb (83.915 kg)  04/24/14 184 lb (83.462 kg)  09/04/12 178 lb 1.3 oz (80.777 kg)     General: Patient appears comfortable at rest. HEENT: Conjunctiva and lids normal, oropharynx clear with  moist mucosa. Neck: Supple, no elevated JVP or carotid bruits, no thyromegaly. Lungs: Clear to auscultation, nonlabored breathing at rest. Cardiac: Regular rate and rhythm, no S3 or significant systolic murmur, no pericardial rub. Abdomen: Soft, nontender, no hepatomegaly, bowel sounds present, no guarding or rebound. Extremities: No pitting edema, distal pulses 2+. Skin: Warm and dry. Musculoskeletal: No kyphosis. Neuropsychiatric: Alert and oriented x3, affect grossly appropriate.   ECG: ECG is not ordered today.  Other Studies Reviewed Today:  1. Echocardiogram 06/28/2012: Study Conclusions  - Left ventricle: Mid inferior and inferobasal wall hypokinesis The cavity size  was normal. Wall thickness was increased in a pattern of mild LVH. Systolic function was mildly reduced. The estimated ejection fraction was in the range of 45% to 50%. - Mitral valve: Mild regurgitation. - Left atrium: The atrium was mildly dilated. - Atrial septum: No defect or patent foramen ovale was identified. - Pericardium, extracardiac: A trivial pericardial effusion was identified posterior to the heart. - Impressions: EF stable and consistant with old myovue showed EF in 45% range and inferior scar Impressions:  - EF stable and consistant with old myovue showed EF in 45% range and inferior scar.  2. Abdominal ultrasound 12/29/2014: FINDINGS: Abdominal Aorta  4.3 x 4.9 cm fusiform mid abdominal aortic aneurysm. This is enlarged from prior study of 04/29/2014 at which time it measured 3.7 cm by 4.9 cm  Maximum AP  Diameter: 4.3 cm  Maximum TRV  Diameter: 4.9 cm  IMPRESSION: Interim enlargement of abdominal aortic aneurysm. Greater than 5 mm growth over the past 12 months is associated with an increased risk of aneurysm rupture. Recommend vascular surgery referral/consultation if not already obtained.   ASSESSMENT AND PLAN:  1. Ischemic heart disease status post CABG, LVEF 45-50% by last assessment. We will continue current medical regimen. Follow-up ischemic testing will need to be obtained prior to his proceeding with any potential vascular interventions.  2. Abdominal aortic aneurysm, increased size to 4.3 x 4.9 cm. Referral has been made to vascular surgery, he will be seeing Dr. Oneida Alar on June 9.  3. History of WPW status post failed ablation in the past. Has only brief palpitations. Continue on beta blocker.  4. Hyperlipidemia, on statin therapy. Keep follow-up with Dr. Hilma Favors for lab work later this summer.  Current medicines were reviewed at length with the patient today.   Disposition: FU with me in 6  months.   Signed, Satira Sark, MD, [facility] 01/05/2015 12:02 PM    [place] at [facility] [address] [place], [place] [address] Phone: [phone]; Fax: [phone]

## 2015-01-05 NOTE — Patient Instructions (Signed)
Your physician wants you to follow-up in: 6 months with dr [name] - November 2016 You will receive a reminder letter in the mail two months in advance. If you don't receive a letter, please call our office to schedule the follow-up appointment.    Your physician recommends that you continue on your current medications as directed. Please refer to the Current Medication list given to you today.      Thank you for choosing [place] !

## 2015-01-15 ENCOUNTER — Encounter: Payer: Medicare Other | Admitting: Vascular Surgery

## 2015-01-26 ENCOUNTER — Encounter: Payer: Self-pay | Admitting: Vascular Surgery

## 2015-01-27 ENCOUNTER — Encounter: Payer: Self-pay | Admitting: Vascular Surgery

## 2015-01-28 ENCOUNTER — Encounter: Payer: Self-pay | Admitting: Vascular Surgery

## 2015-01-28 ENCOUNTER — Ambulatory Visit (INDEPENDENT_AMBULATORY_CARE_PROVIDER_SITE_OTHER): Payer: Medicare Other | Admitting: Vascular Surgery

## 2015-01-28 VITALS — BP 124/71 | HR 70 | Ht 68.0 in | Wt 186.8 lb

## 2015-01-28 DIAGNOSIS — I714 Abdominal aortic aneurysm, without rupture, unspecified: Secondary | ICD-10-CM

## 2015-01-28 DIAGNOSIS — Z48812 Encounter for surgical aftercare following surgery on the circulatory system: Secondary | ICD-10-CM

## 2015-01-28 NOTE — Progress Notes (Signed)
[facility] [place] HISTORY AND PHYSICAL  Referring: Dr. [name] History of Present Illness:  Patient is a 64 y.o. year old male who presents for evaluation of asymptomatic abdominal aortic aneurysm. The patient has had a known aneurysm since 2013. It has slowly grown. Recent ultrasound September 2015 showed aortic diameter was 4.9 cm. A recent ultrasound in May 2016 it was also 4.9 cm. Patient denies any abdominal pain. He does have chronic back pain. He denies family history of abdominal aortic aneurysm. He does have a family history of cerebral aneurysms. He currently smokes 1 pack of cigarettes per day. Greater than 3 minutes today spent regarding smoking cessation. He states he is under too much stress to quit smoking. Other medical problems include Wolff-Parkinson-White, coronary artery disease, hyperlipidemia, hypertension, COPD all of which are currently stable. He is on aspirin and Plavix.  Past Medical History  Diagnosis Date  . Wolff-Parkinson-White (WPW) syndrome     Failed ablation  . Coronary artery disease     Multivessel, LVEF 50%, occluded SVG to RCA, DES SVG to OM system 2007  . Hyperlipidemia   . Hypertension   . Noncompliance   . Syncope 06/28/2012    Acute gastroenteritis; vasovagal.  . Abdominal aortic aneurysm 07/13/2012    4.0 cm.  . Adrenal adenoma 07/13/2012  . Vitamin B12 deficiency anemia 07/15/2012  . Systolic dysfunction 34/08/9377    EF 45-50%  . COPD (chronic obstructive pulmonary disease)   . CAD (coronary artery disease)     Past Surgical History  Procedure Laterality Date  . Coronary artery bypass graft  1995    LIMA to LAD , SVG to diagonal, SVG to OM1 and OM2 , SVG to RCA   . Bladder surgery      Social History History  Substance Use Topics  . Smoking status: Current Every Day Smoker -- 1.00 packs/day for 40 years    Types: Cigarettes  . Smokeless tobacco: Never Used     Comment: 10 ciggarettes a a day  . Alcohol Use:  No    Family History Family History  Problem Relation Age of Onset  . Varicose Veins Mother   . Heart disease Father   . Heart attack Father   . Bleeding Disorder Sister   . Peripheral vascular disease Sister     amputation  . Diabetes Brother   . Varicose Veins Brother   . Diabetes Brother   . Heart disease Brother   . Varicose Veins Brother     Allergies  Allergies  Allergen Reactions  . Codeine      Current Outpatient Prescriptions  Medication Sig Dispense Refill  . aspirin 325 MG tablet Take 325 mg by mouth daily.      Marland Kitchen atorvastatin (LIPITOR) 80 MG tablet TAKE 1 TABLET BY MOUTH ONCE DAILY FOR CHOLESTEROL. 30 tablet 6  . clopidogrel (PLAVIX) 75 MG tablet TAKE ONE TABLET BY MOUTH ONCE DAILY. 90 tablet 2  . fenofibrate (TRICOR) 145 MG tablet Take 1 tablet (145 mg total) by mouth daily. 90 tablet 3  . metoprolol tartrate (LOPRESSOR) 25 MG tablet TAKE (1) TABLET BY MOUTH TWICE DAILY. 180 tablet 3  . pantoprazole (PROTONIX) 40 MG tablet TAKE 1 TABLET BY MOUTH ONCE DAILY FOR ACID REFLUX. 90 tablet 3  . ramipril (ALTACE) 2.5 MG capsule TAKE (1) CAPSULE BY MOUTH ONCE DAILY. 90 capsule 3   No current facility-administered medications for this visit.    ROS:   General:  No weight  loss, Fever, chills  HEENT: No recent headaches, no nasal bleeding, no visual changes, no sore throat  Neurologic: No dizziness, blackouts, seizures. No recent symptoms of stroke or mini- stroke. No recent episodes of slurred speech, or temporary blindness.  Cardiac: No recent episodes of chest pain/pressure, no shortness of breath at rest.  + shortness of breath with exertion.  + irregular heartbeat  Vascular: No history of rest pain in feet.  No history of claudication.  No history of non-healing ulcer, No history of DVT   Pulmonary: No home oxygen, no productive cough, no hemoptysis,  + wheezing  Musculoskeletal:  [ ]  Arthritis, [ ]  Low back pain,  [ ]  Joint pain  Hematologic:No history  of hypercoagulable state.  No history of easy bleeding.  No history of anemia  Gastrointestinal: No hematochezia or melena,  No gastroesophageal reflux, no trouble swallowing  Urinary: [ ]  chronic Kidney disease, [ ]  on HD - [ ]  MWF or [ ]  TTHS, [ ]  Burning with urination, [ ]  Frequent urination, [ ]  Difficulty urinating;   Skin: No rashes  Psychological: No history of anxiety,  No history of depression   Physical Examination  Filed Vitals:   01/28/15 1002  BP: 124/71  Pulse: 70  Height: 5\' 8"  (1.727 m)  Weight: 84.732 kg (186 lb 12.8 oz)  SpO2: 99%    Body mass index is 28.41 kg/(m^2).  General:  Alert and oriented, no acute distress HEENT: Normal Neck: No bruit or JVD Pulmonary: Clear to auscultation bilaterally Cardiac: Regular Rate and Rhythm without murmur Abdomen: Soft, non-tender, non-distended, no mass Skin: No rash Extremity Pulses:  2+ radial, brachial, 1+ left femoral, 2+ right femoral 2+ dorsalis pedis, posterior tibial pulses bilaterally, right saphenous vein leg harvest site below knee Musculoskeletal: No deformity or edema  Neurologic: Upper and lower extremity motor 5/5 and symmetric  DATA:  Ultrasound of aorta reviewed. This shows the aneurysm is 4.9 cm in diameter.   ASSESSMENT:  Patient with 4.9 cm abdominal aortic aneurysm currently asymptomatic   PLAN:  We'll obtain CT angiogram the abdomen and pelvis to further define his aortic anatomy. If the aneurysm is greater than 5-1/2 cm in diameter we would consider repair at that point.  Ruta Hinds, MD [facility] [place] Office: [phone] Pager: [phone]

## 2015-01-29 ENCOUNTER — Encounter: Payer: Medicare Other | Admitting: Vascular Surgery

## 2015-01-29 NOTE — Addendum Note (Signed)
Addended by: Dorthula Rue L on: 01/29/2015 05:56 PM   Modules accepted: Orders

## 2015-02-03 NOTE — Addendum Note (Signed)
Addended by: Dorthula Rue L on: 02/03/2015 11:03 AM   Modules accepted: Orders

## 2015-02-20 ENCOUNTER — Other Ambulatory Visit: Payer: Self-pay | Admitting: Vascular Surgery

## 2015-02-20 ENCOUNTER — Other Ambulatory Visit: Payer: Self-pay | Admitting: Cardiology

## 2015-02-20 DIAGNOSIS — Z48812 Encounter for surgical aftercare following surgery on the circulatory system: Secondary | ICD-10-CM | POA: Diagnosis not present

## 2015-02-20 DIAGNOSIS — I714 Abdominal aortic aneurysm, without rupture: Secondary | ICD-10-CM | POA: Diagnosis not present

## 2015-02-20 DIAGNOSIS — Z01812 Encounter for preprocedural laboratory examination: Secondary | ICD-10-CM | POA: Diagnosis not present

## 2015-02-20 LAB — BUN: BUN: 8 mg/dL (ref 6–23)

## 2015-02-20 LAB — CREATININE, SERUM: CREATININE: 1.02 mg/dL (ref 0.50–1.35)

## 2015-02-25 ENCOUNTER — Ambulatory Visit (HOSPITAL_COMMUNITY)
Admission: RE | Admit: 2015-02-25 | Discharge: 2015-02-25 | Disposition: A | Discharge status: Home / Self Care | Payer: Medicare Other | Source: Ambulatory Visit | Attending: Vascular Surgery | Admitting: Vascular Surgery

## 2015-02-25 ENCOUNTER — Encounter (HOSPITAL_COMMUNITY): Payer: Self-pay

## 2015-02-25 DIAGNOSIS — I714 Abdominal aortic aneurysm, without rupture, unspecified: Secondary | ICD-10-CM

## 2015-02-25 DIAGNOSIS — Z48812 Encounter for surgical aftercare following surgery on the circulatory system: Secondary | ICD-10-CM

## 2015-02-25 MED ORDER — IOHEXOL 350 MG/ML SOLN
100.0000 mL | Freq: Once | INTRAVENOUS | Status: AC | PRN
  Administered 2015-02-25: 100 mL via INTRAVENOUS

## 2015-03-03 ENCOUNTER — Encounter: Payer: Self-pay | Admitting: Vascular Surgery

## 2015-03-05 ENCOUNTER — Encounter: Payer: Self-pay | Admitting: Vascular Surgery

## 2015-03-05 ENCOUNTER — Ambulatory Visit (INDEPENDENT_AMBULATORY_CARE_PROVIDER_SITE_OTHER): Payer: Medicare Other | Admitting: Vascular Surgery

## 2015-03-05 VITALS — BP 140/80 | HR 74 | Temp 97.8°F | Resp 14 | Ht 68.0 in | Wt 187.0 lb

## 2015-03-05 DIAGNOSIS — I714 Abdominal aortic aneurysm, without rupture, unspecified: Secondary | ICD-10-CM

## 2015-03-05 NOTE — Addendum Note (Signed)
Addended by: Dorthula Rue L on: 03/05/2015 01:58 PM   Modules accepted: Orders

## 2015-03-05 NOTE — Progress Notes (Signed)
[facility] [place] HISTORY AND PHYSICAL   Referring: Dr. [name] History of Present Illness:  Patient is a 64 y.o. year old male who presents for evaluation of asymptomatic abdominal aortic aneurysm. The patient has had a known aneurysm since 2013. It has slowly grown. Recent ultrasound September 2015 showed aortic diameter was 4.9 cm. Patient denies any abdominal pain. He does have chronic back pain.   ROS:    General:  No weight loss, Fever, chills  Cardiac: No recent episodes of chest pain/pressure, no shortness of breath at rest.  + shortness of breath with exertion.  + irregular heartbeat  Pulmonary: No home oxygen, no productive cough, no hemoptysis,  + wheezing  Physical Examination     Filed Vitals:   03/05/15 1238  BP: 140/80  Pulse: 74  Temp: 97.8 F (36.6 C)  TempSrc: Oral  Resp: 14  Height: 5\' 8"  (1.727 m)  Weight: 187 lb (84.823 kg)  SpO2: 99%    General:  Alert and oriented, no acute distress Abdomen: Soft, non-tender, non-distended, no mass, able aortic pulsation in the epigastrium Extremity Pulses:  2+ radial, brachial, 1+ left femoral, 2+ right femoral 2+ dorsalis pedis, posterior tibial pulses bilaterally, right saphenous vein leg harvest site below knee   DATA:  CT Angio the abdomen and pelvis is reviewed today. This shows the aneurysm diameter is 4.6 cm. No iliac artery aneurysm component.   ASSESSMENT:  Patient with 4.6 cm abdominal aortic aneurysm currently asymptomatic   PLAN:  Repeat aortic ultrasound in 6 months time. Consider repair if greater than 5-1/2 cm. Patient was told to report to the emergency room that he has an abdominal aortic aneurysm if he has any severe back or abdominal pain.  Ruta Hinds, MD [facility] [place] Office: [phone] Pager: [phone]

## 2015-03-31 DIAGNOSIS — Z6827 Body mass index (BMI) 27.0-27.9, adult: Secondary | ICD-10-CM | POA: Diagnosis not present

## 2015-03-31 DIAGNOSIS — I10 Essential (primary) hypertension: Secondary | ICD-10-CM | POA: Diagnosis not present

## 2015-03-31 DIAGNOSIS — E663 Overweight: Secondary | ICD-10-CM | POA: Diagnosis not present

## 2015-03-31 DIAGNOSIS — Z1389 Encounter for screening for other disorder: Secondary | ICD-10-CM | POA: Diagnosis not present

## 2015-03-31 DIAGNOSIS — Z0000 Encounter for general adult medical examination without abnormal findings: Secondary | ICD-10-CM | POA: Diagnosis not present

## 2015-03-31 DIAGNOSIS — E782 Mixed hyperlipidemia: Secondary | ICD-10-CM | POA: Diagnosis not present

## 2015-06-24 ENCOUNTER — Other Ambulatory Visit: Payer: Self-pay | Admitting: Cardiology

## 2015-07-06 ENCOUNTER — Encounter: Payer: Self-pay | Admitting: Cardiology

## 2015-07-06 ENCOUNTER — Ambulatory Visit (INDEPENDENT_AMBULATORY_CARE_PROVIDER_SITE_OTHER): Payer: Medicare Other | Admitting: Cardiology

## 2015-07-06 VITALS — BP 122/70 | HR 85 | Ht 68.0 in | Wt 184.4 lb

## 2015-07-06 DIAGNOSIS — I2510 Atherosclerotic heart disease of native coronary artery without angina pectoris: Secondary | ICD-10-CM

## 2015-07-06 DIAGNOSIS — I714 Abdominal aortic aneurysm, without rupture, unspecified: Secondary | ICD-10-CM

## 2015-07-06 DIAGNOSIS — I10 Essential (primary) hypertension: Secondary | ICD-10-CM | POA: Diagnosis not present

## 2015-07-06 DIAGNOSIS — I456 Pre-excitation syndrome: Secondary | ICD-10-CM | POA: Diagnosis not present

## 2015-07-06 MED ORDER — RAMIPRIL 2.5 MG PO CAPS: ORAL_CAPSULE | ORAL | Status: DC

## 2015-07-06 MED ORDER — FENOFIBRATE 145 MG PO TABS: ORAL_TABLET | ORAL | Status: DC

## 2015-07-06 MED ORDER — ATORVASTATIN CALCIUM 80 MG PO TABS: ORAL_TABLET | ORAL | Status: DC

## 2015-07-06 MED ORDER — METOPROLOL TARTRATE 25 MG PO TABS: ORAL_TABLET | ORAL | Status: DC

## 2015-07-06 MED ORDER — CLOPIDOGREL BISULFATE 75 MG PO TABS: 75.0000 mg | ORAL_TABLET | Freq: Every day | ORAL | Status: DC

## 2015-07-06 NOTE — Progress Notes (Signed)
Cardiology Office Note  Date: 07/06/2015   ID: [name], DOB 27-Sep-1950, MRN [id number]  PCP: [name], MD  Primary Cardiologist: Rozann Lesches, MD   Chief Complaint  Patient presents with  . Coronary Artery Disease    History of Present Illness: [name] is a 64 y.o. male last seen in May. He presents for a routine cardiac follow-up.he does not endorse any significant angina symptoms with typical ADLs, reports NYHA class II dyspnea.  We discussed his medications which are outlined below, stable from a cardiac perspective. He requested having 90 day supplies prescribed.  He has had interval follow-up with Dr. Oneida Alar for an asymptomatic 4.6 cm AAA documented most recently by CT angiography. He continues on 6 month surveillance at this time.  ECG today shows sinus rhythm with low voltage in the limb leads.   Past Medical History  Diagnosis Date  . Wolff-Parkinson-White (WPW) syndrome     Failed ablation  . Coronary artery disease     Multivessel, LVEF 50%, occluded SVG to RCA, DES SVG to OM system 2007  . Hyperlipidemia   . Essential hypertension   . Noncompliance   . Syncope     Acute gastroenteritis; vasovagal  . Abdominal aortic aneurysm ([place])   . Adrenal adenoma   . Vitamin B12 deficiency anemia   . Systolic dysfunction     EF 45-50%  . COPD (chronic obstructive pulmonary disease) [facility])     Past Surgical History  Procedure Laterality Date  . Coronary artery bypass graft  1995    LIMA to LAD , SVG to diagonal, SVG to OM1 and OM2 , SVG to RCA   . Bladder surgery      Current Outpatient Prescriptions  Medication Sig Dispense Refill  . aspirin 325 MG tablet Take 325 mg by mouth daily.      Marland Kitchen atorvastatin (LIPITOR) 80 MG tablet TAKE 1 TABLET BY MOUTH ONCE DAILY FOR CHOLESTEROL. 30 tablet 6  . clopidogrel (PLAVIX) 75 MG tablet TAKE ONE TABLET BY MOUTH ONCE DAILY. 90 tablet 1  . fenofibrate (TRICOR) 145 MG tablet TAKE 1 TABLET BY MOUTH  ONCE DAILY FOR CHOLESTEROL. 30 tablet 6  . metoprolol tartrate (LOPRESSOR) 25 MG tablet TAKE (1) TABLET BY MOUTH TWICE DAILY. 180 tablet 3  . pantoprazole (PROTONIX) 40 MG tablet TAKE 1 TABLET BY MOUTH ONCE DAILY FOR ACID REFLUX. 90 tablet 3  . ramipril (ALTACE) 2.5 MG capsule TAKE (1) CAPSULE BY MOUTH ONCE DAILY. 90 capsule 3   No current facility-administered medications for this visit.    Allergies:  Codeine   Social History: The patient  reports that he has been smoking Cigarettes.  He has a 40 pack-year smoking history. He has never used smokeless tobacco. He reports that he does not drink alcohol or use illicit drugs.   ROS:  Please see the history of present illness. Otherwise, complete review of systems is positive for occasional sneezing fits.  All other systems are reviewed and negative.   Physical Exam: VS:  BP 122/70 mmHg  Pulse 85  Ht 5\' 8"  (1.727 m)  Wt 184 lb 6.4 oz (83.643 kg)  BMI 28.04 kg/m2  SpO2 97%, BMI Body mass index is 28.04 kg/(m^2).  Wt Readings from Last 3 Encounters:  07/06/15 184 lb 6.4 oz (83.643 kg)  03/05/15 187 lb (84.823 kg)  01/28/15 186 lb 12.8 oz (84.732 kg)     General: Patient appears comfortable at rest. HEENT: Conjunctiva and lids  normal, oropharynx clear with moist mucosa. Neck: Supple, no elevated JVP or carotid bruits, no thyromegaly. Lungs: Clear to auscultation, nonlabored breathing at rest. Cardiac: Regular rate and rhythm, no S3 or significant systolic murmur, no pericardial rub. Abdomen: Soft, nontender, no hepatomegaly, bowel sounds present, no guarding or rebound. Extremities: No pitting edema, distal pulses 2+.   ECG: ECG is ordered today.  Recent Labwork: 02/20/2015: BUN 8; Creat 1.02   Other Studies Reviewed Today:  Echocardiogram 06/28/2012: Study Conclusions  - Left ventricle: Mid inferior and inferobasal wall hypokinesis The cavity size was normal. Wall thickness was increased in a pattern of mild LVH. Systolic  function was mildly reduced. The estimated ejection fraction was in the range of 45% to 50%. - Mitral valve: Mild regurgitation. - Left atrium: The atrium was mildly dilated. - Atrial septum: No defect or patent foramen ovale was identified. - Pericardium, extracardiac: A trivial pericardial effusion was identified posterior to the heart. - Impressions: EF stable and consistant with old myovue showed EF in 45% range and inferior scar Impressions:  - EF stable and consistant with old myovue showed EF in 45% range and inferior scar.   ASSESSMENT AND PLAN:  1. Symptomatically stable CAD status post CABG in 2007. He has an ischemic cardiomyopathy with LVEF approximately 45-50%. Plan is to continue current medical regimen, 90 day supplies were prescribed. ECG reviewed. Continue regular follow-up.  2. Abdominal aortic aneurysm, asymptomatic, and 4.6 cm by most recent evaluation. He is being followed by Dr. Oneida Alar.  3. Essential hypertension, blood pressure is normal today.  Current medicines were reviewed at length with the patient today.   Orders Placed This Encounter  Procedures  . EKG 12-Lead  . EKG 12-Lead    Disposition: FU with me in 6 months.   Signed, Satira Sark, MD, [facility] 07/06/2015 11:45 AM    [place] at [facility] [address], [place], [place] [address] Phone: [phone]; Fax: [phone]

## 2015-07-06 NOTE — Patient Instructions (Signed)
Your physician wants you to follow-up in: 6 months with Dr Ferne Reus will receive a reminder letter in the mail two months in advance. If you don't receive a letter, please call our office to schedule the follow-up appointment.    I refilled your prescriptions for 90 days    If you need a refill on your cardiac medications before your next appointment, please call your pharmacy.      Thank you for choosing [facility] !

## 2015-08-24 ENCOUNTER — Other Ambulatory Visit: Payer: Self-pay | Admitting: Cardiology

## 2015-09-09 ENCOUNTER — Encounter: Payer: Self-pay | Admitting: Family

## 2015-09-17 ENCOUNTER — Ambulatory Visit: Payer: Medicare Other | Admitting: Family

## 2015-09-17 ENCOUNTER — Other Ambulatory Visit (HOSPITAL_COMMUNITY): Payer: Medicare Other

## 2015-10-09 ENCOUNTER — Encounter: Payer: Self-pay | Admitting: Family

## 2015-10-16 ENCOUNTER — Ambulatory Visit (HOSPITAL_COMMUNITY)
Admission: RE | Admit: 2015-10-16 | Discharge: 2015-10-16 | Disposition: A | Discharge status: Home / Self Care | Payer: Medicare Other | Source: Ambulatory Visit | Attending: Family | Admitting: Family

## 2015-10-16 ENCOUNTER — Ambulatory Visit (INDEPENDENT_AMBULATORY_CARE_PROVIDER_SITE_OTHER): Payer: Medicare Other | Admitting: Family

## 2015-10-16 ENCOUNTER — Encounter: Payer: Self-pay | Admitting: Family

## 2015-10-16 VITALS — BP 130/74 | HR 78 | Ht 68.0 in | Wt 183.7 lb

## 2015-10-16 DIAGNOSIS — Z720 Tobacco use: Secondary | ICD-10-CM | POA: Diagnosis not present

## 2015-10-16 DIAGNOSIS — I714 Abdominal aortic aneurysm, without rupture, unspecified: Secondary | ICD-10-CM

## 2015-10-16 DIAGNOSIS — I10 Essential (primary) hypertension: Secondary | ICD-10-CM | POA: Diagnosis not present

## 2015-10-16 DIAGNOSIS — E785 Hyperlipidemia, unspecified: Secondary | ICD-10-CM | POA: Insufficient documentation

## 2015-10-16 DIAGNOSIS — F17200 Nicotine dependence, unspecified, uncomplicated: Secondary | ICD-10-CM

## 2015-10-16 NOTE — Addendum Note (Signed)
Addended by: Dorthula Rue L on: 10/16/2015 11:17 AM   Modules accepted: Orders

## 2015-10-16 NOTE — Patient Instructions (Addendum)
Abdominal Aortic Aneurysm An aneurysm is a weakened or damaged part of an artery wall that bulges from the normal force of blood pumping through the body. An abdominal aortic aneurysm is an aneurysm that occurs in the lower part of the aorta, the main artery of the body.  The major concern with an abdominal aortic aneurysm is that it can enlarge and burst (rupture) or blood can flow between the layers of the wall of the aorta through a tear (aorticdissection). Both of these conditions can cause bleeding inside the body and can be life threatening unless diagnosed and treated promptly. CAUSES  The exact cause of an abdominal aortic aneurysm is unknown. Some contributing factors are:   A hardening of the arteries caused by the buildup of fat and other substances in the lining of a blood vessel (arteriosclerosis).  Inflammation of the walls of an artery (arteritis).   Connective tissue diseases, such as Marfan syndrome.   Abdominal trauma.   An infection, such as syphilis or staphylococcus, in the wall of the aorta (infectious aortitis) caused by bacteria. RISK FACTORS  Risk factors that contribute to an abdominal aortic aneurysm may include:  Age older than 60 years.   High blood pressure (hypertension).  Male gender.  Ethnicity (white race).  Obesity.  Family history of aneurysm (first degree relatives only).  Tobacco use. PREVENTION  The following healthy lifestyle habits may help decrease your risk of abdominal aortic aneurysm:  Quitting smoking. Smoking can raise your blood pressure and cause arteriosclerosis.  Limiting or avoiding alcohol.  Keeping your blood pressure, blood sugar level, and cholesterol levels within normal limits.  Decreasing your salt intake. In somepeople, too much salt can raise blood pressure and increase your risk of abdominal aortic aneurysm.  Eating a diet low in saturated fats and cholesterol.  Increasing your fiber intake by including  whole grains, vegetables, and fruits in your diet. Eating these foods may help lower blood pressure.  Maintaining a healthy weight.  Staying physically active and exercising regularly. SYMPTOMS  The symptoms of abdominal aortic aneurysm may vary depending on the size and rate of growth of the aneurysm.Most grow slowly and do not have any symptoms. When symptoms do occur, they may include:  Pain (abdomen, side, lower back, or groin). The pain may vary in intensity. A sudden onset of severe pain may indicate that the aneurysm has ruptured.  Feeling full after eating only small amounts of food.  Nausea or vomiting or both.  Feeling a pulsating lump in the abdomen.  Feeling faint or passing out. DIAGNOSIS  Since most unruptured abdominal aortic aneurysms have no symptoms, they are often discovered during diagnostic exams for other conditions. An aneurysm may be found during the following procedures:  Ultrasonography (A one-time screening for abdominal aortic aneurysm by ultrasonography is also recommended for all men aged 65-75 years who have ever smoked).  X-ray exams.  A computed tomography (CT).  Magnetic resonance imaging (MRI).  Angiography or arteriography. TREATMENT  Treatment of an abdominal aortic aneurysm depends on the size of your aneurysm, your age, and risk factors for rupture. Medication to control blood pressure and pain may be used to manage aneurysms smaller than 6 cm. Regular monitoring for enlargement may be recommended by your caregiver if:  The aneurysm is 3-4 cm in size (an annual ultrasonography may be recommended).  The aneurysm is 4-4.5 cm in size (an ultrasonography every 6 months may be recommended).  The aneurysm is larger than 4.5 cm in   size (your caregiver may ask that you be examined by a vascular surgeon). If your aneurysm is larger than 6 cm, surgical repair may be recommended. There are two main methods for repair of an aneurysm:   Endovascular  repair (a minimally invasive surgery). This is done most often.  Open repair. This method is used if an endovascular repair is not possible.   This information is not intended to replace advice given to you by your health care provider. Make sure you discuss any questions you have with your health care provider.   Document Released: 05/18/2005 Document Revised: 12/03/2012 Document Reviewed: 09/07/2012 Elsevier Interactive Patient Education 2016 Elsevier Inc.     Smoking Cessation, Tips for Success If you are ready to quit smoking, congratulations! You have chosen to help yourself be healthier. Cigarettes bring nicotine, tar, carbon monoxide, and other irritants into your body. Your lungs, heart, and blood vessels will be able to work better without these poisons. There are many different ways to quit smoking. Nicotine gum, nicotine patches, a nicotine inhaler, or nicotine nasal spray can help with physical craving. Hypnosis, support groups, and medicines help break the habit of smoking. WHAT THINGS CAN I DO TO MAKE QUITTING EASIER?  Here are some tips to help you quit for good:  Pick a date when you will quit smoking completely. Tell all of your friends and family about your plan to quit on that date.  Do not try to slowly cut down on the number of cigarettes you are smoking. Pick a quit date and quit smoking completely starting on that day.  Throw away all cigarettes.   Clean and remove all ashtrays from your home, work, and car.  On a card, write down your reasons for quitting. Carry the card with you and read it when you get the urge to smoke.  Cleanse your body of nicotine. Drink enough water and fluids to keep your urine clear or pale yellow. Do this after quitting to flush the nicotine from your body.  Learn to predict your moods. Do not let a bad situation be your excuse to have a cigarette. Some situations in your life might tempt you into wanting a cigarette.  Never have  "just one" cigarette. It leads to wanting another and another. Remind yourself of your decision to quit.  Change habits associated with smoking. If you smoked while driving or when feeling stressed, try other activities to replace smoking. Stand up when drinking your coffee. Brush your teeth after eating. Sit in a different chair when you read the paper. Avoid alcohol while trying to quit, and try to drink fewer caffeinated beverages. Alcohol and caffeine may urge you to smoke.  Avoid foods and drinks that can trigger a desire to smoke, such as sugary or spicy foods and alcohol.  Ask people who smoke not to smoke around you.  Have something planned to do right after eating or having a cup of coffee. For example, plan to take a walk or exercise.  Try a relaxation exercise to calm you down and decrease your stress. Remember, you may be tense and nervous for the first 2 weeks after you quit, but this will pass.  Find new activities to keep your hands busy. Play with a pen, coin, or rubber band. Doodle or draw things on paper.  Brush your teeth right after eating. This will help cut down on the craving for the taste of tobacco after meals. You can also try mouthwash.   Use   oral substitutes in place of cigarettes. Try using lemon drops, carrots, cinnamon sticks, or chewing gum. Keep them handy so they are available when you have the urge to smoke.  When you have the urge to smoke, try deep breathing.  Designate your home as a nonsmoking area.  If you are a heavy smoker, ask your health care provider about a prescription for nicotine chewing gum. It can ease your withdrawal from nicotine.  Reward yourself. Set aside the cigarette money you save and buy yourself something nice.  Look for support from others. Join a support group or smoking cessation program. Ask someone at home or at work to help you with your plan to quit smoking.  Always ask yourself, "Do I need this cigarette or is this just  a reflex?" Tell yourself, "Today, I choose not to smoke," or "I do not want to smoke." You are reminding yourself of your decision to quit.  Do not replace cigarette smoking with electronic cigarettes (commonly called e-cigarettes). The safety of e-cigarettes is unknown, and some may contain harmful chemicals.  If you relapse, do not give up! Plan ahead and think about what you will do the next time you get the urge to smoke. HOW WILL I FEEL WHEN I QUIT SMOKING? You may have symptoms of withdrawal because your body is used to nicotine (the addictive substance in cigarettes). You may crave cigarettes, be irritable, feel very hungry, cough often, get headaches, or have difficulty concentrating. The withdrawal symptoms are only temporary. They are strongest when you first quit but will go away within 10-14 days. When withdrawal symptoms occur, stay in control. Think about your reasons for quitting. Remind yourself that these are signs that your body is healing and getting used to being without cigarettes. Remember that withdrawal symptoms are easier to treat than the major diseases that smoking can cause.  Even after the withdrawal is over, expect periodic urges to smoke. However, these cravings are generally short lived and will go away whether you smoke or not. Do not smoke! WHAT RESOURCES ARE AVAILABLE TO HELP ME QUIT SMOKING? Your health care provider can direct you to community resources or hospitals for support, which may include:  Group support.  Education.  Hypnosis.  Therapy.   This information is not intended to replace advice given to you by your health care provider. Make sure you discuss any questions you have with your health care provider.   Document Released: 05/06/2004 Document Revised: 08/29/2014 Document Reviewed: 01/24/2013 Elsevier Interactive Patient Education 2016 Elsevier Inc.    Steps to Quit Smoking  Smoking tobacco can be harmful to your health and can affect  almost every organ in your body. Smoking puts you, and those around you, at risk for developing many serious chronic diseases. Quitting smoking is difficult, but it is one of the best things that you can do for your health. It is never too late to quit. WHAT ARE THE BENEFITS OF QUITTING SMOKING? When you quit smoking, you lower your risk of developing serious diseases and conditions, such as:  Lung cancer or lung disease, such as COPD.  Heart disease.  Stroke.  Heart attack.  Infertility.  Osteoporosis and bone fractures. Additionally, symptoms such as coughing, wheezing, and shortness of breath may get better when you quit. You may also find that you get sick less often because your body is stronger at fighting off colds and infections. If you are pregnant, quitting smoking can help to reduce your chances of having   a baby of low birth weight. HOW DO I GET READY TO QUIT? When you decide to quit smoking, create a plan to make sure that you are successful. Before you quit:  Pick a date to quit. Set a date within the next two weeks to give you time to prepare.  Write down the reasons why you are quitting. Keep this list in places where you will see it often, such as on your bathroom mirror or in your car or wallet.  Identify the people, places, things, and activities that make you want to smoke (triggers) and avoid them. Make sure to take these actions:  Throw away all cigarettes at home, at work, and in your car.  Throw away smoking accessories, such as ashtrays and lighters.  Clean your car and make sure to empty the ashtray.  Clean your home, including curtains and carpets.  Tell your family, friends, and coworkers that you are quitting. Support from your loved ones can make quitting easier.  Talk with your health care provider about your options for quitting smoking.  Find out what treatment options are covered by your health insurance. WHAT STRATEGIES CAN I USE TO QUIT  SMOKING?  Talk with your healthcare provider about different strategies to quit smoking. Some strategies include:  Quitting smoking altogether instead of gradually lessening how much you smoke over a period of time. Research shows that quitting "cold turkey" is more successful than gradually quitting.  Attending in-person counseling to help you build problem-solving skills. You are more likely to have success in quitting if you attend several counseling sessions. Even short sessions of 10 minutes can be effective.  Finding resources and support systems that can help you to quit smoking and remain smoke-free after you quit. These resources are most helpful when you use them often. They can include:  Online chats with a counselor.  Telephone quitlines.  Printed self-help materials.  Support groups or group counseling.  Text messaging programs.  Mobile phone applications.  Taking medicines to help you quit smoking. (If you are pregnant or breastfeeding, talk with your health care provider first.) Some medicines contain nicotine and some do not. Both types of medicines help with cravings, but the medicines that include nicotine help to relieve withdrawal symptoms. Your health care provider may recommend:  Nicotine patches, gum, or lozenges.  Nicotine inhalers or sprays.  Non-nicotine medicine that is taken by mouth. Talk with your health care provider about combining strategies, such as taking medicines while you are also receiving in-person counseling. Using these two strategies together makes you more likely to succeed in quitting than if you used either strategy on its own. If you are pregnant or breastfeeding, talk with your health care provider about finding counseling or other support strategies to quit smoking. Do not take medicine to help you quit smoking unless told to do so by your health care provider. WHAT THINGS CAN I DO TO MAKE IT EASIER TO QUIT? Quitting smoking might feel  overwhelming at first, but there is a lot that you can do to make it easier. Take these important actions:  Reach out to your family and friends and ask that they support and encourage you during this time. Call telephone quitlines, reach out to support groups, or work with a counselor for support.  Ask people who smoke to avoid smoking around you.  Avoid places that trigger you to smoke, such as bars, parties, or smoke-break areas at work.  Spend time around people who do   not smoke.  Lessen stress in your life, because stress can be a smoking trigger for some people. To lessen stress, try:  Exercising regularly.  Deep-breathing exercises.  Yoga.  Meditating.  Performing a body scan. This involves closing your eyes, scanning your body from head to toe, and noticing which parts of your body are particularly tense. Purposefully relax the muscles in those areas.  Download or purchase mobile phone or tablet apps (applications) that can help you stick to your quit plan by providing reminders, tips, and encouragement. There are many free apps, such as QuitGuide from the CDC (Centers for Disease Control and Prevention). You can find other support for quitting smoking (smoking cessation) through smokefree.gov and other websites. HOW WILL I FEEL WHEN I QUIT SMOKING? Within the first 24 hours of quitting smoking, you may start to feel some withdrawal symptoms. These symptoms are usually most noticeable 2-3 days after quitting, but they usually do not last beyond 2-3 weeks. Changes or symptoms that you might experience include:  Mood swings.  Restlessness, anxiety, or irritation.  Difficulty concentrating.  Dizziness.  Strong cravings for sugary foods in addition to nicotine.  Mild weight gain.  Constipation.  Nausea.  Coughing or a sore throat.  Changes in how your medicines work in your body.  A depressed mood.  Difficulty sleeping (insomnia). After the first 2-3 weeks of  quitting, you may start to notice more positive results, such as:  Improved sense of smell and taste.  Decreased coughing and sore throat.  Slower heart rate.  Lower blood pressure.  Clearer skin.  The ability to breathe more easily.  Fewer sick days. Quitting smoking is very challenging for most people. Do not get discouraged if you are not successful the first time. Some people need to make many attempts to quit before they achieve long-term success. Do your best to stick to your quit plan, and talk with your health care provider if you have any questions or concerns.   This information is not intended to replace advice given to you by your health care provider. Make sure you discuss any questions you have with your health care provider.   Document Released: 08/02/2001 Document Revised: 12/23/2014 Document Reviewed: 12/23/2014 Elsevier Interactive Patient Education 2016 Elsevier Inc.  

## 2015-10-16 NOTE — Progress Notes (Signed)
[facility]  Established Abdominal Aortic Aneurysm  History of Present Illness  [name] is a 65 y.o. (May 13, 1951) male patient of Dr. Oneida Alar who presents for evaluation of asymptomatic abdominal aortic aneurysm. The patient has had a known aneurysm since 2013. It has slowly grown. Recent ultrasound September 2015 showed aortic diameter was 4.9 cm. Patient denies any abdominal pain. He does have chronic back pain.   The patient is a smoker. The patient denies claudication in legs with walking. The patient denies any history of stroke or TIA symptoms. He had a CABG x 7 vessels in 1995, he also has 2 cardiac stents, Dr. [name] is his cardiologist.   He takes Plavix, ASA, and a statin on a regular basis.  Pt Diabetic: No Pt smoker: smoker  (3/4 ppd, started at age 79 yrs)  Past Medical History  Diagnosis Date  . Wolff-Parkinson-White (WPW) syndrome     Failed ablation  . Coronary artery disease     Multivessel, LVEF 50%, occluded SVG to RCA, DES SVG to OM system 2007  . Hyperlipidemia   . Essential hypertension   . Noncompliance   . Syncope     Acute gastroenteritis; vasovagal  . Abdominal aortic aneurysm ([place])   . Adrenal adenoma   . Vitamin B12 deficiency anemia   . Systolic dysfunction     EF 45-50%  . COPD (chronic obstructive pulmonary disease) [facility])    Past Surgical History  Procedure Laterality Date  . Coronary artery bypass graft  1995    LIMA to LAD , SVG to diagonal, SVG to OM1 and OM2 , SVG to RCA   . Bladder surgery     Social History Social History   Social History  . Marital Status: Divorced    Spouse Name: N/A  . Number of Children: N/A  . Years of Education: N/A   Occupational History  . Not on file.   Social History Main Topics  . Smoking status: Current Every Day Smoker -- 1.00 packs/day for 40 years    Types: Cigarettes  . Smokeless tobacco: Never Used     Comment: 10 ciggarettes a a day  . Alcohol Use: No   . Drug Use: No  . Sexual Activity: No   Other Topics Concern  . Not on file   Social History Narrative   Family History Family History  Problem Relation Age of Onset  . Varicose Veins Mother   . Heart disease Father   . Heart attack Father   . Bleeding Disorder Sister   . Peripheral vascular disease Sister     amputation  . Diabetes Brother   . Varicose Veins Brother   . Diabetes Brother   . Heart disease Brother   . Varicose Veins Brother     Current Outpatient Prescriptions on File Prior to Visit  Medication Sig Dispense Refill  . aspirin 325 MG tablet Take 325 mg by mouth daily.      Marland Kitchen atorvastatin (LIPITOR) 80 MG tablet TAKE 1 TABLET BY MOUTH ONCE DAILY FOR CHOLESTEROL. 90 tablet 3  . clopidogrel (PLAVIX) 75 MG tablet Take 1 tablet (75 mg total) by mouth daily. 90 tablet 3  . fenofibrate (TRICOR) 145 MG tablet TAKE 1 TABLET BY MOUTH ONCE DAILY FOR CHOLESTEROL. 90 tablet 3  . metoprolol tartrate (LOPRESSOR) 25 MG tablet TAKE (1) TABLET BY MOUTH TWICE DAILY. 180 tablet 3  . pantoprazole (PROTONIX) 40 MG tablet TAKE 1 TABLET BY MOUTH ONCE DAILY  FOR ACID REFLUX. 30 tablet 6  . ramipril (ALTACE) 2.5 MG capsule TAKE (1) CAPSULE BY MOUTH ONCE DAILY. 90 capsule 3   No current facility-administered medications on file prior to visit.   Allergies  Allergen Reactions  . Codeine     ROS: See HPI for pertinent positives and negatives.  Physical Examination  Filed Vitals:   10/16/15 0850  BP: 130/74  Pulse: 78  Height: 5\' 8"  (1.727 m)  Weight: 183 lb 11.2 oz (83.326 kg)  SpO2: 96%   Body mass index is 27.94 kg/(m^2).  General: A&O x 3, WD.  Pulmonary: Sym exp, decreased air movt in all fields,  +scattered wheezing.  Cardiac: RRR, Nl S1, S2, no detected murmur.   Carotid Bruits Right Left   Negative Negative   Aorta is mildly palpable Radial pulses are 3+ palpable and =                          VASCULAR EXAM:                                                                                                          LE Pulses Right Left       FEMORAL  2+ palpable  2+ palpable        POPLITEAL  not palpable   not palpable       POSTERIOR TIBIAL  not palpable   not palpable        DORSALIS PEDIS      ANTERIOR TIBIAL 3+ palpable  2+ palpable      Gastrointestinal: soft, NTND, -G/R, - HSM, - masses palpated, - CVAT B.  Musculoskeletal: M/S 5/5 throughout, Extremities without ischemic changes.  Neurologic: CN 2-12 intact, Pain and light touch intact in extremities are intact, Motor exam as listed above. [place].    03/05/15 CTA abd/pelvis:  the aneurysm diameter is 4.6 cm. No iliac artery aneurysm component.   Non-Invasive Vascular Imaging  AAA Duplex (10/16/2015)  Previous size: 4.9 cm (outside facility) (Date: September, 2015)  Current size:  4.45 cm (Date: 10/16/2015)  Medical Decision Making  The patient is a 65 y.o. male who presents with asymptomatic AAA with no increase in size. His primary aneurysmal growth risk factor is smoking. Fortunately his blood pressure is in good control.  The patient was counseled re smoking cessation and given several free resources re smoking cessation.    Based on this patient's exam and diagnostic studies, the patient will follow up in 6 months  with the following studies: AAA duplex.  Consideration for repair of AAA would be made when the size is 5.5 cm, growth > 1 cm/yr, and symptomatic status.  I emphasized the importance of maximal medical management including strict control of blood pressure, blood glucose, and lipid levels, antiplatelet agents, obtaining regular exercise, and cessation of smoking.   The patient was given information about AAA including signs, symptoms, treatment, and how to minimize the risk of enlargement and rupture of aneurysms.    The patient was  advised to call 911 should the patient experience sudden onset abdominal or back pain.   Thank you for allowing [place] to  participate in this patient's care.  Clemon Chambers, RN, MSN, FNP-C [facility] [place] Office: [phone]  Clinic Physician: Bridgett Larsson  10/16/2015, 8:57 AM

## 2016-04-19 ENCOUNTER — Encounter: Payer: Self-pay | Admitting: Family

## 2016-04-21 ENCOUNTER — Other Ambulatory Visit: Payer: Self-pay | Admitting: Cardiology

## 2016-04-28 ENCOUNTER — Encounter: Payer: Self-pay | Admitting: Family

## 2016-04-28 ENCOUNTER — Ambulatory Visit (INDEPENDENT_AMBULATORY_CARE_PROVIDER_SITE_OTHER): Payer: Medicare Other | Admitting: Family

## 2016-04-28 ENCOUNTER — Ambulatory Visit (HOSPITAL_COMMUNITY)
Admission: RE | Admit: 2016-04-28 | Discharge: 2016-04-28 | Disposition: A | Discharge status: Home / Self Care | Payer: Medicare Other | Source: Ambulatory Visit | Attending: Family | Admitting: Family

## 2016-04-28 VITALS — BP 120/80 | HR 61 | Ht 68.0 in | Wt 180.2 lb

## 2016-04-28 DIAGNOSIS — Z720 Tobacco use: Secondary | ICD-10-CM

## 2016-04-28 DIAGNOSIS — F17200 Nicotine dependence, unspecified, uncomplicated: Secondary | ICD-10-CM

## 2016-04-28 DIAGNOSIS — I714 Abdominal aortic aneurysm, without rupture, unspecified: Secondary | ICD-10-CM

## 2016-04-28 DIAGNOSIS — I708 Atherosclerosis of other arteries: Secondary | ICD-10-CM | POA: Diagnosis not present

## 2016-04-28 NOTE — Progress Notes (Signed)
[facility] [place]   CC: Follow up Abdominal Aortic Aneurysm  History of Present Illness  [name] is a 65 y.o. (July 30, 1951) male patient of Dr. Oneida Alar who presents for evaluation of asymptomatic abdominal aortic aneurysm. The patient has had a known aneurysm since 2013. It has slowly grown. Recent ultrasound September 2015 showed aortic diameter was 4.9 cm. Patient denies any abdominal pain. He does have chronic back pain, denies any new back pain.   The patient is a smoker. The patient denies claudication in legs with walking. The patient denies any history of stroke or TIA symptoms. He had a CABG x 7 vessels in 1995, he also has 2 cardiac stents, Dr. [name] is his cardiologist.   He reports worsening hearing.  He started noting occasional dark red blood in his stool about July 2017, is not worsening, states he has not notified his PCP re this, has an appointment with his PCP on 05/18/16. He takes Plavix, ASA, and a statin on a regular basis.  Pt Diabetic: No Pt smoker: smoker  (3/4 ppd, started at age 71 yrs)   Past Medical History:  Diagnosis Date  . Abdominal aortic aneurysm ([place])   . Adrenal adenoma   . COPD (chronic obstructive pulmonary disease) ([place])   . Coronary artery disease    Multivessel, LVEF 50%, occluded SVG to RCA, DES SVG to OM system 2007  . Essential hypertension   . Hyperlipidemia   . Noncompliance   . Syncope    Acute gastroenteritis; vasovagal  . Systolic dysfunction    EF 45-50%  . Vitamin B12 deficiency anemia   . Wolff-Parkinson-White (WPW) syndrome    Failed ablation   Past Surgical History:  Procedure Laterality Date  . BLADDER SURGERY    . CORONARY ARTERY BYPASS GRAFT  1995   LIMA to LAD , SVG to diagonal, SVG to OM1 and OM2 , SVG to RCA    Social History Social History   Social History  . Marital status: Divorced    Spouse name: N/A  . Number of children: N/A  . Years of education: N/A   Occupational  History  . Not on file.   Social History Main Topics  . Smoking status: Current Every Day Smoker    Packs/day: 1.00    Years: 40.00    Types: Cigarettes  . Smokeless tobacco: Never Used     Comment: 10 ciggarettes a a day  . Alcohol use No  . Drug use: No  . Sexual activity: No   Other Topics Concern  . Not on file   Social History Narrative  . No narrative on file   Family History Family History  Problem Relation Age of Onset  . Varicose Veins Mother   . Heart disease Father   . Heart attack Father   . Bleeding Disorder Sister   . Peripheral vascular disease Sister     amputation  . Diabetes Brother   . Varicose Veins Brother   . Diabetes Brother   . Heart disease Brother   . Varicose Veins Brother     Current Outpatient Prescriptions on File Prior to Visit  Medication Sig Dispense Refill  . aspirin 325 MG tablet Take 325 mg by mouth daily.      Marland Kitchen atorvastatin (LIPITOR) 80 MG tablet TAKE 1 TABLET BY MOUTH ONCE DAILY FOR CHOLESTEROL. 90 tablet 3  . clopidogrel (PLAVIX) 75 MG tablet Take 1 tablet (75 mg total) by mouth daily. 90 tablet  3  . fenofibrate (TRICOR) 145 MG tablet TAKE 1 TABLET BY MOUTH ONCE DAILY FOR CHOLESTEROL. 90 tablet 3  . metoprolol tartrate (LOPRESSOR) 25 MG tablet TAKE (1) TABLET BY MOUTH TWICE DAILY. 180 tablet 3  . pantoprazole (PROTONIX) 40 MG tablet TAKE 1 TABLET BY MOUTH ONCE DAILY FOR ACID REFLUX. 30 tablet 3  . ramipril (ALTACE) 2.5 MG capsule TAKE (1) CAPSULE BY MOUTH ONCE DAILY. 90 capsule 3   No current facility-administered medications on file prior to visit.    Allergies  Allergen Reactions  . Codeine     ROS: See HPI for pertinent positives and negatives.  Physical Examination  Vitals:   04/28/16 0843  BP: 120/80  Pulse: 61  SpO2: 98%  Weight: 180 lb 3.2 oz (81.7 kg)  Height: 5\' 8"  (1.727 m)   Body mass index is 27.4 kg/m.  General: A&O x 3, WD.  Pulmonary: Sym exp, decreased air movt in all fields,  + wheezing in  right posterior fields.  Cardiac: RRR, Nl S1, S2, no detected murmur.   Carotid Bruits Right Left   Negative Negative   Aorta is mildly palpable Radial pulses are 3+ palpable and =                          VASCULAR EXAM:                                                                                                                                                                                   LE Pulses Right Left       FEMORAL  1+ palpable  1+ palpable       POPLITEAL  1+ palpable  1+ palpable       POSTERIOR TIBIAL  not palpable  not palpable       DORSALIS PEDIS      ANTERIOR TIBIAL 2+ palpable 2+ palpable     Gastrointestinal: soft, NTND, -G/R, - HSM, - masses palpated, - CVAT B.  Musculoskeletal: M/S 5/5 throughout, Extremities without ischemic changes.  Neurologic: CN 2-12 intact, Pain and light touch intact in extremities are intact, Motor exam as listed above. [place].   03/05/15 CTA abd/pelvis:  the aneurysm diameter is 4.6 cm. No iliac artery aneurysm component.     Non-Invasive Vascular Imaging  AAA Duplex (04/28/2016) Decreased visualization of the abdominal vasculature due to overlying bowel gas and patient body habitus. No significant stenosis of the abdominal aorta. Intraluminal thrombus noted in the aneurysm.  >50% stenosis in bilateral common iliac arteries.    Medical Decision Making  The patient is a 65 y.o. male who presents with asymptomatic AAA  with slight increase in size, to 4.87 cm today from 4.45 cm on 10/16/15.  Significant stenosis of both iliac arteries. No claudication, femoral pulses are 1+ palpable, both DP pulses are 2+ palpable.   His primary aneurysmal growth risk factor is smoking. Fortunately his blood pressure is in good control.  I advised him to notify his PCP re the intermittent dark blood in his stool for the last couple of months.   The patient was counseled re smoking cessation and given several free resources  re smoking cessation.   Based on this patient's exam and diagnostic studies, and after discussing with Dr. Oneida Alar, the patient will follow up in 6 months with the following studies: AAA duplex.  Consideration for repair of AAA would be made when the size is 5.5 cm, growth > 1 cm/yr, and symptomatic status.  I emphasized the importance of maximal medical management including strict control of blood pressure, blood glucose, and lipid levels, antiplatelet agents, obtaining regular exercise, and  cessation of smoking.   The patient was given information about AAA including signs, symptoms, treatment, and how to minimize the risk of enlargement and rupture of aneurysms.    The patient was advised to call 911 should the patient experience sudden onset abdominal or back pain.   Thank you for allowing [place] to participate in this patient's care.  Clemon Chambers, RN, MSN, FNP-C [facility] [place] Office: [facility] Physician: Oneida Alar  04/28/2016, 9:06 AM

## 2016-04-28 NOTE — Patient Instructions (Signed)
Abdominal Aortic Aneurysm An aneurysm is a weakened or damaged part of an artery wall that bulges from the normal force of blood pumping through the body. An abdominal aortic aneurysm is an aneurysm that occurs in the lower part of the aorta, the main artery of the body.  The major concern with an abdominal aortic aneurysm is that it can enlarge and burst (rupture) or blood can flow between the layers of the wall of the aorta through a tear (aorticdissection). Both of these conditions can cause bleeding inside the body and can be life threatening unless diagnosed and treated promptly. CAUSES  The exact cause of an abdominal aortic aneurysm is unknown. Some contributing factors are:   A hardening of the arteries caused by the buildup of fat and other substances in the lining of a blood vessel (arteriosclerosis).  Inflammation of the walls of an artery (arteritis).   Connective tissue diseases, such as Marfan syndrome.   Abdominal trauma.   An infection, such as syphilis or staphylococcus, in the wall of the aorta (infectious aortitis) caused by bacteria. RISK FACTORS  Risk factors that contribute to an abdominal aortic aneurysm may include:  Age older than 60 years.   High blood pressure (hypertension).  Male gender.  Ethnicity (white race).  Obesity.  Family history of aneurysm (first degree relatives only).  Tobacco use. PREVENTION  The following healthy lifestyle habits may help decrease your risk of abdominal aortic aneurysm:  Quitting smoking. Smoking can raise your blood pressure and cause arteriosclerosis.  Limiting or avoiding alcohol.  Keeping your blood pressure, blood sugar level, and cholesterol levels within normal limits.  Decreasing your salt intake. In somepeople, too much salt can raise blood pressure and increase your risk of abdominal aortic aneurysm.  Eating a diet low in saturated fats and cholesterol.  Increasing your fiber intake by including  whole grains, vegetables, and fruits in your diet. Eating these foods may help lower blood pressure.  Maintaining a healthy weight.  Staying physically active and exercising regularly. SYMPTOMS  The symptoms of abdominal aortic aneurysm may vary depending on the size and rate of growth of the aneurysm.Most grow slowly and do not have any symptoms. When symptoms do occur, they may include:  Pain (abdomen, side, lower back, or groin). The pain may vary in intensity. A sudden onset of severe pain may indicate that the aneurysm has ruptured.  Feeling full after eating only small amounts of food.  Nausea or vomiting or both.  Feeling a pulsating lump in the abdomen.  Feeling faint or passing out. DIAGNOSIS  Since most unruptured abdominal aortic aneurysms have no symptoms, they are often discovered during diagnostic exams for other conditions. An aneurysm may be found during the following procedures:  Ultrasonography (A one-time screening for abdominal aortic aneurysm by ultrasonography is also recommended for all men aged 65-75 years who have ever smoked).  X-ray exams.  A computed tomography (CT).  Magnetic resonance imaging (MRI).  Angiography or arteriography. TREATMENT  Treatment of an abdominal aortic aneurysm depends on the size of your aneurysm, your age, and risk factors for rupture. Medication to control blood pressure and pain may be used to manage aneurysms smaller than 6 cm. Regular monitoring for enlargement may be recommended by your caregiver if:  The aneurysm is 3-4 cm in size (an annual ultrasonography may be recommended).  The aneurysm is 4-4.5 cm in size (an ultrasonography every 6 months may be recommended).  The aneurysm is larger than 4.5 cm in   size (your caregiver may ask that you be examined by a vascular surgeon). If your aneurysm is larger than 6 cm, surgical repair may be recommended. There are two main methods for repair of an aneurysm:   Endovascular  repair (a minimally invasive surgery). This is done most often.  Open repair. This method is used if an endovascular repair is not possible.   This information is not intended to replace advice given to you by your health care provider. Make sure you discuss any questions you have with your health care provider.   Document Released: 05/18/2005 Document Revised: 12/03/2012 Document Reviewed: 09/07/2012 Elsevier Interactive Patient Education 2016 Elsevier Inc.     Steps to Quit Smoking  Smoking tobacco can be harmful to your health and can affect almost every organ in your body. Smoking puts you, and those around you, at risk for developing many serious chronic diseases. Quitting smoking is difficult, but it is one of the best things that you can do for your health. It is never too late to quit. WHAT ARE THE BENEFITS OF QUITTING SMOKING? When you quit smoking, you lower your risk of developing serious diseases and conditions, such as:  Lung cancer or lung disease, such as COPD.  Heart disease.  Stroke.  Heart attack.  Infertility.  Osteoporosis and bone fractures. Additionally, symptoms such as coughing, wheezing, and shortness of breath may get better when you quit. You may also find that you get sick less often because your body is stronger at fighting off colds and infections. If you are pregnant, quitting smoking can help to reduce your chances of having a baby of low birth weight. HOW DO I GET READY TO QUIT? When you decide to quit smoking, create a plan to make sure that you are successful. Before you quit:  Pick a date to quit. Set a date within the next two weeks to give you time to prepare.  Write down the reasons why you are quitting. Keep this list in places where you will see it often, such as on your bathroom mirror or in your car or wallet.  Identify the people, places, things, and activities that make you want to smoke (triggers) and avoid them. Make sure to take  these actions:  Throw away all cigarettes at home, at work, and in your car.  Throw away smoking accessories, such as ashtrays and lighters.  Clean your car and make sure to empty the ashtray.  Clean your home, including curtains and carpets.  Tell your family, friends, and coworkers that you are quitting. Support from your loved ones can make quitting easier.  Talk with your health care provider about your options for quitting smoking.  Find out what treatment options are covered by your health insurance. WHAT STRATEGIES CAN I USE TO QUIT SMOKING?  Talk with your healthcare provider about different strategies to quit smoking. Some strategies include:  Quitting smoking altogether instead of gradually lessening how much you smoke over a period of time. Research shows that quitting "cold turkey" is more successful than gradually quitting.  Attending in-person counseling to help you build problem-solving skills. You are more likely to have success in quitting if you attend several counseling sessions. Even short sessions of 10 minutes can be effective.  Finding resources and support systems that can help you to quit smoking and remain smoke-free after you quit. These resources are most helpful when you use them often. They can include:  Online chats with a counselor.  Telephone   quitlines.  Printed self-help materials.  Support groups or group counseling.  Text messaging programs.  Mobile phone applications.  Taking medicines to help you quit smoking. (If you are pregnant or breastfeeding, talk with your health care provider first.) Some medicines contain nicotine and some do not. Both types of medicines help with cravings, but the medicines that include nicotine help to relieve withdrawal symptoms. Your health care provider may recommend:  Nicotine patches, gum, or lozenges.  Nicotine inhalers or sprays.  Non-nicotine medicine that is taken by mouth. Talk with your health care  provider about combining strategies, such as taking medicines while you are also receiving in-person counseling. Using these two strategies together makes you more likely to succeed in quitting than if you used either strategy on its own. If you are pregnant or breastfeeding, talk with your health care provider about finding counseling or other support strategies to quit smoking. Do not take medicine to help you quit smoking unless told to do so by your health care provider. WHAT THINGS CAN I DO TO MAKE IT EASIER TO QUIT? Quitting smoking might feel overwhelming at first, but there is a lot that you can do to make it easier. Take these important actions:  Reach out to your family and friends and ask that they support and encourage you during this time. Call telephone quitlines, reach out to support groups, or work with a counselor for support.  Ask people who smoke to avoid smoking around you.  Avoid places that trigger you to smoke, such as bars, parties, or smoke-break areas at work.  Spend time around people who do not smoke.  Lessen stress in your life, because stress can be a smoking trigger for some people. To lessen stress, try:  Exercising regularly.  Deep-breathing exercises.  Yoga.  Meditating.  Performing a body scan. This involves closing your eyes, scanning your body from head to toe, and noticing which parts of your body are particularly tense. Purposefully relax the muscles in those areas.  Download or purchase mobile phone or tablet apps (applications) that can help you stick to your quit plan by providing reminders, tips, and encouragement. There are many free apps, such as QuitGuide from the CDC (Centers for Disease Control and Prevention). You can find other support for quitting smoking (smoking cessation) through smokefree.gov and other websites. HOW WILL I FEEL WHEN I QUIT SMOKING? Within the first 24 hours of quitting smoking, you may start to feel some withdrawal  symptoms. These symptoms are usually most noticeable 2-3 days after quitting, but they usually do not last beyond 2-3 weeks. Changes or symptoms that you might experience include:  Mood swings.  Restlessness, anxiety, or irritation.  Difficulty concentrating.  Dizziness.  Strong cravings for sugary foods in addition to nicotine.  Mild weight gain.  Constipation.  Nausea.  Coughing or a sore throat.  Changes in how your medicines work in your body.  A depressed mood.  Difficulty sleeping (insomnia). After the first 2-3 weeks of quitting, you may start to notice more positive results, such as:  Improved sense of smell and taste.  Decreased coughing and sore throat.  Slower heart rate.  Lower blood pressure.  Clearer skin.  The ability to breathe more easily.  Fewer sick days. Quitting smoking is very challenging for most people. Do not get discouraged if you are not successful the first time. Some people need to make many attempts to quit before they achieve long-term success. Do your best to stick to   your quit plan, and talk with your health care provider if you have any questions or concerns.   This information is not intended to replace advice given to you by your health care provider. Make sure you discuss any questions you have with your health care provider.   Document Released: 08/02/2001 Document Revised: 12/23/2014 Document Reviewed: 12/23/2014 Elsevier Interactive Patient Education 2016 Elsevier Inc.  

## 2016-06-02 DIAGNOSIS — Z1389 Encounter for screening for other disorder: Secondary | ICD-10-CM | POA: Diagnosis not present

## 2016-06-02 DIAGNOSIS — I2510 Atherosclerotic heart disease of native coronary artery without angina pectoris: Secondary | ICD-10-CM | POA: Diagnosis not present

## 2016-06-02 DIAGNOSIS — J449 Chronic obstructive pulmonary disease, unspecified: Secondary | ICD-10-CM | POA: Diagnosis not present

## 2016-06-02 DIAGNOSIS — Z0001 Encounter for general adult medical examination with abnormal findings: Secondary | ICD-10-CM | POA: Diagnosis not present

## 2016-06-02 DIAGNOSIS — R062 Wheezing: Secondary | ICD-10-CM | POA: Diagnosis not present

## 2016-06-02 DIAGNOSIS — R7309 Other abnormal glucose: Secondary | ICD-10-CM | POA: Diagnosis not present

## 2016-06-02 DIAGNOSIS — I10 Essential (primary) hypertension: Secondary | ICD-10-CM | POA: Diagnosis not present

## 2016-06-02 DIAGNOSIS — E782 Mixed hyperlipidemia: Secondary | ICD-10-CM | POA: Diagnosis not present

## 2016-06-03 ENCOUNTER — Encounter: Payer: Self-pay | Admitting: Cardiology

## 2016-06-03 ENCOUNTER — Ambulatory Visit (HOSPITAL_COMMUNITY)
Admission: RE | Admit: 2016-06-03 | Discharge: 2016-06-03 | Disposition: A | Discharge status: Home / Self Care | Payer: Medicare Other | Source: Ambulatory Visit | Attending: Family Medicine | Admitting: Family Medicine

## 2016-06-03 ENCOUNTER — Other Ambulatory Visit (HOSPITAL_COMMUNITY): Payer: Self-pay | Admitting: Family Medicine

## 2016-06-03 ENCOUNTER — Ambulatory Visit (INDEPENDENT_AMBULATORY_CARE_PROVIDER_SITE_OTHER): Payer: Medicare Other | Admitting: Cardiology

## 2016-06-03 VITALS — BP 108/64 | HR 69 | Ht 68.0 in | Wt 178.0 lb

## 2016-06-03 DIAGNOSIS — I714 Abdominal aortic aneurysm, without rupture, unspecified: Secondary | ICD-10-CM

## 2016-06-03 DIAGNOSIS — I2510 Atherosclerotic heart disease of native coronary artery without angina pectoris: Secondary | ICD-10-CM | POA: Insufficient documentation

## 2016-06-03 DIAGNOSIS — R0602 Shortness of breath: Secondary | ICD-10-CM | POA: Diagnosis not present

## 2016-06-03 DIAGNOSIS — E782 Mixed hyperlipidemia: Secondary | ICD-10-CM

## 2016-06-03 DIAGNOSIS — I255 Ischemic cardiomyopathy: Secondary | ICD-10-CM | POA: Diagnosis not present

## 2016-06-03 DIAGNOSIS — I700 Atherosclerosis of aorta: Secondary | ICD-10-CM | POA: Diagnosis not present

## 2016-06-03 DIAGNOSIS — J449 Chronic obstructive pulmonary disease, unspecified: Secondary | ICD-10-CM | POA: Diagnosis not present

## 2016-06-03 DIAGNOSIS — R05 Cough: Secondary | ICD-10-CM | POA: Diagnosis not present

## 2016-06-03 DIAGNOSIS — I10 Essential (primary) hypertension: Secondary | ICD-10-CM

## 2016-06-03 NOTE — Patient Instructions (Addendum)
Your physician wants you to follow-up in: 1 year Dr McDowell You will receive a reminder letter in the mail two months in advance. If you don't receive a letter, please call our office to schedule the follow-up appointment.    Your physician recommends that you continue on your current medications as directed. Please refer to the Current Medication list given to you today.    Thank you for choosing [facility] !         

## 2016-06-03 NOTE — Progress Notes (Signed)
Cardiology Office Note  Date: 06/03/2016   ID: MUCAAD SCHIERMAN, DOB May 21, 1951, MRN [id number]  PCP: [name], MD  Primary Cardiologist: Rozann Lesches, MD   Chief Complaint  Patient presents with  . Coronary Artery Disease    History of Present Illness: [name] is a 65 y.o. male last seen in November 2016. He presents for a routine follow-up visit, does not report any angina symptoms. We have been managing him medically with history of previous CABG and known graft disease.  He continues to follow with [facility] for asymptomatic AAA, recent abdominal ultrasound noted below.  We went over his medications. He continues on aspirin, Lipitor, Plavix, Lopressor, Altace, and TriCor. I am requesting his most recent lab work from Dr. Hilma Favors.  Past Medical History:  Diagnosis Date  . Abdominal aortic aneurysm ([place])   . Adrenal adenoma   . COPD (chronic obstructive pulmonary disease) ([place])   . Coronary artery disease    Multivessel, LVEF 50%, occluded SVG to RCA, DES SVG to OM system 2007  . Essential hypertension   . Hyperlipidemia   . Noncompliance   . Syncope    Acute gastroenteritis; vasovagal  . Systolic dysfunction    EF 45-50%  . Vitamin B12 deficiency anemia   . Wolff-Parkinson-White (WPW) syndrome    Failed ablation    Past Surgical History:  Procedure Laterality Date  . BLADDER SURGERY    . CORONARY ARTERY BYPASS GRAFT  1995   LIMA to LAD , SVG to diagonal, SVG to OM1 and OM2 , SVG to RCA     Current Outpatient Prescriptions  Medication Sig Dispense Refill  . aspirin 325 MG tablet Take 325 mg by mouth daily.      Marland Kitchen atorvastatin (LIPITOR) 80 MG tablet TAKE 1 TABLET BY MOUTH ONCE DAILY FOR CHOLESTEROL. 90 tablet 3  . clopidogrel (PLAVIX) 75 MG tablet Take 1 tablet (75 mg total) by mouth daily. 90 tablet 3  . fenofibrate (TRICOR) 145 MG tablet TAKE 1 TABLET BY MOUTH ONCE DAILY FOR CHOLESTEROL. 90 tablet 3  . metoprolol tartrate (LOPRESSOR) 25 MG  tablet TAKE (1) TABLET BY MOUTH TWICE DAILY. 180 tablet 3  . pantoprazole (PROTONIX) 40 MG tablet TAKE 1 TABLET BY MOUTH ONCE DAILY FOR ACID REFLUX. 30 tablet 3  . ramipril (ALTACE) 2.5 MG capsule TAKE (1) CAPSULE BY MOUTH ONCE DAILY. 90 capsule 3   No current facility-administered medications for this visit.    Allergies:  Codeine   Social History: The patient  reports that he has been smoking Cigarettes.  He started smoking about 47 years ago. He has a 40.00 pack-year smoking history. He has never used smokeless tobacco. He reports that he does not drink alcohol or use drugs.   ROS:  Please see the history of present illness. Otherwise, complete review of systems is positive for decreased hearing left ear.  All other systems are reviewed and negative.   Physical Exam: VS:  BP 108/64   Pulse 69   Ht 5\' 8"  (1.727 m)   Wt 178 lb (80.7 kg)   SpO2 96%   BMI 27.06 kg/m , BMI Body mass index is 27.06 kg/m.  Wt Readings from Last 3 Encounters:  06/03/16 178 lb (80.7 kg)  04/28/16 180 lb 3.2 oz (81.7 kg)  10/16/15 183 lb 11.2 oz (83.3 kg)    General: Patient appears comfortable at rest. HEENT: Conjunctiva and lids normal, oropharynx clear with moist mucosa. Neck: Supple, no elevated  JVP or carotid bruits, no thyromegaly. Lungs: Clear to auscultation, nonlabored breathing at rest. Cardiac: Regular rate and rhythm, no S3 or significant systolic murmur, no pericardial rub. Abdomen: Soft, nontender, no hepatomegaly, bowel sounds present, no guarding or rebound. Extremities: No pitting edema, distal pulses 2+.  ECG: I personally reviewed the tracing from 07/06/2015 which showed sinus rhythm with low voltage in the limb leads, nonspecific ST-T changes.  Other Studies Reviewed Today:  Abdominal ultrasound 04/28/2016: Aneurysmal dilatation of the distal abdominal aorta measuring 4.73 x 4.87 cm. Intraluminal thrombus noted.  Echocardiogram 06/28/2012: Study Conclusions   - Left ventricle:  Mid inferior and inferobasal wall hypokinesis The cavity size was normal. Wall thickness was increased in a pattern of mild LVH. Systolic function was mildly reduced. The estimated ejection fraction was in the range of 45% to 50%. - Mitral valve: Mild regurgitation. - Left atrium: The atrium was mildly dilated. - Atrial septum: No defect or patent foramen ovale was identified. - Pericardium, extracardiac: A trivial pericardial effusion was identified posterior to the heart. - Impressions: EF stable and consistant with old myovue showed EF in 45% range and inferior scar Impressions:  - EF stable and consistant with old myovue showed EF in 45% range and inferior scar  Assessment and Plan:  1. Multivessel CAD status post CABG with graft disease and prior DES to the SVG to OM in 2007. He has generally preferred conservative management without ischemic testing, remains stable with no angina symptoms on medical therapy. We will continue with observation.  2. Cardiomyopathy with LVEF 45-50%. Reports stable NYHA class II dyspnea, no significant orthopnea or increase in weight.  3. Hyperlipidemia, continues on Lipitor. Requesting most recent lab work from Dr. Hilma Favors.  4. Essential hypertension, blood pressure is well controlled today.  5. Asymptomatic abdominal aortic aneurysm as outlined above. Keep follow-up with [facility]  Current medicines were reviewed with the patient today.  Disposition: Follow-up with me in one year.  Signed, Satira Sark, MD, [facility] 06/03/2016 10:46 AM    [facility]. [address], [place], [place] [address] Phone: [phone]; Fax: [phone]

## 2016-06-23 ENCOUNTER — Other Ambulatory Visit: Payer: Self-pay | Admitting: Cardiology

## 2016-07-25 NOTE — Addendum Note (Signed)
Addended by: Lianne Cure A on: 07/25/2016 02:01 PM   Modules accepted: Orders

## 2016-07-31 ENCOUNTER — Emergency Department (HOSPITAL_COMMUNITY): Payer: Medicare Other

## 2016-07-31 ENCOUNTER — Inpatient Hospital Stay (HOSPITAL_COMMUNITY)
Admission: EM | Admit: 2016-07-31 | Discharge: 2016-08-02 | DRG: 309 | Disposition: A | Discharge status: Home / Self Care | Payer: Medicare Other | Attending: Family Medicine | Admitting: Family Medicine

## 2016-07-31 ENCOUNTER — Encounter (HOSPITAL_COMMUNITY): Payer: Self-pay | Admitting: Emergency Medicine

## 2016-07-31 DIAGNOSIS — I25810 Atherosclerosis of coronary artery bypass graft(s) without angina pectoris: Secondary | ICD-10-CM | POA: Diagnosis present

## 2016-07-31 DIAGNOSIS — I714 Abdominal aortic aneurysm, without rupture, unspecified: Secondary | ICD-10-CM | POA: Diagnosis present

## 2016-07-31 DIAGNOSIS — Z7982 Long term (current) use of aspirin: Secondary | ICD-10-CM

## 2016-07-31 DIAGNOSIS — R079 Chest pain, unspecified: Secondary | ICD-10-CM

## 2016-07-31 DIAGNOSIS — Z951 Presence of aortocoronary bypass graft: Secondary | ICD-10-CM

## 2016-07-31 DIAGNOSIS — Z833 Family history of diabetes mellitus: Secondary | ICD-10-CM

## 2016-07-31 DIAGNOSIS — I2510 Atherosclerotic heart disease of native coronary artery without angina pectoris: Secondary | ICD-10-CM | POA: Diagnosis present

## 2016-07-31 DIAGNOSIS — Z955 Presence of coronary angioplasty implant and graft: Secondary | ICD-10-CM | POA: Diagnosis not present

## 2016-07-31 DIAGNOSIS — I456 Pre-excitation syndrome: Secondary | ICD-10-CM | POA: Diagnosis not present

## 2016-07-31 DIAGNOSIS — F17200 Nicotine dependence, unspecified, uncomplicated: Secondary | ICD-10-CM | POA: Diagnosis present

## 2016-07-31 DIAGNOSIS — M546 Pain in thoracic spine: Secondary | ICD-10-CM | POA: Diagnosis not present

## 2016-07-31 DIAGNOSIS — K219 Gastro-esophageal reflux disease without esophagitis: Secondary | ICD-10-CM | POA: Diagnosis not present

## 2016-07-31 DIAGNOSIS — R000 Tachycardia, unspecified: Secondary | ICD-10-CM

## 2016-07-31 DIAGNOSIS — R0600 Dyspnea, unspecified: Secondary | ICD-10-CM | POA: Diagnosis present

## 2016-07-31 DIAGNOSIS — J449 Chronic obstructive pulmonary disease, unspecified: Secondary | ICD-10-CM | POA: Diagnosis present

## 2016-07-31 DIAGNOSIS — I10 Essential (primary) hypertension: Secondary | ICD-10-CM

## 2016-07-31 DIAGNOSIS — I4891 Unspecified atrial fibrillation: Principal | ICD-10-CM | POA: Diagnosis present

## 2016-07-31 DIAGNOSIS — I4892 Unspecified atrial flutter: Secondary | ICD-10-CM | POA: Diagnosis not present

## 2016-07-31 DIAGNOSIS — Z8679 Personal history of other diseases of the circulatory system: Secondary | ICD-10-CM

## 2016-07-31 DIAGNOSIS — E785 Hyperlipidemia, unspecified: Secondary | ICD-10-CM | POA: Diagnosis not present

## 2016-07-31 DIAGNOSIS — Z7902 Long term (current) use of antithrombotics/antiplatelets: Secondary | ICD-10-CM

## 2016-07-31 DIAGNOSIS — Z8249 Family history of ischemic heart disease and other diseases of the circulatory system: Secondary | ICD-10-CM | POA: Diagnosis not present

## 2016-07-31 DIAGNOSIS — Z79899 Other long term (current) drug therapy: Secondary | ICD-10-CM | POA: Diagnosis not present

## 2016-07-31 DIAGNOSIS — Z885 Allergy status to narcotic agent status: Secondary | ICD-10-CM

## 2016-07-31 DIAGNOSIS — R748 Abnormal levels of other serum enzymes: Secondary | ICD-10-CM | POA: Diagnosis not present

## 2016-07-31 DIAGNOSIS — R0789 Other chest pain: Secondary | ICD-10-CM | POA: Diagnosis not present

## 2016-07-31 DIAGNOSIS — F17210 Nicotine dependence, cigarettes, uncomplicated: Secondary | ICD-10-CM | POA: Diagnosis not present

## 2016-07-31 DIAGNOSIS — I471 Supraventricular tachycardia: Secondary | ICD-10-CM | POA: Diagnosis not present

## 2016-07-31 LAB — HEPATIC FUNCTION PANEL
ALT: 30 U/L (ref 17–63)
AST: 30 U/L (ref 15–41)
Albumin: 4.2 g/dL (ref 3.5–5.0)
Alkaline Phosphatase: 85 U/L (ref 38–126)
BILIRUBIN DIRECT: 0.2 mg/dL (ref 0.1–0.5)
BILIRUBIN TOTAL: 1.2 mg/dL (ref 0.3–1.2)
Indirect Bilirubin: 1 mg/dL — ABNORMAL HIGH (ref 0.3–0.9)
Total Protein: 6.8 g/dL (ref 6.5–8.1)

## 2016-07-31 LAB — CBC
HCT: 48.6 % (ref 39.0–52.0)
HEMATOCRIT: 44.4 % (ref 39.0–52.0)
HEMOGLOBIN: 14.5 g/dL (ref 13.0–17.0)
Hemoglobin: 16 g/dL (ref 13.0–17.0)
MCH: 30.8 pg (ref 26.0–34.0)
MCH: 30.7 pg (ref 26.0–34.0)
MCHC: 32.9 g/dL (ref 30.0–36.0)
MCHC: 32.7 g/dL (ref 30.0–36.0)
MCV: 93.6 fL (ref 78.0–100.0)
MCV: 93.9 fL (ref 78.0–100.0)
PLATELETS: 207 10*3/uL (ref 150–400)
Platelets: 184 10*3/uL (ref 150–400)
RBC: 5.19 MIL/uL (ref 4.22–5.81)
RBC: 4.73 MIL/uL (ref 4.22–5.81)
RDW: 12.8 % (ref 11.5–15.5)
RDW: 12.7 % (ref 11.5–15.5)
WBC: 7.6 10*3/uL (ref 4.0–10.5)
WBC: 7.3 10*3/uL (ref 4.0–10.5)

## 2016-07-31 LAB — I-STAT TROPONIN, ED: Troponin i, poc: 0.03 ng/mL (ref 0.00–0.08)

## 2016-07-31 LAB — T4, FREE: FREE T4: 0.89 ng/dL (ref 0.61–1.12)

## 2016-07-31 LAB — BASIC METABOLIC PANEL
Anion gap: 8 (ref 5–15)
BUN: 10 mg/dL (ref 6–20)
CHLORIDE: 102 mmol/L (ref 101–111)
CO2: 27 mmol/L (ref 22–32)
CREATININE: 1.5 mg/dL — AB (ref 0.61–1.24)
Calcium: 10.2 mg/dL (ref 8.9–10.3)
GFR calc Af Amer: 55 mL/min — ABNORMAL LOW (ref >60)
GFR calc non Af Amer: 48 mL/min — ABNORMAL LOW (ref >60)
Glucose, Bld: 133 mg/dL — ABNORMAL HIGH (ref 65–99)
Potassium: 4 mmol/L (ref 3.5–5.1)
Sodium: 137 mmol/L (ref 135–145)

## 2016-07-31 LAB — TROPONIN I: Troponin I: 0.04 ng/mL (ref <0.03)

## 2016-07-31 LAB — TSH: TSH: 1.243 u[IU]/mL (ref 0.350–4.500)

## 2016-07-31 MED ORDER — SODIUM CHLORIDE 0.9 % IV BOLUS (SEPSIS)
500.0000 mL | Freq: Once | INTRAVENOUS | Status: AC
  Administered 2016-07-31: 500 mL via INTRAVENOUS

## 2016-07-31 MED ORDER — POLYETHYLENE GLYCOL 3350 17 G PO PACK: 17.0000 g | PACK | Freq: Every day | ORAL | Status: DC | PRN

## 2016-07-31 MED ORDER — CLOPIDOGREL BISULFATE 75 MG PO TABS
75.0000 mg | ORAL_TABLET | Freq: Every day | ORAL | Status: DC
  Administered 2016-08-01 – 2016-08-02 (×2): 75 mg via ORAL
  Filled 2016-07-31 (×2): qty 1

## 2016-07-31 MED ORDER — METOPROLOL TARTRATE 25 MG PO TABS
25.0000 mg | ORAL_TABLET | Freq: Two times a day (BID) | ORAL | Status: DC
  Administered 2016-07-31 – 2016-08-01 (×2): 25 mg via ORAL
  Filled 2016-07-31 (×2): qty 1

## 2016-07-31 MED ORDER — SODIUM CHLORIDE 0.9% FLUSH
3.0000 mL | Freq: Two times a day (BID) | INTRAVENOUS | Status: DC
  Administered 2016-07-31 – 2016-08-01 (×3): 3 mL via INTRAVENOUS

## 2016-07-31 MED ORDER — DEXTROSE-NACL 5-0.45 % IV SOLN
INTRAVENOUS | Status: DC
  Administered 2016-07-31: 75 mL/h via INTRAVENOUS
  Administered 2016-08-01: 14:00:00 via INTRAVENOUS

## 2016-07-31 MED ORDER — ONDANSETRON HCL 4 MG/2ML IJ SOLN: 4.0000 mg | Freq: Four times a day (QID) | INTRAMUSCULAR | Status: DC | PRN

## 2016-07-31 MED ORDER — RAMIPRIL 1.25 MG PO CAPS
2.5000 mg | ORAL_CAPSULE | Freq: Every day | ORAL | Status: DC
  Administered 2016-08-01 – 2016-08-02 (×3): 2.5 mg via ORAL
  Filled 2016-07-31 (×3): qty 2

## 2016-07-31 MED ORDER — FENOFIBRATE 160 MG PO TABS
160.0000 mg | ORAL_TABLET | Freq: Every day | ORAL | Status: DC
Start: 2016-08-01 — End: 2016-08-02
  Administered 2016-08-01 – 2016-08-02 (×2): 160 mg via ORAL
  Filled 2016-07-31 (×2): qty 1

## 2016-07-31 MED ORDER — ONDANSETRON HCL 4 MG PO TABS: 4.0000 mg | ORAL_TABLET | Freq: Four times a day (QID) | ORAL | Status: DC | PRN

## 2016-07-31 MED ORDER — DEXTROSE 5 % IV SOLN
5.0000 mg/h | Freq: Once | INTRAVENOUS | Status: AC
  Administered 2016-07-31: 5 mg/h via INTRAVENOUS
  Filled 2016-07-31: qty 100

## 2016-07-31 MED ORDER — ACETAMINOPHEN 325 MG PO TABS: 650.0000 mg | ORAL_TABLET | Freq: Four times a day (QID) | ORAL | Status: DC | PRN

## 2016-07-31 MED ORDER — ACETAMINOPHEN 650 MG RE SUPP: 650.0000 mg | Freq: Four times a day (QID) | RECTAL | Status: DC | PRN

## 2016-07-31 MED ORDER — ATORVASTATIN CALCIUM 40 MG PO TABS
80.0000 mg | ORAL_TABLET | Freq: Every day | ORAL | Status: DC
  Administered 2016-08-01: 80 mg via ORAL
  Filled 2016-07-31: qty 2

## 2016-07-31 MED ORDER — IOPAMIDOL (ISOVUE-370) INJECTION 76%
100.0000 mL | Freq: Once | INTRAVENOUS | Status: AC | PRN
  Administered 2016-07-31: 100 mL via INTRAVENOUS

## 2016-07-31 MED ORDER — ENOXAPARIN SODIUM 40 MG/0.4ML ~~LOC~~ SOLN: 40.0000 mg | SUBCUTANEOUS | Status: DC

## 2016-07-31 NOTE — ED Notes (Signed)
CRITICAL VALUE ALERT  Critical value received:  Troponin 0.04  Date of notification:  07/31/2016  Time of notification:  [id number]  Critical value read back:  Yes  Nurse who received alert:  Cena Benton   MD notified (1st page):   Dr. Lita Mains  Time of first page:  [phone]

## 2016-07-31 NOTE — H&P (Signed)
[facility] History and Physical  [name] [id number] DOB: [birth year] DOA: 07/31/2016  Referring physician: Dr Lita Mains PCP: [name], MD   Chief Complaint: Gen'd weakness, CP/ SOB  HPI: [name] is a 65 y.o. male with history of CAD sp CABG 1997 and hx of Wolff-Parkinson-White syndrome, f/b cardiology (Dr [name]), also hx of COPD, tobacco use, HTN.  Patient presented to ED with c/o of 2-3 days hx of DOE, gen'd weakness and intermittent chest tightness/ "quivering" since shoveling snow on Friday two days ago.  In ED found to have afib/ flutter with VR 140's maximum.  Was given IV diltiazem and HR improved down to 100- 110 range and pt is feeling better.  No further chest symptoms or SOB.  CTA of chest and abd (done for hx of AAA) showed slight increase in AAA size but no evidence of dissection. CXR showed no edema, no acitve disease. Asked to see for admission.    Chest symptoms have been "fluttering" and "quivering", not chest pain per se.  No orthopnea or severe dyspnea at rest, just DOE.  NO abd pain n /v/d.    Pt is single, lives w only child his grown daughter.  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  Had CABG x 7 in 1997.  Had attempt per patient to "fix the WPW" but it didn't work.  Remains active, is driving, does chores at home, cooks etc.    F/B Dr Oneida Alar with [facility] for AAA.  F/B Dr [name] for cardiology.  +smoker active, no etoh.      ROS  denies CP  no joint pain   no HA  no blurry vision  no rash  no diarrhea  no nausea/ vomiting  no dysuria  no difficulty voiding  no change in urine color    Past Medical History  Past Medical History:  Diagnosis Date  . Abdominal aortic aneurysm ([place])   . Adrenal adenoma   . COPD (chronic obstructive pulmonary disease) ([place])   . Coronary artery disease    Multivessel, LVEF 50%, occluded SVG to RCA, DES SVG to OM  system 2007  . Essential hypertension   . Hyperlipidemia   . Noncompliance   . Syncope    Acute gastroenteritis; vasovagal  . Systolic dysfunction    EF 45-50%  . Vitamin B12 deficiency anemia   . Wolff-Parkinson-White (WPW) syndrome    Failed ablation   Past Surgical History  Past Surgical History:  Procedure Laterality Date  . BLADDER SURGERY    . CORONARY ANGIOPLASTY WITH STENT PLACEMENT    . CORONARY ARTERY BYPASS GRAFT  1995   LIMA to LAD , SVG to diagonal, SVG to OM1 and OM2 , SVG to RCA    Family History  Family History  Problem Relation Age of Onset  . Varicose Veins Mother   . Heart disease Father   . Heart attack Father   . Bleeding Disorder Sister   . Peripheral vascular disease Sister     amputation  . Diabetes Brother   . Varicose Veins Brother   . Diabetes Brother   . Heart disease Brother   . Varicose Veins Brother    Social History  reports that he has been smoking Cigarettes.  He started smoking about 47 years ago. He has a 40.00 pack-year smoking history. He has never used smokeless tobacco. He reports that he  does not drink alcohol or use drugs. Allergies  Allergies  Allergen Reactions  . Codeine    Home medications Prior to Admission medications   Medication Sig Start Date End Date Taking? Authorizing Provider  aspirin 325 MG tablet Take 325 mg by mouth daily.     Yes Historical Provider, MD  atorvastatin (LIPITOR) 80 MG tablet TAKE 1 TABLET BY MOUTH ONCE DAILY FOR CHOLESTEROL. 06/23/16  Yes Satira Sark, MD  clopidogrel (PLAVIX) 75 MG tablet TAKE ONE TABLET BY MOUTH ONCE DAILY. 06/23/16  Yes Satira Sark, MD  fenofibrate (TRICOR) 145 MG tablet TAKE 1 TABLET BY MOUTH ONCE DAILY FOR CHOLESTEROL. 06/23/16  Yes Satira Sark, MD  metoprolol tartrate (LOPRESSOR) 25 MG tablet TAKE (1) TABLET BY MOUTH TWICE DAILY. 06/23/16  Yes Satira Sark, MD  pantoprazole (PROTONIX) 40 MG tablet TAKE 1 TABLET BY MOUTH ONCE DAILY FOR ACID REFLUX. 04/21/16   Yes Satira Sark, MD  ramipril (ALTACE) 2.5 MG capsule TAKE (1) CAPSULE BY MOUTH ONCE DAILY. 06/23/16  Yes Satira Sark, MD   Liver Function Tests  Recent Labs Lab 07/31/16 1249  AST 30  ALT 30  ALKPHOS 85  BILITOT 1.2  PROT 6.8  ALBUMIN 4.2   No results for input(s): LIPASE, AMYLASE in the last 168 hours. CBC  Recent Labs Lab 07/31/16 1238  WBC 7.6  HGB 16.0  HCT 48.6  MCV 93.6  PLT [id number]   Basic Metabolic Panel  Recent Labs Lab 07/31/16 1238  NA 137  K 4.0  CL 102  CO2 27  GLUCOSE 133*  BUN 10  CREATININE 1.50*  CALCIUM 10.2     Vitals:   07/31/16 1630 07/31/16 1700 07/31/16 1730 07/31/16 1820  BP: 114/86 108/69 122/74 129/69  Pulse: 90 91 94 91  Resp: 20 18 19 20   Temp:    98 F (36.7 C)  TempSrc:    Oral  SpO2: 94% 95% 98% 98%  Weight:    80.7 kg (177 lb 14.6 oz)  Height:    5\' 7"  (1.702 m)   Exam: Gen alert, no distress, calm, WDWN No rash, cyanosis or gangrene Sclera anicteric, throat clear  No jvd or bruits Chest clear bilat Cor irreg irreg w 2/6 SEM, no RG, sternotomy scar Abd soft ntnd no mass or ascites +bs GU normal male MS no joint effusions or deformity Ext no LE edema / no wounds or ulcers Neuro is alert, Ox 3 , nf    Home meds: asa, lipitor, plavix, tricor, lopressor, protonix, Altace  Na 137 K 4.0  CO2 27 BUN 10 Creat 1.50 (last 1.02 in 2016)  LFT"s ok Trop 0.04  WBC 7k HB 16    EKG (independ reviewed) > atrial flutter/fibrillation, VR 121,  abnormal R-wave progression, early transition; inferior infarct not new CXR (independ reviewed) > no active disease  CTA of chest/ abd /pelvis: 1. No thoracic aortic aneurysm or dissection. 2. The infrarenal abdominal aortic aneurysm is larger in the interval... it measures 5 x 5 cm today versus 4.6 x 4.5 cm previously. Recommend surgical consultation and follow up as below. No associated dissection today. Recommend followup by abdomen and pelvis CTA in 3-6 months, and vascular  surgery referral/consultation if not already obtained. This recommendation follows ACR consensus guidelines: White Paper of the ACR Incidental Findings Committee II on Vascular Findings. J Am Coll Radiol 2013; 10:789-794. 3. Dependent opacities in the bilateral lungs consistent with scar or atelectasis. 4. Atherosclerotic change in  the aorta. 5. Stable 12 mm nodule in the right adrenal gland.   Assessment: 1. DOE/ chest tightness - better with Rx in ED of afib with RVR, rec'd IV diltiazem and converted to NSR rhythm. Hx WPW syndrome ("failed" attempt at ablation). Cardiology OP notes don't address his WPW situation. Have d/w cardiology. We don't know to what extent he still may or may not have an accessory pathway.  For now he is converted and they recommended keep him on his home meds including MTP.  In WPW pts with active accessory pathway, typical Rx would be procainamide which would slow the accessory pathway in as well as the AVN, as opposed to dilt/ BB / adenosine which just slow the AVN.  For now will hold dilt IV. Admit to tele bed. Get serial trop.   2. CAD hx CABG 1997 3. Hx WPW syndrome - as above 4. Smoker 5. HTN 6. AAA - CTA tonight shows increasing size.  Per Dr Oneida Alar last note will treat when > 5.5 cm (5.0 cm today)  Plan - as above     [place] [facility] Pager [phone]   If 7PM-7AM, please contact night-coverage [email or web address] Password [facility] 07/31/2016, 6:35 PM

## 2016-07-31 NOTE — ED Provider Notes (Signed)
[facility] DEPT Provider Note   CSN: [id number] Arrival date & time: 07/31/16  1217   By signing my name below, I, Dolores Hoose, attest that this documentation has been prepared under the direction and in the presence of Julianne Rice, MD . Electronically Signed: Dolores Hoose, Scribe. 07/31/2016. 12:47 PM.  History   Chief Complaint Chief Complaint  Patient presents with  . Chest Pain   The history is provided by the patient. No language interpreter was used.    HPI Comments:  [name] is a 65 y.o. male who presents to the Emergency Department complaining of sudden-onset intermittent chest pain beginning 2 days ago. He describes his symptoms as a 3/10 sharp pain to the right chest. Denies any current pain. States he has a sensation like his heart is quivering. He has not taken anything outside of his normal medications for his symptoms. He reports associated chills and diaphoresis. Pt denies any SOB. The pt states that he has been very fatigued and stressed out for the past 4 weeks, and has has some trouble eating and sleeping. Pt is compliant with all of his at-home medications. He is a smoker. denies any fever or chills. No new lower extremity swelling or pain.   Past Medical History:  Diagnosis Date  . Abdominal aortic aneurysm ([place])   . Adrenal adenoma   . COPD (chronic obstructive pulmonary disease) ([place])   . Coronary artery disease    Multivessel, LVEF 50%, occluded SVG to RCA, DES SVG to OM system 2007  . Essential hypertension   . Hyperlipidemia   . Noncompliance   . Syncope    Acute gastroenteritis; vasovagal  . Systolic dysfunction    EF 45-50%  . Vitamin B12 deficiency anemia   . Wolff-Parkinson-White (WPW) syndrome    Failed ablation    Patient Active Problem List   Diagnosis Date Noted  . Tachyarrhythmia 07/31/2016  . Vitamin B12 deficiency anemia 07/15/2012  . Sigmoid diverticulitis 07/13/2012  . Perforation of sigmoid colon ([place]) 07/13/2012    . Abdominal aortic aneurysm ([place]) 07/13/2012  . Adrenal adenoma 07/13/2012  . Vasovagal syncope 06/28/2012  . Hematochezia 12/03/2010  . Essential hypertension, benign 02/19/2010  . CORONARY ATHEROSCLEROSIS NATIVE CORONARY ARTERY 02/19/2010  . HYPERLIPIDEMIA 09/10/2009  . TOBACCO ABUSE 09/10/2009  . WOLFF (WOLFE)-PARKINSON-WHITE (WPW) SYNDROME 09/10/2009  . GERD 09/10/2009    Past Surgical History:  Procedure Laterality Date  . BLADDER SURGERY    . CORONARY ANGIOPLASTY WITH STENT PLACEMENT    . CORONARY ARTERY BYPASS GRAFT  1995   LIMA to LAD , SVG to diagonal, SVG to OM1 and OM2 , SVG to RCA        Home Medications    Prior to Admission medications   Medication Sig Start Date End Date Taking? Authorizing Provider  aspirin 325 MG tablet Take 325 mg by mouth daily.     Yes Historical Provider, MD  atorvastatin (LIPITOR) 80 MG tablet TAKE 1 TABLET BY MOUTH ONCE DAILY FOR CHOLESTEROL. 06/23/16  Yes Satira Sark, MD  clopidogrel (PLAVIX) 75 MG tablet TAKE ONE TABLET BY MOUTH ONCE DAILY. 06/23/16  Yes Satira Sark, MD  fenofibrate (TRICOR) 145 MG tablet TAKE 1 TABLET BY MOUTH ONCE DAILY FOR CHOLESTEROL. 06/23/16  Yes Satira Sark, MD  metoprolol tartrate (LOPRESSOR) 25 MG tablet TAKE (1) TABLET BY MOUTH TWICE DAILY. 06/23/16  Yes Satira Sark, MD  pantoprazole (PROTONIX) 40 MG tablet TAKE 1 TABLET BY MOUTH ONCE DAILY FOR ACID  REFLUX. 04/21/16  Yes Satira Sark, MD  ramipril (ALTACE) 2.5 MG capsule TAKE (1) CAPSULE BY MOUTH ONCE DAILY. 06/23/16  Yes Satira Sark, MD    Family History Family History  Problem Relation Age of Onset  . Varicose Veins Mother   . Heart disease Father   . Heart attack Father   . Bleeding Disorder Sister   . Peripheral vascular disease Sister     amputation  . Diabetes Brother   . Varicose Veins Brother   . Diabetes Brother   . Heart disease Brother   . Varicose Veins Brother     Social History Social History   Substance Use Topics  . Smoking status: Current Every Day Smoker    Packs/day: 1.00    Years: 40.00    Types: Cigarettes    Start date: 06/03/1969  . Smokeless tobacco: Never Used     Comment: 10 ciggarettes a a day  . Alcohol use No     Allergies   Codeine   Review of Systems Review of Systems  Constitutional: Positive for diaphoresis and fatigue. Negative for fever.  Respiratory: Negative for cough, chest tightness and shortness of breath.   Cardiovascular: Positive for chest pain and palpitations. Negative for leg swelling.  Gastrointestinal: Negative for abdominal pain, diarrhea, nausea and vomiting.  Genitourinary: Negative for dysuria, flank pain and frequency.  Musculoskeletal: Positive for back pain. Negative for neck pain and neck stiffness.  Skin: Negative for rash and wound.  Neurological: Negative for dizziness, weakness, light-headedness, numbness and headaches.  All other systems reviewed and are negative.    Physical Exam Updated Vital Signs BP 108/69   Pulse 91   Temp 98.2 F (36.8 C) (Oral)   Resp 18   Ht 5\' 7"  (1.702 m)   Wt 180 lb (81.6 kg)   SpO2 95%   BMI 28.19 kg/m   Physical Exam  Constitutional: He is oriented to person, place, and time. He appears well-developed and well-nourished. No distress.  HENT:  Head: Normocephalic and atraumatic.  Mouth/Throat: Oropharynx is clear and moist. No oropharyngeal exudate.  Eyes: EOM are normal. Pupils are equal, round, and reactive to light.  Neck: Normal range of motion. Neck supple. No JVD present.  Cardiovascular:  Tachycardia. Irregularly irregular.  Pulmonary/Chest: Effort normal.  Diffuse wheezing throughout  Abdominal: Soft. Bowel sounds are normal. There is no tenderness. There is no rebound and no guarding.  Musculoskeletal: Normal range of motion. He exhibits no edema or tenderness.  No midline thoracic or lumbar tenderness. No CVA tenderness. No lower extremity swelling, asymmetry or  tenderness. Distal pulses are equal.  Neurological: He is alert and oriented to person, place, and time.  5/5 motor in all extremities. Sensation is fully intact.  Skin: Skin is warm and dry. Capillary refill takes less than 2 seconds. No rash noted. No erythema.  Psychiatric: He has a normal mood and affect. His behavior is normal.  Nursing note and vitals reviewed.    ED Treatments / Results  DIAGNOSTIC STUDIES:  Oxygen Saturation is 96% on RA, adequate by my interpretation.    COORDINATION OF CARE:  12:48 PM Discussed treatment plan with pt at bedside which includes admission to hospital and pt agreed to plan.  Labs (all labs ordered are listed, but only abnormal results are displayed) Labs Reviewed  BASIC METABOLIC PANEL - Abnormal; Notable for the following:       Result Value   Glucose, Bld 133 (*)    Creatinine,  Ser 1.50 (*)    GFR calc non Af Amer 48 (*)    GFR calc Af Amer 55 (*)    All other components within normal limits  TROPONIN I - Abnormal; Notable for the following:    Troponin I 0.04 (*)    All other components within normal limits  HEPATIC FUNCTION PANEL - Abnormal; Notable for the following:    Indirect Bilirubin 1.0 (*)    All other components within normal limits  CBC  TSH  URINALYSIS, ROUTINE W REFLEX MICROSCOPIC  T4, FREE  I-STAT TROPOININ, ED    EKG  EKG Interpretation  Date/Time:  Sunday July 31 2016 12:18:42 EST Ventricular Rate:  141 PR Interval:  144 QRS Duration: 84 QT Interval:  304 QTC Calculation: 465 R Axis:   -71 Text Interpretation:  Left axis deviation Pulmonary disease pattern Inferior infarct , age undetermined Abnormal ECG Confirmed by Lita Mains  MD, Gamal Todisco ([id number]) on 07/31/2016 1:03:02 PM       Radiology Dg Chest Port 1 View  Result Date: 07/31/2016 CLINICAL DATA:  Chest pain. EXAM: PORTABLE CHEST 1 VIEW COMPARISON:  June 03, 2016 FINDINGS: Mild atelectasis in the left lung base. The cardiomediastinal  silhouette is stable. No overt edema or focal infiltrate. IMPRESSION: No active disease. Electronically Signed   By: Dorise Bullion III M.D   On: 07/31/2016 13:18   Ct Angio Chest Aorta W And/or Wo Contrast  Result Date: 07/31/2016 CLINICAL DATA:  Known abdominal aortic aneurysm. Thoracic back pain for 2 days. Evaluate for dissection. EXAM: CT ANGIOGRAPHY CHEST, ABDOMEN AND PELVIS TECHNIQUE: Multidetector CT imaging through the chest, abdomen and pelvis was performed using the standard protocol during bolus administration of intravenous contrast. Multiplanar reconstructed images and MIPs were obtained and reviewed to evaluate the vascular anatomy. CONTRAST:  100 mL of Isovue 370 COMPARISON:  None. FINDINGS: CTA CHEST FINDINGS Cardiovascular: Coronary artery calcifications are identified in both the right and left coronary arteries. The heart size is unchanged without gross enlargement. No effusions. The ascending thoracic aorta measures 3.3 cm at the sino-tubular junction. The mid ascending thoracic aorta measures 2.8 cm. The arch measures 2.7 cm. The distal descending thoracic aorta measures 2.5 cm. No aneurysm identified. No dissection is noted. There is an aberrant origin of the right subclavian artery. Other branching vessels demonstrate normal origins. Atherosclerosis is associated with the thoracic aorta and at the origin of the branching vessels. Mediastinum/Nodes: No enlarged mediastinal, hilar, or axillary lymph nodes. Thyroid gland, trachea, and esophagus demonstrate no significant findings. Lungs/Pleura: Opacity dependently in the bases is probably atelectasis or scarring. No suspicious infiltrate to suggest pneumonia. Central airways are normal. No pneumothorax. Mild emphysematous changes in the apices. No suspicious nodules, masses, or infiltrates. Musculoskeletal: The patient is status post sternotomy with intact wires. No bony abnormalities seen in the chest. Review of the MIP images confirms  the above findings. CTA ABDOMEN AND PELVIS FINDINGS VASCULAR Aorta: Atherosclerotic changes seen in the abdominal aorta. The patient's known infrarenal aortic aneurysm is again identified. The maximum diameter of the aneurysm is 5 by 5 cm on series 5, image 115 today versus 4.6 x 4.5 cm previously. The abdominal aorta and narrows of normal caliber just above the bifurcation measuring 2.5 cm. No dissection identified. Celiac: Patent without evidence of aneurysm, dissection, vasculitis or significant stenosis. SMA: Patent without evidence of aneurysm, dissection, vasculitis or significant stenosis. Renals: Both renal arteries are patent without evidence of aneurysm, dissection, vasculitis, fibromuscular dysplasia or significant stenosis. IMA:  Patent without evidence of aneurysm, dissection, vasculitis or significant stenosis. Inflow: Patent without evidence of aneurysm, dissection, vasculitis or significant stenosis. Veins: Not well assessed due to timing of contrast. Review of the MIP images confirms the above findings. NON-VASCULAR Hepatobiliary: No focal liver abnormality is seen. No gallstones, gallbladder wall thickening, or biliary dilatation. Pancreas: Unremarkable. No pancreatic ductal dilatation or surrounding inflammatory changes. Spleen: Normal in size without focal abnormality. Adrenals/Urinary Tract: Stable 12 mm right adrenal nodule. No renal abnormalities. Left adrenal gland normal. Stomach/Bowel: The stomach and small bowel are normal. Mild wall thickening associated sigmoid colon is stable and apparently chronic. Scattered diverticuli without diverticulitis. The appendix is normal in appearance. Lymphatic: No adenopathy. Reproductive: Prostate is unremarkable. Other: Fat containing left inguinal hernia. No other additional abnormalities. Musculoskeletal: No acute or significant osseous findings. Review of the MIP images confirms the above findings. IMPRESSION: 1. No thoracic aortic aneurysm or  dissection. 2. The infrarenal abdominal aortic aneurysm is larger in the interval. It measures 5 x 5 cm today versus 4.6 x 4.5 cm previously. Recommend surgical consultation and follow up as below. No associated dissection today. Recommend followup by abdomen and pelvis CTA in 3-6 months, and vascular surgery referral/consultation if not already obtained. This recommendation follows ACR consensus guidelines: White Paper of the ACR Incidental Findings Committee II on Vascular Findings. J Am Coll Radiol 2013; 10:789-794. 3. Dependent opacities in the bilateral lungs consistent with scar or atelectasis. 4. Atherosclerotic change in the aorta. 5. Stable 12 mm nodule in the right adrenal gland. Electronically Signed   By: Dorise Bullion III M.D   On: 07/31/2016 16:02   Ct Angio Abd/pel W And/or Wo Contrast  Result Date: 07/31/2016 CLINICAL DATA:  Known abdominal aortic aneurysm. Thoracic back pain for 2 days. Evaluate for dissection. EXAM: CT ANGIOGRAPHY CHEST, ABDOMEN AND PELVIS TECHNIQUE: Multidetector CT imaging through the chest, abdomen and pelvis was performed using the standard protocol during bolus administration of intravenous contrast. Multiplanar reconstructed images and MIPs were obtained and reviewed to evaluate the vascular anatomy. CONTRAST:  100 mL of Isovue 370 COMPARISON:  None. FINDINGS: CTA CHEST FINDINGS Cardiovascular: Coronary artery calcifications are identified in both the right and left coronary arteries. The heart size is unchanged without gross enlargement. No effusions. The ascending thoracic aorta measures 3.3 cm at the sino-tubular junction. The mid ascending thoracic aorta measures 2.8 cm. The arch measures 2.7 cm. The distal descending thoracic aorta measures 2.5 cm. No aneurysm identified. No dissection is noted. There is an aberrant origin of the right subclavian artery. Other branching vessels demonstrate normal origins. Atherosclerosis is associated with the thoracic aorta and  at the origin of the branching vessels. Mediastinum/Nodes: No enlarged mediastinal, hilar, or axillary lymph nodes. Thyroid gland, trachea, and esophagus demonstrate no significant findings. Lungs/Pleura: Opacity dependently in the bases is probably atelectasis or scarring. No suspicious infiltrate to suggest pneumonia. Central airways are normal. No pneumothorax. Mild emphysematous changes in the apices. No suspicious nodules, masses, or infiltrates. Musculoskeletal: The patient is status post sternotomy with intact wires. No bony abnormalities seen in the chest. Review of the MIP images confirms the above findings. CTA ABDOMEN AND PELVIS FINDINGS VASCULAR Aorta: Atherosclerotic changes seen in the abdominal aorta. The patient's known infrarenal aortic aneurysm is again identified. The maximum diameter of the aneurysm is 5 by 5 cm on series 5, image 115 today versus 4.6 x 4.5 cm previously. The abdominal aorta and narrows of normal caliber just above the bifurcation measuring 2.5 cm.  No dissection identified. Celiac: Patent without evidence of aneurysm, dissection, vasculitis or significant stenosis. SMA: Patent without evidence of aneurysm, dissection, vasculitis or significant stenosis. Renals: Both renal arteries are patent without evidence of aneurysm, dissection, vasculitis, fibromuscular dysplasia or significant stenosis. IMA: Patent without evidence of aneurysm, dissection, vasculitis or significant stenosis. Inflow: Patent without evidence of aneurysm, dissection, vasculitis or significant stenosis. Veins: Not well assessed due to timing of contrast. Review of the MIP images confirms the above findings. NON-VASCULAR Hepatobiliary: No focal liver abnormality is seen. No gallstones, gallbladder wall thickening, or biliary dilatation. Pancreas: Unremarkable. No pancreatic ductal dilatation or surrounding inflammatory changes. Spleen: Normal in size without focal abnormality. Adrenals/Urinary Tract: Stable 12  mm right adrenal nodule. No renal abnormalities. Left adrenal gland normal. Stomach/Bowel: The stomach and small bowel are normal. Mild wall thickening associated sigmoid colon is stable and apparently chronic. Scattered diverticuli without diverticulitis. The appendix is normal in appearance. Lymphatic: No adenopathy. Reproductive: Prostate is unremarkable. Other: Fat containing left inguinal hernia. No other additional abnormalities. Musculoskeletal: No acute or significant osseous findings. Review of the MIP images confirms the above findings. IMPRESSION: 1. No thoracic aortic aneurysm or dissection. 2. The infrarenal abdominal aortic aneurysm is larger in the interval. It measures 5 x 5 cm today versus 4.6 x 4.5 cm previously. Recommend surgical consultation and follow up as below. No associated dissection today. Recommend followup by abdomen and pelvis CTA in 3-6 months, and vascular surgery referral/consultation if not already obtained. This recommendation follows ACR consensus guidelines: White Paper of the ACR Incidental Findings Committee II on Vascular Findings. J Am Coll Radiol 2013; 10:789-794. 3. Dependent opacities in the bilateral lungs consistent with scar or atelectasis. 4. Atherosclerotic change in the aorta. 5. Stable 12 mm nodule in the right adrenal gland. Electronically Signed   By: Dorise Bullion III M.D   On: 07/31/2016 16:02    Procedures Procedures (including critical care time)  Medications Ordered in ED Medications  sodium chloride 0.9 % bolus 500 mL (not administered)  diltiazem (CARDIZEM) 100 mg in dextrose 5 % 100 mL (1 mg/mL) infusion (0 mg/hr Intravenous Stopped 07/31/16 1358)  sodium chloride 0.9 % bolus 500 mL (0 mLs Intravenous Stopped 07/31/16 1659)  sodium chloride 0.9 % bolus 500 mL (500 mLs Intravenous New Bag/Given 07/31/16 1705)  iopamidol (ISOVUE-370) 76 % injection 100 mL (100 mLs Intravenous Contrast Given 07/31/16 1512)     Initial Impression /  Assessment and Plan / ED Course  I have reviewed the triage vital signs and the nursing notes.  Pertinent labs & imaging results that were available during my care of the patient were reviewed by me and considered in my medical decision making (see chart for details).  Clinical Course     Patient with tachycardia dysrhythmia that is irregular. Appears to be in atrial fibrillation. No history of this. Started on Cardizem drip with mild improvement of patient's heart rate but blood pressure dropped into the 80s. Cardizem was stopped with improvement of blood pressure. Patient also has been developing increased thoracic back pain. He thinks this is due to the stretcher. We'll get CT angiogram studies of the chest and abdomen to rule out dissection. CT angiogram without evidence of dissection. Patient has mildly enlarged abdominal aortic aneurysm compared to previous study. Heart rate has improved significantly and patient appears to be in sinus rhythm. Discuss with hospitalist who will see patient in emergency department and admit to telemetry bed. Final Clinical Impressions(s) / ED Diagnoses  Final diagnoses:  Tachyarrhythmia  Chest pain, unspecified type    New Prescriptions New Prescriptions   No medications on file  I personally performed the services described in this documentation, which was scribed in my presence. The recorded information has been reviewed and is accurate.       Julianne Rice, MD 07/31/16 [phone]

## 2016-07-31 NOTE — ED Triage Notes (Signed)
PT c/o central/chest sided chest pain after shoveling snow x2 days again with increased SOB on exertion and nausea at times.

## 2016-08-01 DIAGNOSIS — Z833 Family history of diabetes mellitus: Secondary | ICD-10-CM | POA: Diagnosis not present

## 2016-08-01 DIAGNOSIS — Z7902 Long term (current) use of antithrombotics/antiplatelets: Secondary | ICD-10-CM | POA: Diagnosis not present

## 2016-08-01 DIAGNOSIS — I714 Abdominal aortic aneurysm, without rupture: Secondary | ICD-10-CM | POA: Diagnosis not present

## 2016-08-01 DIAGNOSIS — R748 Abnormal levels of other serum enzymes: Secondary | ICD-10-CM

## 2016-08-01 DIAGNOSIS — E785 Hyperlipidemia, unspecified: Secondary | ICD-10-CM | POA: Diagnosis not present

## 2016-08-01 DIAGNOSIS — K219 Gastro-esophageal reflux disease without esophagitis: Secondary | ICD-10-CM | POA: Diagnosis not present

## 2016-08-01 DIAGNOSIS — Z951 Presence of aortocoronary bypass graft: Secondary | ICD-10-CM | POA: Diagnosis not present

## 2016-08-01 DIAGNOSIS — I10 Essential (primary) hypertension: Secondary | ICD-10-CM | POA: Diagnosis not present

## 2016-08-01 DIAGNOSIS — I25810 Atherosclerosis of coronary artery bypass graft(s) without angina pectoris: Secondary | ICD-10-CM | POA: Diagnosis not present

## 2016-08-01 DIAGNOSIS — I456 Pre-excitation syndrome: Secondary | ICD-10-CM

## 2016-08-01 DIAGNOSIS — I471 Supraventricular tachycardia: Secondary | ICD-10-CM

## 2016-08-01 DIAGNOSIS — I4892 Unspecified atrial flutter: Secondary | ICD-10-CM | POA: Diagnosis not present

## 2016-08-01 DIAGNOSIS — Z885 Allergy status to narcotic agent status: Secondary | ICD-10-CM | POA: Diagnosis not present

## 2016-08-01 DIAGNOSIS — Z8249 Family history of ischemic heart disease and other diseases of the circulatory system: Secondary | ICD-10-CM | POA: Diagnosis not present

## 2016-08-01 DIAGNOSIS — Z79899 Other long term (current) drug therapy: Secondary | ICD-10-CM | POA: Diagnosis not present

## 2016-08-01 DIAGNOSIS — Z7982 Long term (current) use of aspirin: Secondary | ICD-10-CM | POA: Diagnosis not present

## 2016-08-01 DIAGNOSIS — Z955 Presence of coronary angioplasty implant and graft: Secondary | ICD-10-CM | POA: Diagnosis not present

## 2016-08-01 DIAGNOSIS — I4891 Unspecified atrial fibrillation: Secondary | ICD-10-CM | POA: Diagnosis not present

## 2016-08-01 DIAGNOSIS — J449 Chronic obstructive pulmonary disease, unspecified: Secondary | ICD-10-CM | POA: Diagnosis not present

## 2016-08-01 LAB — TROPONIN I
Troponin I: 0.06 ng/mL (ref <0.03)
Troponin I: 0.06 ng/mL (ref <0.03)

## 2016-08-01 LAB — CREATININE, SERUM
CREATININE: 1.19 mg/dL (ref 0.61–1.24)
GFR calc Af Amer: >60 mL/min (ref >60)
GFR calc non Af Amer: >60 mL/min (ref >60)

## 2016-08-01 LAB — BASIC METABOLIC PANEL
Anion gap: 6 (ref 5–15)
BUN: 9 mg/dL (ref 6–20)
CHLORIDE: 102 mmol/L (ref 101–111)
CO2: 27 mmol/L (ref 22–32)
CREATININE: 1.08 mg/dL (ref 0.61–1.24)
Calcium: 9.4 mg/dL (ref 8.9–10.3)
GFR calc Af Amer: >60 mL/min (ref >60)
GFR calc non Af Amer: >60 mL/min (ref >60)
GLUCOSE: 122 mg/dL — AB (ref 65–99)
POTASSIUM: 3.6 mmol/L (ref 3.5–5.1)
Sodium: 135 mmol/L (ref 135–145)

## 2016-08-01 MED ORDER — METOPROLOL TARTRATE 25 MG PO TABS
37.5000 mg | ORAL_TABLET | Freq: Two times a day (BID) | ORAL | Status: DC
  Administered 2016-08-01 – 2016-08-02 (×2): 37.5 mg via ORAL
  Filled 2016-08-01 (×2): qty 2

## 2016-08-01 NOTE — Progress Notes (Signed)
PROGRESS NOTE    [name]  [id number] DOB: 06/24/1951 DOA: 07/31/2016 PCP: [name], MD    Brief Narrative:  65 y.o. male with history of CAD sp CABG 1997 and hx of Wolff-Parkinson-White syndrome, f/b cardiology (Dr [name]), also hx of COPD, tobacco use, HTN.  Patient presented to ED with c/o of 2-3 days hx of DOE, gen'd weakness and intermittent chest tightness/ "quivering" since shoveling snow on Friday two days ago  Assessment & Plan:   Principal Problem:   Atrial fibrillation with RVR ([place]) - Complicated cardiac cases such will consult cardiology for further management of rate controlling agents. Patient has had fluctuation from bradycardia to tachycardia as well. Will defer medical management cardiologist.  Active Problems:   Essential hypertension, benign - Patient is on metoprolol and ramipril    History CABG x 7 1997   Abdominal aortic aneurysm [facility]) - Patient will require outpatient evaluation by vascular surgeon    Tachyarrhythmia   History of Wolff-Parkinson-White (WPW) syndrome   Chest tightness - No chest pain reported today   Smoker - Recommend cessation   DVT prophylaxis: Lovenox Code Status: Full code Family Communication: Discussed directly with patient Disposition Plan: Once cardiology feels patient is safe discharge   Consultants:   Cardiology   Procedures: None  Antimicrobials: None  Subjective: Patient has no new complaints today.  Objective: Vitals:   07/31/16 1820 07/31/16 2124 08/01/16 0639 08/01/16 1043  BP: 129/69 (!) 125/57 (!) 107/57 110/66  Pulse: 91 (!) 39 65   Resp: 20 20 20    Temp: 98 F (36.7 C) 98 F (36.7 C) 98.3 F (36.8 C)   TempSrc: Oral Oral Oral   SpO2: 98% 96% 94%   Weight: 80.7 kg (177 lb 14.6 oz)  80.3 kg (177 lb 0.5 oz)   Height: 5\' 7"  (1.702 m)       Intake/Output Summary (Last 24 hours) at 08/01/16 1400 Last data filed at 08/01/16 1051  Gross per 24 hour  Intake          2139.67  ml  Output              525 ml  Net          1614.67 ml   Filed Weights   07/31/16 1224 07/31/16 1820 08/01/16 0639  Weight: 81.6 kg (180 lb) 80.7 kg (177 lb 14.6 oz) 80.3 kg (177 lb 0.5 oz)    Examination:  General exam: Appears calm and comfortable  Respiratory system: Clear to auscultation. Respiratory effort normal. Cardiovascular system: S1 & S2 heard, RRR. No JVD, murmurs, rubs, gallops or clicks. No pedal edema. Gastrointestinal system: Abdomen is nondistended, soft and nontender. No organomegaly or masses felt. Normal bowel sounds heard. Central nervous system: Alert and oriented. No focal neurological deficits. Extremities: Symmetric 5 x 5 power. Skin: No rashes, lesions or ulcers Psychiatry: Judgement and insight appear normal. Mood & affect appropriate.   Data Reviewed: I have personally reviewed following labs and imaging studies  CBC:  Recent Labs Lab 07/31/16 1238 07/31/16 2302  WBC 7.6 7.3  HGB 16.0 14.5  HCT 48.6 44.4  MCV 93.6 93.9  PLT 207 [id number]   Basic Metabolic Panel:  Recent Labs Lab 07/31/16 1238 07/31/16 2302 08/01/16 0559  NA 137  --  135  K 4.0  --  3.6  CL 102  --  102  CO2 27  --  27  GLUCOSE 133*  --  122*  BUN 10  --  9  CREATININE 1.50* 1.19 1.08  CALCIUM 10.2  --  9.4   GFR: Estimated Creatinine Clearance: 71.1 mL/min (by C-G formula based on SCr of 1.08 mg/dL). Liver Function Tests:  Recent Labs Lab 07/31/16 1249  AST 30  ALT 30  ALKPHOS 85  BILITOT 1.2  PROT 6.8  ALBUMIN 4.2   No results for input(s): LIPASE, AMYLASE in the last 168 hours. No results for input(s): AMMONIA in the last 168 hours. Coagulation Profile: No results for input(s): INR, PROTIME in the last 168 hours. Cardiac Enzymes:  Recent Labs Lab 07/31/16 1238 07/31/16 2302 08/01/16 0559  TROPONINI 0.04* 0.06* 0.06*   BNP (last 3 results) No results for input(s): PROBNP in the last 8760 hours. HbA1C: No results for input(s): HGBA1C in the last  72 hours. CBG: No results for input(s): GLUCAP in the last 168 hours. Lipid Profile: No results for input(s): CHOL, HDL, LDLCALC, TRIG, CHOLHDL, LDLDIRECT in the last 72 hours. Thyroid Function Tests:  Recent Labs  07/31/16 1249 07/31/16 1250  TSH 1.243  --   FREET4  --  0.89   Anemia Panel: No results for input(s): VITAMINB12, FOLATE, FERRITIN, TIBC, IRON, RETICCTPCT in the last 72 hours. Sepsis Labs: No results for input(s): PROCALCITON, LATICACIDVEN in the last 168 hours.  No results found for this or any previous visit (from the past 240 hour(s)).       Radiology Studies: Dg Chest Port 1 View  Result Date: 07/31/2016 CLINICAL DATA:  Chest pain. EXAM: PORTABLE CHEST 1 VIEW COMPARISON:  June 03, 2016 FINDINGS: Mild atelectasis in the left lung base. The cardiomediastinal silhouette is stable. No overt edema or focal infiltrate. IMPRESSION: No active disease. Electronically Signed   By: Dorise Bullion III M.D   On: 07/31/2016 13:18   Ct Angio Chest Aorta W And/or Wo Contrast  Result Date: 07/31/2016 CLINICAL DATA:  Known abdominal aortic aneurysm. Thoracic back pain for 2 days. Evaluate for dissection. EXAM: CT ANGIOGRAPHY CHEST, ABDOMEN AND PELVIS TECHNIQUE: Multidetector CT imaging through the chest, abdomen and pelvis was performed using the standard protocol during bolus administration of intravenous contrast. Multiplanar reconstructed images and MIPs were obtained and reviewed to evaluate the vascular anatomy. CONTRAST:  100 mL of Isovue 370 COMPARISON:  None. FINDINGS: CTA CHEST FINDINGS Cardiovascular: Coronary artery calcifications are identified in both the right and left coronary arteries. The heart size is unchanged without gross enlargement. No effusions. The ascending thoracic aorta measures 3.3 cm at the sino-tubular junction. The mid ascending thoracic aorta measures 2.8 cm. The arch measures 2.7 cm. The distal descending thoracic aorta measures 2.5 cm. No  aneurysm identified. No dissection is noted. There is an aberrant origin of the right subclavian artery. Other branching vessels demonstrate normal origins. Atherosclerosis is associated with the thoracic aorta and at the origin of the branching vessels. Mediastinum/Nodes: No enlarged mediastinal, hilar, or axillary lymph nodes. Thyroid gland, trachea, and esophagus demonstrate no significant findings. Lungs/Pleura: Opacity dependently in the bases is probably atelectasis or scarring. No suspicious infiltrate to suggest pneumonia. Central airways are normal. No pneumothorax. Mild emphysematous changes in the apices. No suspicious nodules, masses, or infiltrates. Musculoskeletal: The patient is status post sternotomy with intact wires. No bony abnormalities seen in the chest. Review of the MIP images confirms the above findings. CTA ABDOMEN AND PELVIS FINDINGS VASCULAR Aorta: Atherosclerotic changes seen in the abdominal aorta. The patient's known infrarenal aortic aneurysm is again identified. The maximum diameter of the aneurysm is 5 by 5  cm on series 5, image 115 today versus 4.6 x 4.5 cm previously. The abdominal aorta and narrows of normal caliber just above the bifurcation measuring 2.5 cm. No dissection identified. Celiac: Patent without evidence of aneurysm, dissection, vasculitis or significant stenosis. SMA: Patent without evidence of aneurysm, dissection, vasculitis or significant stenosis. Renals: Both renal arteries are patent without evidence of aneurysm, dissection, vasculitis, fibromuscular dysplasia or significant stenosis. IMA: Patent without evidence of aneurysm, dissection, vasculitis or significant stenosis. Inflow: Patent without evidence of aneurysm, dissection, vasculitis or significant stenosis. Veins: Not well assessed due to timing of contrast. Review of the MIP images confirms the above findings. NON-VASCULAR Hepatobiliary: No focal liver abnormality is seen. No gallstones, gallbladder  wall thickening, or biliary dilatation. Pancreas: Unremarkable. No pancreatic ductal dilatation or surrounding inflammatory changes. Spleen: Normal in size without focal abnormality. Adrenals/Urinary Tract: Stable 12 mm right adrenal nodule. No renal abnormalities. Left adrenal gland normal. Stomach/Bowel: The stomach and small bowel are normal. Mild wall thickening associated sigmoid colon is stable and apparently chronic. Scattered diverticuli without diverticulitis. The appendix is normal in appearance. Lymphatic: No adenopathy. Reproductive: Prostate is unremarkable. Other: Fat containing left inguinal hernia. No other additional abnormalities. Musculoskeletal: No acute or significant osseous findings. Review of the MIP images confirms the above findings. IMPRESSION: 1. No thoracic aortic aneurysm or dissection. 2. The infrarenal abdominal aortic aneurysm is larger in the interval. It measures 5 x 5 cm today versus 4.6 x 4.5 cm previously. Recommend surgical consultation and follow up as below. No associated dissection today. Recommend followup by abdomen and pelvis CTA in 3-6 months, and vascular surgery referral/consultation if not already obtained. This recommendation follows ACR consensus guidelines: White Paper of the ACR Incidental Findings Committee II on Vascular Findings. J Am Coll Radiol 2013; 10:789-794. 3. Dependent opacities in the bilateral lungs consistent with scar or atelectasis. 4. Atherosclerotic change in the aorta. 5. Stable 12 mm nodule in the right adrenal gland. Electronically Signed   By: Dorise Bullion III M.D   On: 07/31/2016 16:02   Ct Angio Abd/pel W And/or Wo Contrast  Result Date: 07/31/2016 CLINICAL DATA:  Known abdominal aortic aneurysm. Thoracic back pain for 2 days. Evaluate for dissection. EXAM: CT ANGIOGRAPHY CHEST, ABDOMEN AND PELVIS TECHNIQUE: Multidetector CT imaging through the chest, abdomen and pelvis was performed using the standard protocol during bolus  administration of intravenous contrast. Multiplanar reconstructed images and MIPs were obtained and reviewed to evaluate the vascular anatomy. CONTRAST:  100 mL of Isovue 370 COMPARISON:  None. FINDINGS: CTA CHEST FINDINGS Cardiovascular: Coronary artery calcifications are identified in both the right and left coronary arteries. The heart size is unchanged without gross enlargement. No effusions. The ascending thoracic aorta measures 3.3 cm at the sino-tubular junction. The mid ascending thoracic aorta measures 2.8 cm. The arch measures 2.7 cm. The distal descending thoracic aorta measures 2.5 cm. No aneurysm identified. No dissection is noted. There is an aberrant origin of the right subclavian artery. Other branching vessels demonstrate normal origins. Atherosclerosis is associated with the thoracic aorta and at the origin of the branching vessels. Mediastinum/Nodes: No enlarged mediastinal, hilar, or axillary lymph nodes. Thyroid gland, trachea, and esophagus demonstrate no significant findings. Lungs/Pleura: Opacity dependently in the bases is probably atelectasis or scarring. No suspicious infiltrate to suggest pneumonia. Central airways are normal. No pneumothorax. Mild emphysematous changes in the apices. No suspicious nodules, masses, or infiltrates. Musculoskeletal: The patient is status post sternotomy with intact wires. No bony abnormalities seen in the  chest. Review of the MIP images confirms the above findings. CTA ABDOMEN AND PELVIS FINDINGS VASCULAR Aorta: Atherosclerotic changes seen in the abdominal aorta. The patient's known infrarenal aortic aneurysm is again identified. The maximum diameter of the aneurysm is 5 by 5 cm on series 5, image 115 today versus 4.6 x 4.5 cm previously. The abdominal aorta and narrows of normal caliber just above the bifurcation measuring 2.5 cm. No dissection identified. Celiac: Patent without evidence of aneurysm, dissection, vasculitis or significant stenosis. SMA:  Patent without evidence of aneurysm, dissection, vasculitis or significant stenosis. Renals: Both renal arteries are patent without evidence of aneurysm, dissection, vasculitis, fibromuscular dysplasia or significant stenosis. IMA: Patent without evidence of aneurysm, dissection, vasculitis or significant stenosis. Inflow: Patent without evidence of aneurysm, dissection, vasculitis or significant stenosis. Veins: Not well assessed due to timing of contrast. Review of the MIP images confirms the above findings. NON-VASCULAR Hepatobiliary: No focal liver abnormality is seen. No gallstones, gallbladder wall thickening, or biliary dilatation. Pancreas: Unremarkable. No pancreatic ductal dilatation or surrounding inflammatory changes. Spleen: Normal in size without focal abnormality. Adrenals/Urinary Tract: Stable 12 mm right adrenal nodule. No renal abnormalities. Left adrenal gland normal. Stomach/Bowel: The stomach and small bowel are normal. Mild wall thickening associated sigmoid colon is stable and apparently chronic. Scattered diverticuli without diverticulitis. The appendix is normal in appearance. Lymphatic: No adenopathy. Reproductive: Prostate is unremarkable. Other: Fat containing left inguinal hernia. No other additional abnormalities. Musculoskeletal: No acute or significant osseous findings. Review of the MIP images confirms the above findings. IMPRESSION: 1. No thoracic aortic aneurysm or dissection. 2. The infrarenal abdominal aortic aneurysm is larger in the interval. It measures 5 x 5 cm today versus 4.6 x 4.5 cm previously. Recommend surgical consultation and follow up as below. No associated dissection today. Recommend followup by abdomen and pelvis CTA in 3-6 months, and vascular surgery referral/consultation if not already obtained. This recommendation follows ACR consensus guidelines: White Paper of the ACR Incidental Findings Committee II on Vascular Findings. J Am Coll Radiol 2013; 10:789-794.  3. Dependent opacities in the bilateral lungs consistent with scar or atelectasis. 4. Atherosclerotic change in the aorta. 5. Stable 12 mm nodule in the right adrenal gland. Electronically Signed   By: Dorise Bullion III M.D   On: 07/31/2016 16:02        Scheduled Meds: . atorvastatin  80 mg Oral q1800  . clopidogrel  75 mg Oral Daily  . enoxaparin (LOVENOX) injection  40 mg Subcutaneous Q24H  . fenofibrate  160 mg Oral Daily  . metoprolol tartrate  25 mg Oral BID  . ramipril  2.5 mg Oral Daily  . sodium chloride flush  3 mL Intravenous Q12H   Continuous Infusions: . dextrose 5 % and 0.45% NaCl 75 mL/hr at 08/01/16 1334     LOS: 1 day    Time spent: > 35 minutes    Velvet Bathe, MD [facility] Pager [phone]  If 7PM-7AM, please contact night-coverage [email or web address] Password [email or web address] 08/01/2016, 2:00 PM

## 2016-08-01 NOTE — Consult Note (Signed)
Primary cardiologist: Dr Johnny Bridge Consulting cardiologist:Dr [name] Requesting physician: Dr Wendee Beavers Indication: Tachycardia  Clinical Summary [name] is a 65 y.o.male history of CAD with prior CABG, AAA, COPD, HTN, HL. There is an unclear reported history of WPW.  There is mention as far back as 2007 of history of WPW with reported failed attempt at ablation, I am not able to find any other details. A note mentions the ablation may have been done around 1996. From chart he has been on beta blockers at least since 2006 without difficulty. His baseline EKG in SR has PR of 160, QRS of 80.   Reports on Friday after sweeping snow had episode of 8/10 right sided chest pain. Last just a few seconds, total of 2 episodes. No other specific symptoms at that time. Over the weekend just felt fatigued. Reports recent significant stress, not sleeping well. He does report on and off heart fluttering for quite some time. High caffeine intake regulary.    K 4, Cr 1.5, Hgb 11.7, Plt 207, TSH 1.24,  Trop 0.04-->0.06-->0.06 EKG: MAT CTA: infrarenal AAA 5 x 5 cm.   Allergies  Allergen Reactions  . Codeine     Medications Scheduled Medications: . atorvastatin  80 mg Oral q1800  . clopidogrel  75 mg Oral Daily  . enoxaparin (LOVENOX) injection  40 mg Subcutaneous Q24H  . fenofibrate  160 mg Oral Daily  . metoprolol tartrate  25 mg Oral BID  . ramipril  2.5 mg Oral Daily  . sodium chloride flush  3 mL Intravenous Q12H     Infusions: . dextrose 5 % and 0.45% NaCl 75 mL/hr at 08/01/16 1334     PRN Medications:  acetaminophen **OR** acetaminophen, ondansetron **OR** ondansetron (ZOFRAN) IV, polyethylene glycol   Past Medical History:  Diagnosis Date  . Abdominal aortic aneurysm ([place])   . Adrenal adenoma   . COPD (chronic obstructive pulmonary disease) ([place])   . Coronary artery disease    Multivessel, LVEF 50%, occluded SVG to RCA, DES SVG to OM system 2007  . Essential  hypertension   . Hyperlipidemia   . Noncompliance   . Syncope    Acute gastroenteritis; vasovagal  . Systolic dysfunction    EF 45-50%  . Vitamin B12 deficiency anemia   . Wolff-Parkinson-White (WPW) syndrome    Failed ablation    Past Surgical History:  Procedure Laterality Date  . BLADDER SURGERY    . CORONARY ANGIOPLASTY WITH STENT PLACEMENT    . CORONARY ARTERY BYPASS GRAFT  1995   LIMA to LAD , SVG to diagonal, SVG to OM1 and OM2 , SVG to RCA     Family History  Problem Relation Age of Onset  . Varicose Veins Mother   . Heart disease Father   . Heart attack Father   . Bleeding Disorder Sister   . Peripheral vascular disease Sister     amputation  . Diabetes Brother   . Varicose Veins Brother   . Diabetes Brother   . Heart disease Brother   . Varicose Veins Brother     Social History Mr. Nachman reports that he has been smoking Cigarettes.  He started smoking about 47 years ago. He has a 40.00 pack-year smoking history. He has never used smokeless tobacco. Mr. Banther reports that he does not drink alcohol.  Review of Systems CONSTITUTIONAL: per HPI HEENT: Eyes: No visual loss, blurred vision, double vision or yellow sclerae. No hearing loss, sneezing, congestion, runny nose  or sore throat.  SKIN: No rash or itching.  CARDIOVASCULAR:per HPI RESPIRATORY: No shortness of breath, cough or sputum.  GASTROINTESTINAL: No anorexia, nausea, vomiting or diarrhea. No abdominal pain or blood.  GENITOURINARY: no polyuria, no dysuria NEUROLOGICAL: No headache, dizziness, syncope, paralysis, ataxia, numbness or tingling in the extremities. No change in bowel or bladder control.  MUSCULOSKELETAL: No muscle, back pain, joint pain or stiffness.  HEMATOLOGIC: No anemia, bleeding or bruising.  LYMPHATICS: No enlarged nodes. No history of splenectomy.  PSYCHIATRIC: No history of depression or anxiety.      Physical Examination Blood pressure 118/77, pulse 70, temperature 98.3  F (36.8 C), temperature source Oral, resp. rate 17, height 5\' 7"  (1.702 m), weight 177 lb 0.5 oz (80.3 kg), SpO2 93 %.  Intake/Output Summary (Last 24 hours) at 08/01/16 1535 Last data filed at 08/01/16 1300  Gross per 24 hour  Intake          2259.67 ml  Output             1350 ml  Net           909.67 ml    HEENT: sclera clear, throat clear  Cardiovascular: RRR, no m/r/g, no jvd  Respiratory: CTAB  GI: abdomen soft, NT, ND  MSK: no LE edema  Neuro: no focal deficits  Psych: appropriate afefct   Lab Results  Basic Metabolic Panel:  Recent Labs Lab 07/31/16 1238 07/31/16 2302 08/01/16 0559  NA 137  --  135  K 4.0  --  3.6  CL 102  --  102  CO2 27  --  27  GLUCOSE 133*  --  122*  BUN 10  --  9  CREATININE 1.50* 1.19 1.08  CALCIUM 10.2  --  9.4    Liver Function Tests:  Recent Labs Lab 07/31/16 1249  AST 30  ALT 30  ALKPHOS 85  BILITOT 1.2  PROT 6.8  ALBUMIN 4.2    CBC:  Recent Labs Lab 07/31/16 1238 07/31/16 2302  WBC 7.6 7.3  HGB 16.0 14.5  HCT 48.6 44.4  MCV 93.6 93.9  PLT 207 184    Cardiac Enzymes:  Recent Labs Lab 07/31/16 1238 07/31/16 2302 08/01/16 0559  TROPONINI 0.04* 0.06* 0.06*    BNP: Invalid input(s): POCBNP     Impression/Recommendations 1. Tachycardia - EKGs and telemetry show MAT, do not see evidence of afib or aflutter - no anticoag is indicated - high caffeine intake, counseled on cutting back - we will increase lopressor to 37.5mg  bid, counseled ok to take additional 25mg  prn palpitations  2. WPW - reported history of WPW. The details are unclear. From what I can gather around 1996 patient had a mapping/ablation procedure by Dr Caryl Comes that was "unsuccesful". Did not have any repeat evaluation, partly by his choice. His baseline EKGs have normal PR, no signs of preexcitation - he has been on lopressor at least since 2006 according to charting without issues. We will increase dose as described above, refer  patient to Dr Lovena Le to see if any additional steps need to be taken in this scenario  3. Elevated troponin - mild flat elevation in setting of tachycardia, no plans for ischemic testing at this time.   4. AAA - I have messaged Cresenciano Lick regarding the CTA he had this admission for vascular's review   Heart rates controlled, still with MAT at times. Can consider d/c today on higher dose lopressor or monitor overnight.   [name],  M.D

## 2016-08-01 NOTE — Progress Notes (Signed)
Pt has been resting. Has not had any chest pain. Awaiting cardiology and hosp for eval. Nad.

## 2016-08-02 DIAGNOSIS — I4891 Unspecified atrial fibrillation: Principal | ICD-10-CM

## 2016-08-02 MED ORDER — METOPROLOL TARTRATE 37.5 MG PO TABS: 37.5000 mg | ORAL_TABLET | Freq: Two times a day (BID) | ORAL | 0 refills | Status: DC

## 2016-08-02 NOTE — Discharge Summary (Signed)
Physician Discharge Summary  [name] [id number] DOB: 01/10/1951 DOA: 07/31/2016  PCP: [name], MD  Admit date: 07/31/2016 Discharge date: 08/02/2016  Time spent: > 35 minutes  Recommendations for Outpatient Follow-up:  1. Monitor blood pressures and adjust medications accordingly. Ensure patient follows up with cardiologist   Discharge Diagnoses:  Principal Problem:   Atrial fibrillation with RVR ([place]) Active Problems:   Essential hypertension, benign   History CABG x 7 1997   Abdominal aortic aneurysm (HCC)   Tachyarrhythmia   History of Wolff-Parkinson-White (WPW) syndrome   Chest tightness   Smoker   Discharge Condition: stable  Diet recommendation: heart healthy  Filed Weights   07/31/16 1820 08/01/16 0639 08/02/16 0548  Weight: 80.7 kg (177 lb 14.6 oz) 80.3 kg (177 lb 0.5 oz) 80.2 kg (176 lb 12.9 oz)    History of present illness:  65 y.o. male with history of CAD sp CABG 1997 and hx of Wolff-Parkinson-White syndrome, f/b cardiology (Dr [name]), also hx of COPD, tobacco use, HTN.  Patient presented to ED with c/o of 2-3 days hx of DOE, gen'd weakness and intermittent chest tightness/ "quivering" since shoveling snow on Friday two days ago  Hospital Course:  Chest pain - Patient had mild flat elevation in setting of tachycardia as such there were no plans for ischemic testing. This was per cardiology decision after they were consulted for further assistance.  WPW - Plan will be to increase Lopressor to 37.5 mg twice a day and aspirin cardiology recommendations 25 mg when necessary palpitations. Reportedly this admission patient presented with MAT  Further known medical conditions listed above will continue home medication list below  Procedures:  None  Consultations:  Cardiology evaluated by Dr. Harl Bowie  Discharge Exam: Vitals:   08/01/16 2032 08/02/16 0548  BP: 110/69 109/69  Pulse: 70 62  Resp: 18 18  Temp: 98.4 F (36.9 C)  98.3 F (36.8 C)    General: Pt in nad, alert and awake Cardiovascular: rrr, no rubs Respiratory: no increased wob, no wheezes  Discharge Instructions   Discharge Instructions    Call MD for:  severe uncontrolled pain    Complete by:  As directed    Call MD for:  temperature >100.4    Complete by:  As directed    Diet - low sodium heart healthy    Complete by:  As directed    Discharge instructions    Complete by:  As directed    Please continue routine follow-up with your cardiologist unless there is acute issue or concern which requires follow-up sooner.   Increase activity slowly    Complete by:  As directed      Current Discharge Medication List    CONTINUE these medications which have CHANGED   Details  metoprolol tartrate 37.5 MG TABS Take 37.5 mg by mouth 2 (two) times daily. Qty: 60 tablet, Refills: 0      CONTINUE these medications which have NOT CHANGED   Details  aspirin 325 MG tablet Take 325 mg by mouth daily.      atorvastatin (LIPITOR) 80 MG tablet TAKE 1 TABLET BY MOUTH ONCE DAILY FOR CHOLESTEROL. Qty: 30 tablet, Refills: 11    clopidogrel (PLAVIX) 75 MG tablet TAKE ONE TABLET BY MOUTH ONCE DAILY. Qty: 90 tablet, Refills: 3    fenofibrate (TRICOR) 145 MG tablet TAKE 1 TABLET BY MOUTH ONCE DAILY FOR CHOLESTEROL. Qty: 30 tablet, Refills: 11    pantoprazole (PROTONIX) 40 MG tablet TAKE 1  TABLET BY MOUTH ONCE DAILY FOR ACID REFLUX. Qty: 30 tablet, Refills: 3    ramipril (ALTACE) 2.5 MG capsule TAKE (1) CAPSULE BY MOUTH ONCE DAILY. Qty: 30 capsule, Refills: 11       Allergies  Allergen Reactions  . Codeine    Follow-up Information    [name], MD Follow up on 08/12/2016.   Specialty:  Cardiology Why:  3:30 pm Contact information: [address] [place]  [id number] [phone]            The results of significant diagnostics from this hospitalization (including imaging, microbiology, ancillary and laboratory) are listed  below for reference.    Significant Diagnostic Studies: Dg Chest Port 1 View  Result Date: 07/31/2016 CLINICAL DATA:  Chest pain. EXAM: PORTABLE CHEST 1 VIEW COMPARISON:  June 03, 2016 FINDINGS: Mild atelectasis in the left lung base. The cardiomediastinal silhouette is stable. No overt edema or focal infiltrate. IMPRESSION: No active disease. Electronically Signed   By: Dorise Bullion III M.D   On: 07/31/2016 13:18   Ct Angio Chest Aorta W And/or Wo Contrast  Result Date: 07/31/2016 CLINICAL DATA:  Known abdominal aortic aneurysm. Thoracic back pain for 2 days. Evaluate for dissection. EXAM: CT ANGIOGRAPHY CHEST, ABDOMEN AND PELVIS TECHNIQUE: Multidetector CT imaging through the chest, abdomen and pelvis was performed using the standard protocol during bolus administration of intravenous contrast. Multiplanar reconstructed images and MIPs were obtained and reviewed to evaluate the vascular anatomy. CONTRAST:  100 mL of Isovue 370 COMPARISON:  None. FINDINGS: CTA CHEST FINDINGS Cardiovascular: Coronary artery calcifications are identified in both the right and left coronary arteries. The heart size is unchanged without gross enlargement. No effusions. The ascending thoracic aorta measures 3.3 cm at the sino-tubular junction. The mid ascending thoracic aorta measures 2.8 cm. The arch measures 2.7 cm. The distal descending thoracic aorta measures 2.5 cm. No aneurysm identified. No dissection is noted. There is an aberrant origin of the right subclavian artery. Other branching vessels demonstrate normal origins. Atherosclerosis is associated with the thoracic aorta and at the origin of the branching vessels. Mediastinum/Nodes: No enlarged mediastinal, hilar, or axillary lymph nodes. Thyroid gland, trachea, and esophagus demonstrate no significant findings. Lungs/Pleura: Opacity dependently in the bases is probably atelectasis or scarring. No suspicious infiltrate to suggest pneumonia. Central airways are  normal. No pneumothorax. Mild emphysematous changes in the apices. No suspicious nodules, masses, or infiltrates. Musculoskeletal: The patient is status post sternotomy with intact wires. No bony abnormalities seen in the chest. Review of the MIP images confirms the above findings. CTA ABDOMEN AND PELVIS FINDINGS VASCULAR Aorta: Atherosclerotic changes seen in the abdominal aorta. The patient's known infrarenal aortic aneurysm is again identified. The maximum diameter of the aneurysm is 5 by 5 cm on series 5, image 115 today versus 4.6 x 4.5 cm previously. The abdominal aorta and narrows of normal caliber just above the bifurcation measuring 2.5 cm. No dissection identified. Celiac: Patent without evidence of aneurysm, dissection, vasculitis or significant stenosis. SMA: Patent without evidence of aneurysm, dissection, vasculitis or significant stenosis. Renals: Both renal arteries are patent without evidence of aneurysm, dissection, vasculitis, fibromuscular dysplasia or significant stenosis. IMA: Patent without evidence of aneurysm, dissection, vasculitis or significant stenosis. Inflow: Patent without evidence of aneurysm, dissection, vasculitis or significant stenosis. Veins: Not well assessed due to timing of contrast. Review of the MIP images confirms the above findings. NON-VASCULAR Hepatobiliary: No focal liver abnormality is seen. No gallstones, gallbladder wall thickening, or biliary dilatation. Pancreas:  Unremarkable. No pancreatic ductal dilatation or surrounding inflammatory changes. Spleen: Normal in size without focal abnormality. Adrenals/Urinary Tract: Stable 12 mm right adrenal nodule. No renal abnormalities. Left adrenal gland normal. Stomach/Bowel: The stomach and small bowel are normal. Mild wall thickening associated sigmoid colon is stable and apparently chronic. Scattered diverticuli without diverticulitis. The appendix is normal in appearance. Lymphatic: No adenopathy. Reproductive: Prostate  is unremarkable. Other: Fat containing left inguinal hernia. No other additional abnormalities. Musculoskeletal: No acute or significant osseous findings. Review of the MIP images confirms the above findings. IMPRESSION: 1. No thoracic aortic aneurysm or dissection. 2. The infrarenal abdominal aortic aneurysm is larger in the interval. It measures 5 x 5 cm today versus 4.6 x 4.5 cm previously. Recommend surgical consultation and follow up as below. No associated dissection today. Recommend followup by abdomen and pelvis CTA in 3-6 months, and vascular surgery referral/consultation if not already obtained. This recommendation follows ACR consensus guidelines: White Paper of the ACR Incidental Findings Committee II on Vascular Findings. J Am Coll Radiol 2013; 10:789-794. 3. Dependent opacities in the bilateral lungs consistent with scar or atelectasis. 4. Atherosclerotic change in the aorta. 5. Stable 12 mm nodule in the right adrenal gland. Electronically Signed   By: Dorise Bullion III M.D   On: 07/31/2016 16:02   Ct Angio Abd/pel W And/or Wo Contrast  Result Date: 07/31/2016 CLINICAL DATA:  Known abdominal aortic aneurysm. Thoracic back pain for 2 days. Evaluate for dissection. EXAM: CT ANGIOGRAPHY CHEST, ABDOMEN AND PELVIS TECHNIQUE: Multidetector CT imaging through the chest, abdomen and pelvis was performed using the standard protocol during bolus administration of intravenous contrast. Multiplanar reconstructed images and MIPs were obtained and reviewed to evaluate the vascular anatomy. CONTRAST:  100 mL of Isovue 370 COMPARISON:  None. FINDINGS: CTA CHEST FINDINGS Cardiovascular: Coronary artery calcifications are identified in both the right and left coronary arteries. The heart size is unchanged without gross enlargement. No effusions. The ascending thoracic aorta measures 3.3 cm at the sino-tubular junction. The mid ascending thoracic aorta measures 2.8 cm. The arch measures 2.7 cm. The distal  descending thoracic aorta measures 2.5 cm. No aneurysm identified. No dissection is noted. There is an aberrant origin of the right subclavian artery. Other branching vessels demonstrate normal origins. Atherosclerosis is associated with the thoracic aorta and at the origin of the branching vessels. Mediastinum/Nodes: No enlarged mediastinal, hilar, or axillary lymph nodes. Thyroid gland, trachea, and esophagus demonstrate no significant findings. Lungs/Pleura: Opacity dependently in the bases is probably atelectasis or scarring. No suspicious infiltrate to suggest pneumonia. Central airways are normal. No pneumothorax. Mild emphysematous changes in the apices. No suspicious nodules, masses, or infiltrates. Musculoskeletal: The patient is status post sternotomy with intact wires. No bony abnormalities seen in the chest. Review of the MIP images confirms the above findings. CTA ABDOMEN AND PELVIS FINDINGS VASCULAR Aorta: Atherosclerotic changes seen in the abdominal aorta. The patient's known infrarenal aortic aneurysm is again identified. The maximum diameter of the aneurysm is 5 by 5 cm on series 5, image 115 today versus 4.6 x 4.5 cm previously. The abdominal aorta and narrows of normal caliber just above the bifurcation measuring 2.5 cm. No dissection identified. Celiac: Patent without evidence of aneurysm, dissection, vasculitis or significant stenosis. SMA: Patent without evidence of aneurysm, dissection, vasculitis or significant stenosis. Renals: Both renal arteries are patent without evidence of aneurysm, dissection, vasculitis, fibromuscular dysplasia or significant stenosis. IMA: Patent without evidence of aneurysm, dissection, vasculitis or significant stenosis. Inflow: Patent without  evidence of aneurysm, dissection, vasculitis or significant stenosis. Veins: Not well assessed due to timing of contrast. Review of the MIP images confirms the above findings. NON-VASCULAR Hepatobiliary: No focal liver  abnormality is seen. No gallstones, gallbladder wall thickening, or biliary dilatation. Pancreas: Unremarkable. No pancreatic ductal dilatation or surrounding inflammatory changes. Spleen: Normal in size without focal abnormality. Adrenals/Urinary Tract: Stable 12 mm right adrenal nodule. No renal abnormalities. Left adrenal gland normal. Stomach/Bowel: The stomach and small bowel are normal. Mild wall thickening associated sigmoid colon is stable and apparently chronic. Scattered diverticuli without diverticulitis. The appendix is normal in appearance. Lymphatic: No adenopathy. Reproductive: Prostate is unremarkable. Other: Fat containing left inguinal hernia. No other additional abnormalities. Musculoskeletal: No acute or significant osseous findings. Review of the MIP images confirms the above findings. IMPRESSION: 1. No thoracic aortic aneurysm or dissection. 2. The infrarenal abdominal aortic aneurysm is larger in the interval. It measures 5 x 5 cm today versus 4.6 x 4.5 cm previously. Recommend surgical consultation and follow up as below. No associated dissection today. Recommend followup by abdomen and pelvis CTA in 3-6 months, and vascular surgery referral/consultation if not already obtained. This recommendation follows ACR consensus guidelines: White Paper of the ACR Incidental Findings Committee II on Vascular Findings. J Am Coll Radiol 2013; 10:789-794. 3. Dependent opacities in the bilateral lungs consistent with scar or atelectasis. 4. Atherosclerotic change in the aorta. 5. Stable 12 mm nodule in the right adrenal gland. Electronically Signed   By: Dorise Bullion III M.D   On: 07/31/2016 16:02    Microbiology: No results found for this or any previous visit (from the past 240 hour(s)).   Labs: Basic Metabolic Panel:  Recent Labs Lab 07/31/16 1238 07/31/16 2302 08/01/16 0559  NA 137  --  135  K 4.0  --  3.6  CL 102  --  102  CO2 27  --  27  GLUCOSE 133*  --  122*  BUN 10  --  9   CREATININE 1.50* 1.19 1.08  CALCIUM 10.2  --  9.4   Liver Function Tests:  Recent Labs Lab 07/31/16 1249  AST 30  ALT 30  ALKPHOS 85  BILITOT 1.2  PROT 6.8  ALBUMIN 4.2   No results for input(s): LIPASE, AMYLASE in the last 168 hours. No results for input(s): AMMONIA in the last 168 hours. CBC:  Recent Labs Lab 07/31/16 1238 07/31/16 2302  WBC 7.6 7.3  HGB 16.0 14.5  HCT 48.6 44.4  MCV 93.6 93.9  PLT 207 184   Cardiac Enzymes:  Recent Labs Lab 07/31/16 1238 07/31/16 2302 08/01/16 0559  TROPONINI 0.04* 0.06* 0.06*   BNP: BNP (last 3 results) No results for input(s): BNP in the last 8760 hours.  ProBNP (last 3 results) No results for input(s): PROBNP in the last 8760 hours.  CBG: No results for input(s): GLUCAP in the last 168 hours.  Signed:  Velvet Bathe MD.  [facility] 08/02/2016, 10:53 AM

## 2016-08-02 NOTE — Progress Notes (Signed)
  Please see my consult note from yesterday for full recs. Increased lopressor to 37.5mg  bid, ok to take 25mg  prn palpitations at home. Telemetry overnight SR with no significant tachycarrhythmias.  No afib this admission, patient presented with MAT. Reported though unclear history of WPW, no preexcitation on EKG, has been on beta blockers for years without troubles. With MAT not same risk as afib or aflutter of severe tachycardia, we will continue. We have referred to EP to see if he needs any additional considerations. [place] for discharge today from cardiac standpoint, we will sign off inpatient care.       [name], M.D.

## 2016-08-02 NOTE — Care Management Important Message (Signed)
Important Message  Patient Details  Name: [name] MRN: [id number] Date of Birth: September 18, 1950   Medicare Important Message Given:  Yes    Sherald Barge, RN 08/02/2016, 2:47 PM

## 2016-08-02 NOTE — Care Management Note (Signed)
Case Management Note  Patient Details  Name: [name] MRN: [id number] Date of Birth: 1951/02/02  Subjective/Objective:                  Pt admitted with a-fb with RVR. Pt is from home, lives with his wife and is ind with ADL's. He has PCP, transportation and no difficulty affording medications. He has no DME needs or HH services PTA.   Action/Plan: Pt plans to return home with self care at DC. Pt discharging home today. No CM needs anticipated.   Expected Discharge Date:                  Expected Discharge Plan:  Home/Self Care  In-House Referral:  NA  Discharge planning Services  CM Consult  Post Acute Care Choice:  NA Choice offered to:  NA  Status of Service:  Completed, signed off   Sherald Barge, RN 08/02/2016, 2:51 PM

## 2016-08-05 ENCOUNTER — Encounter: Payer: Self-pay | Admitting: Vascular Surgery

## 2016-08-08 ENCOUNTER — Ambulatory Visit: Payer: Medicare Other | Admitting: Vascular Surgery

## 2016-08-11 ENCOUNTER — Ambulatory Visit (INDEPENDENT_AMBULATORY_CARE_PROVIDER_SITE_OTHER): Payer: Medicare Other | Admitting: Vascular Surgery

## 2016-08-11 ENCOUNTER — Encounter: Payer: Self-pay | Admitting: Vascular Surgery

## 2016-08-11 VITALS — BP 112/70 | HR 64 | Temp 98.4°F | Resp 18 | Ht 67.0 in | Wt 176.4 lb

## 2016-08-11 DIAGNOSIS — I714 Abdominal aortic aneurysm, without rupture, unspecified: Secondary | ICD-10-CM

## 2016-08-11 NOTE — Progress Notes (Signed)
Patient name: [name] MRN: [id number] DOB: 12-25-1950 Sex: male   HPI: [name] is a 65 y.o. male with known abdominal aortic aneurysm. He was recently in the hospital at [facility] for cardiac arrhythmia. During that time he had a CT scan of the abdomen and pelvis which shows his aneurysm is now grown to 5 cm in diameter. He presents today for further evaluation. He denies any recent abdominal or back pain. He states overall he feels well. He does have some element of COPD and has some chronic coughing. Other medical problems include hypertension, hyperlipidemia, coronary artery disease all of which are currently stable.  He is on aspirin and Plavix on a statin.  Past Medical History:  Diagnosis Date  . Abdominal aortic aneurysm ([place])   . Adrenal adenoma   . COPD (chronic obstructive pulmonary disease) ([place])   . Coronary artery disease    Multivessel, LVEF 50%, occluded SVG to RCA, DES SVG to OM system 2007  . Essential hypertension   . Hyperlipidemia   . Noncompliance   . Syncope    Acute gastroenteritis; vasovagal  . Systolic dysfunction    EF 45-50%  . Vitamin B12 deficiency anemia   . Wolff-Parkinson-White (WPW) syndrome    Failed ablation   Past Surgical History:  Procedure Laterality Date  . BLADDER SURGERY    . CORONARY ANGIOPLASTY WITH STENT PLACEMENT    . CORONARY ARTERY BYPASS GRAFT  1995   LIMA to LAD , SVG to diagonal, SVG to OM1 and OM2 , SVG to RCA     Family History  Problem Relation Age of Onset  . Varicose Veins Mother   . Heart disease Father   . Heart attack Father   . Bleeding Disorder Sister   . Peripheral vascular disease Sister     amputation  . Diabetes Brother   . Varicose Veins Brother   . Diabetes Brother   . Heart disease Brother   . Varicose Veins Brother     SOCIAL HISTORY: Social History   Social History  . Marital status: Divorced    Spouse name: N/A  . Number of children: N/A  . Years of education: N/A    Occupational History  . Not on file.   Social History Main Topics  . Smoking status: Current Every Day Smoker    Packs/day: 1.00    Years: 40.00    Types: Cigarettes    Start date: 06/03/1969  . Smokeless tobacco: Never Used     Comment: 10 ciggarettes a a day  . Alcohol use No  . Drug use: No  . Sexual activity: No   Other Topics Concern  . Not on file   Social History Narrative  . No narrative on file    Allergies  Allergen Reactions  . Codeine     Current Outpatient Prescriptions on File Prior to Visit  Medication Sig Dispense Refill  . aspirin 325 MG tablet Take 325 mg by mouth daily.      Marland Kitchen atorvastatin (LIPITOR) 80 MG tablet TAKE 1 TABLET BY MOUTH ONCE DAILY FOR CHOLESTEROL. 30 tablet 11  . clopidogrel (PLAVIX) 75 MG tablet TAKE ONE TABLET BY MOUTH ONCE DAILY. 90 tablet 3  . fenofibrate (TRICOR) 145 MG tablet TAKE 1 TABLET BY MOUTH ONCE DAILY FOR CHOLESTEROL. 30 tablet 11  . metoprolol tartrate 37.5 MG TABS Take 37.5 mg by mouth 2 (two) times daily. (Patient taking differently: Take 37.5 mg by mouth 2 (two)  times daily. Take 1 1/2 tablet daily) 60 tablet 0  . pantoprazole (PROTONIX) 40 MG tablet TAKE 1 TABLET BY MOUTH ONCE DAILY FOR ACID REFLUX. 30 tablet 3  . ramipril (ALTACE) 2.5 MG capsule TAKE (1) CAPSULE BY MOUTH ONCE DAILY. 30 capsule 11   No current facility-administered medications on file prior to visit.     ROS:   General:  No weight loss, Fever, chills  HEENT: No recent headaches, no nasal bleeding, no visual changes, no sore throat  Neurologic: No dizziness, blackouts, seizures. No recent symptoms of stroke or mini- stroke. No recent episodes of slurred speech, or temporary blindness.  Cardiac: No recent episodes of chest pain/pressure, no shortness of breath at rest.  No shortness of breath with exertion.  Denies history of atrial fibrillation or irregular heartbeat  Vascular: No history of rest pain in feet.  No history of claudication.  No  history of non-healing ulcer, No history of DVT   Pulmonary: No home oxygen, no productive cough, no hemoptysis,  No asthma or wheezing  Musculoskeletal:  [ ]  Arthritis, [ ]  Low back pain,  [X]  Joint pain  Hematologic:No history of hypercoagulable state.  No history of easy bleeding.  No history of anemia  Gastrointestinal: No hematochezia or melena,  No gastroesophageal reflux, no trouble swallowing  Urinary: [ ]  chronic Kidney disease, [ ]  on HD - [ ]  MWF or [ ]  TTHS, [ ]  Burning with urination, [ ]  Frequent urination, [ ]  Difficulty urinating;   Skin: No rashes  Psychological: No history of anxiety,  No history of depression   Physical Examination  Vitals:   08/11/16 1108  BP: 112/70  Pulse: 64  Resp: 18  Temp: 98.4 F (36.9 C)  TempSrc: Oral  SpO2: 96%  Weight: 176 lb 6.4 oz (80 kg)  Height: 5\' 7"  (1.702 m)    Body mass index is 27.63 kg/m.  General:  Alert and oriented, no acute distress HEENT: Normal Neck: No bruit or JVD Pulmonary: Clear to auscultation bilaterally Cardiac: Regular Rate and Rhythm without murmur Abdomen: Soft, non-tender, non-distended, no mass, no scars Skin: No rash Extremity Pulses:  2+ radial, brachial, femoral, dorsalis pedis, posterior tibial pulses bilaterally Musculoskeletal: No deformity or edema  Neurologic: Upper and lower extremity motor 5/5 and symmetric  DATA:  I reviewed the patient's CT scan of the abdomen and pelvis from last week. This shows no thoracic aneurysm component. He has an infrarenal abdominal aortic aneurysm with about a 2 cm long neck and 12 mm diameter external iliac arteries. This appears to be amenable to percutaneous stent graft repair. Largest aneurysm diameter was 5 cm.  ASSESSMENT:  Asymptomatic 5 cm infrarenal abdominal aortic aneurysm. I discussed with the patient today the possibility of stent graft repair or open repair of this. Also discussed the procedure details risks options benefits of one versus  the other technique. Also discussed with him continued observation until he aneurysm reaches 5-1/2 cm in diameter. I informed him that current risk of aneurysm rupture is less than 0.5% per year.   PLAN:  The patient currently wishes to think for a few days about whether or not he wishes to go forward with repair. He will call us if he wishes to have a aneurysm repaired. Otherwise we will see him back with a repeat ultrasound in 6 months time. He knows to go the emergency room if he develops severe back or abdominal pain.  Ruta Hinds, MD [facility] Office: [phone] Pager: [phone]

## 2016-08-12 ENCOUNTER — Ambulatory Visit (INDEPENDENT_AMBULATORY_CARE_PROVIDER_SITE_OTHER): Payer: Medicare Other | Admitting: Adult Health

## 2016-08-12 ENCOUNTER — Encounter: Payer: Self-pay | Admitting: Adult Health

## 2016-08-12 VITALS — BP 110/68 | HR 71 | Ht 67.5 in | Wt 174.0 lb

## 2016-08-12 DIAGNOSIS — I714 Abdominal aortic aneurysm, without rupture, unspecified: Secondary | ICD-10-CM

## 2016-08-12 DIAGNOSIS — I2510 Atherosclerotic heart disease of native coronary artery without angina pectoris: Secondary | ICD-10-CM | POA: Diagnosis not present

## 2016-08-12 DIAGNOSIS — I10 Essential (primary) hypertension: Secondary | ICD-10-CM | POA: Diagnosis not present

## 2016-08-12 DIAGNOSIS — I456 Pre-excitation syndrome: Secondary | ICD-10-CM | POA: Diagnosis not present

## 2016-08-12 NOTE — Progress Notes (Signed)
Cardiology Office Note   Date:  08/12/2016   ID:  [name], DOB 09-27-1950, MRN [id number]  PCP:  [name], MD  Cardiologist: [place]/  Jory Sims, NP   No chief complaint on file.  History of Present Illness: [name] is a 65 y.o. male who presents for ongoing assessment and management of tachycardia with MAT, hx of WPW, and AAA. He was seen by Dr. Harl Bowie during consultation for tachycardia while hospitalized for chest pain and shortness of breath. He was continued on  metoprolol  & aspirin and could increase his metoprolol to  25 mg if necessary for palpitations.   Since leaving the hospital he is followed up with Dr. Oneida Alar, for ongoing evaluation of AAA. A follow-up CT scan was completed which revealed a aortic aneurysm 5 x 5 cm, which is increased from 4.6 x 4.5 cm previously. As result of increasing size of AAA, Dr. Oneida Alar would like him to undergo surgery for repair. The patient states he wanted to think about it and now has decided to before. He has not notified Dr. Oneida Alar of his decision yet. He was told by Dr. feels that he would need a stress test for preoperative evaluation.  The patient is also scheduled to be seen by Dr. Crissie Sickles on 08/30/2016, for evaluation for WPW and MAT. This was set up prior to his discharge from [facility] when he was seen by Dr. Harl Bowie.  The patient comes today without any complaints of rapid heart rate dizziness chest pain or dyspnea. He is not needed to take any extra doses of metoprolol.  Past Medical History:  Diagnosis Date  . Abdominal aortic aneurysm ([place])   . Adrenal adenoma   . COPD (chronic obstructive pulmonary disease) ([place])   . Coronary artery disease    Multivessel, LVEF 50%, occluded SVG to RCA, DES SVG to OM system 2007  . Essential hypertension   . Hyperlipidemia   . Noncompliance   . Syncope    Acute gastroenteritis; vasovagal  . Systolic dysfunction    EF 45-50%  . Vitamin B12  deficiency anemia   . Wolff-Parkinson-White (WPW) syndrome    Failed ablation    Past Surgical History:  Procedure Laterality Date  . BLADDER SURGERY    . CORONARY ANGIOPLASTY WITH STENT PLACEMENT    . CORONARY ARTERY BYPASS GRAFT  1995   LIMA to LAD , SVG to diagonal, SVG to OM1 and OM2 , SVG to RCA      Current Outpatient Prescriptions  Medication Sig Dispense Refill  . aspirin 325 MG tablet Take 325 mg by mouth daily.      Marland Kitchen atorvastatin (LIPITOR) 80 MG tablet TAKE 1 TABLET BY MOUTH ONCE DAILY FOR CHOLESTEROL. 30 tablet 11  . clopidogrel (PLAVIX) 75 MG tablet TAKE ONE TABLET BY MOUTH ONCE DAILY. 90 tablet 3  . fenofibrate (TRICOR) 145 MG tablet TAKE 1 TABLET BY MOUTH ONCE DAILY FOR CHOLESTEROL. 30 tablet 11  . metoprolol tartrate 37.5 MG TABS Take 37.5 mg by mouth 2 (two) times daily. (Patient taking differently: Take 37.5 mg by mouth 2 (two) times daily. Take 1 1/2 tablet daily) 60 tablet 0  . pantoprazole (PROTONIX) 40 MG tablet TAKE 1 TABLET BY MOUTH ONCE DAILY FOR ACID REFLUX. 30 tablet 3  . ramipril (ALTACE) 2.5 MG capsule TAKE (1) CAPSULE BY MOUTH ONCE DAILY. 30 capsule 11   No current facility-administered medications for this visit.     Allergies:  Codeine    Social History:  The patient  reports that he has been smoking Cigarettes.  He started smoking about 47 years ago. He has a 40.00 pack-year smoking history. He has never used smokeless tobacco. He reports that he does not drink alcohol or use drugs.   Family History:  The patient's family history includes Bleeding Disorder in his sister; Diabetes in his brother and brother; Heart attack in his father; Heart disease in his brother and father; Peripheral vascular disease in his sister; Varicose Veins in his brother, brother, and mother.    ROS: All other systems are reviewed and negative. Unless otherwise mentioned in H&P    PHYSICAL EXAM: VS:  BP 110/68   Pulse 71   Ht 5' 7.5" (1.715 m)   Wt 174 lb (78.9 kg)    SpO2 97%   BMI 26.85 kg/m  , BMI Body mass index is 26.85 kg/m. GEN: Well nourished, well developed, in no acute distress  HEENT: normal  Neck: no JVD, carotid bruits, or masses Cardiac: RRR; no murmurs, rubs, or gallops,no edema  Respiratory:  clear to auscultation bilaterally, normal work of breathing GI: soft, nontender, nondistended, + BS MS: no deformity or atrophy  Skin: warm and dry, no rash Neuro:  Strength and sensation are intact Psych: euthymic mood, full affect  Recent Labs: 07/31/2016: ALT 30; Hemoglobin 14.5; Platelets 184; TSH 1.243 08/01/2016: BUN 9; Creatinine, Ser 1.08; Potassium 3.6; Sodium 135    Lipid Panel    Component Value Date/Time   CHOL 111 07/14/2012 0615   TRIG 99 07/14/2012 0615   HDL 26 (L) 07/14/2012 0615   CHOLHDL 4.3 07/14/2012 0615   VLDL 20 07/14/2012 0615   LDLCALC 65 07/14/2012 0615      Wt Readings from Last 3 Encounters:  08/12/16 174 lb (78.9 kg)  08/11/16 176 lb 6.4 oz (80 kg)  08/02/16 176 lb 12.9 oz (80.2 kg)      Other studies Reviewed: Additional studies/ records that were reviewed today include:   1.Echocardiogram 06/28/2016 Left ventricle: Mid inferior and inferobasal wall hypokinesis The cavity size was normal. Wall thickness was increased in a pattern of mild LVH. Systolic function was mildly reduced. The estimated ejection fraction was in the range of 45% to 50%. - Mitral valve: Mild regurgitation. - Left atrium: The atrium was mildly dilated. - Atrial septum: No defect or patent foramen ovale was identified. - Pericardium, extracardiac: A trivial pericardial effusion was identified posterior to the heart. - Impressions: EF stable and consistant with old myovue showed EF in 45% range and inferior scar  2. Cardiac Cath 01/04/2011 CONCLUSION:  Severe native 3-vessel coronary artery disease.  Occluded right  coronary artery, patent LIMA and high-grade disease in the saphenous vein  graft to the  circumflex   IMPRESSION/PLAN:  Successful placement of two drug-eluting stents in the  saphenous vein graft to the marginal.  This reduced the stenosis from 99% to  0%.  Flow in both marginals is maintained.  Due to the overlapping nature of  the two stents, he should be maintained on aspirin and Plavix indefinitely  2. CABG 1995   LIMA to LAD, SVG to Diagonal, SVG to OM1 and OM 2 and SVG to RCA.    ASSESSMENT AND PLAN:  1.  CAD: History of coronary artery bypass grafting 1995, with placement of 2 drug-eluting stents in the saphenous vein graft to the marginal 2007.. The patient has not had a hollow up stress test since prior  to stent placement. He is to undergo possible AAA repair the first of the year 2018, he states his vascular surgeon, Dr. Oneida Alar, requested that he have a stress test. I have offered to go ahead and set that up for him, to assist in expediting plans for surgery. The patient wishes to wait until he speaks with the vascular surgeon before planning stress test. I have told him to call us to have this scheduled if he would like to have it completed before cardiology follow up with Dr. [name] in one month.   2. WPW: During recent hospitalization follow-up with electrophysiology, Dr. Lovena Le, was made. He is due to see him on 08/30/2016 and [place]. He wishes to discuss his diagnosis with Dr. Lovena Le before agreeing to have surgery. Continue metoprolol as directed.   3. AAA: .Recent CT scan demonstrated increased diameter to 5-5.5 cm. He is to follow up for ongoing plans to schedule surgery.   4. Hypertension: Currently well controlled. No changes in medication regimen.   I have spent greater than 25 minutes going over patients history and discussing his diagnosis.   Current medicin recent CT scanR demonstrated increased diameter ecnes are reviewed at length with the patient today.    Labs/ tests ordered today include:  No orders of the defined types were placed in this  encounter.    Disposition:   FU with Dr. [name] one month.   Signed, Jory Sims, NP  08/12/2016 3:55 PM    [place] [address], [place], [place] [address] Phone: [phone]; Fax: [phone]

## 2016-08-12 NOTE — Progress Notes (Signed)
Name: [name]    DOB: 01/04/1951  Age: 65 y.o.  MR#: [id number]       PCP:  [name], MD      Insurance: Payor: Onnie Boer MEDICARE / Plan: [facility] MEDICARE / Product Type: *No Product type* /   CC:   No chief complaint on file.   VS Vitals:   08/12/16 1522  Pulse: 71  SpO2: 97%  Weight: 174 lb (78.9 kg)  Height: 5' 7.5" (1.715 m)    Weights Current Weight  08/12/16 174 lb (78.9 kg)  08/11/16 176 lb 6.4 oz (80 kg)  08/02/16 176 lb 12.9 oz (80.2 kg)    Blood Pressure  BP Readings from Last 3 Encounters:  08/11/16 112/70  08/02/16 124/69  06/03/16 108/64     Admit date:  (Not on file) Last encounter with RMR:  Visit date not found   Allergy Codeine  Current Outpatient Prescriptions  Medication Sig Dispense Refill  . aspirin 325 MG tablet Take 325 mg by mouth daily.      Marland Kitchen atorvastatin (LIPITOR) 80 MG tablet TAKE 1 TABLET BY MOUTH ONCE DAILY FOR CHOLESTEROL. 30 tablet 11  . clopidogrel (PLAVIX) 75 MG tablet TAKE ONE TABLET BY MOUTH ONCE DAILY. 90 tablet 3  . fenofibrate (TRICOR) 145 MG tablet TAKE 1 TABLET BY MOUTH ONCE DAILY FOR CHOLESTEROL. 30 tablet 11  . metoprolol tartrate 37.5 MG TABS Take 37.5 mg by mouth 2 (two) times daily. (Patient taking differently: Take 37.5 mg by mouth 2 (two) times daily. Take 1 1/2 tablet daily) 60 tablet 0  . pantoprazole (PROTONIX) 40 MG tablet TAKE 1 TABLET BY MOUTH ONCE DAILY FOR ACID REFLUX. 30 tablet 3  . ramipril (ALTACE) 2.5 MG capsule TAKE (1) CAPSULE BY MOUTH ONCE DAILY. 30 capsule 11   No current facility-administered medications for this visit.     Discontinued Meds:   There are no discontinued medications.  Patient Active Problem List   Diagnosis Date Noted  . Tachyarrhythmia 07/31/2016  . Atrial fibrillation with RVR ([place]) 07/31/2016  . History of Wolff-Parkinson-White (WPW) syndrome 07/31/2016  . Chest tightness 07/31/2016  . Smoker 07/31/2016  . Vitamin B12 deficiency anemia 07/15/2012  .  Sigmoid diverticulitis 07/13/2012  . Perforation of sigmoid colon ([place]) 07/13/2012  . Abdominal aortic aneurysm ([place]) 07/13/2012  . Adrenal adenoma 07/13/2012  . Vasovagal syncope 06/28/2012  . Hematochezia 12/03/2010  . Essential hypertension, benign 02/19/2010  . History CABG x 7 1997 02/19/2010  . HYPERLIPIDEMIA 09/10/2009  . TOBACCO ABUSE 09/10/2009  . WOLFF (WOLFE)-PARKINSON-WHITE (WPW) SYNDROME 09/10/2009  . GERD 09/10/2009    LABS    Component Value Date/Time   NA 135 08/01/2016 0559   NA 137 07/31/2016 1238   NA 137 07/15/2012 0604   K 3.6 08/01/2016 0559   K 4.0 07/31/2016 1238   K 3.7 07/15/2012 0604   CL 102 08/01/2016 0559   CL 102 07/31/2016 1238   CL 100 07/15/2012 0604   CO2 27 08/01/2016 0559   CO2 27 07/31/2016 1238   CO2 28 07/15/2012 0604   GLUCOSE 122 (H) 08/01/2016 0559   GLUCOSE 133 (H) 07/31/2016 1238   GLUCOSE 118 (H) 07/15/2012 0604   BUN 9 08/01/2016 0559   BUN 10 07/31/2016 1238   BUN 8 02/20/2015 1016   CREATININE 1.08 08/01/2016 0559   CREATININE 1.19 07/31/2016 2302   CREATININE 1.50 (H) 07/31/2016 1238   CREATININE 1.02 02/20/2015 1016   CALCIUM 9.4 08/01/2016 0559  CALCIUM 10.2 07/31/2016 1238   CALCIUM 9.8 07/15/2012 0604   GFRNONAA >60 08/01/2016 0559   GFRNONAA >60 07/31/2016 2302   GFRNONAA 48 (L) 07/31/2016 1238   GFRAA >60 08/01/2016 0559   GFRAA >60 07/31/2016 2302   GFRAA 55 (L) 07/31/2016 1238   CMP     Component Value Date/Time   NA 135 08/01/2016 0559   K 3.6 08/01/2016 0559   CL 102 08/01/2016 0559   CO2 27 08/01/2016 0559   GLUCOSE 122 (H) 08/01/2016 0559   BUN 9 08/01/2016 0559   CREATININE 1.08 08/01/2016 0559   CREATININE 1.02 02/20/2015 1016   CALCIUM 9.4 08/01/2016 0559   PROT 6.8 07/31/2016 1249   ALBUMIN 4.2 07/31/2016 1249   AST 30 07/31/2016 1249   ALT 30 07/31/2016 1249   ALKPHOS 85 07/31/2016 1249   BILITOT 1.2 07/31/2016 1249   GFRNONAA >60 08/01/2016 0559   GFRAA >60 08/01/2016 0559        Component Value Date/Time   WBC 7.3 07/31/2016 2302   WBC 7.6 07/31/2016 1238   WBC 8.2 07/14/2012 0615   HGB 14.5 07/31/2016 2302   HGB 16.0 07/31/2016 1238   HGB 11.7 (L) 07/14/2012 0615   HCT 44.4 07/31/2016 2302   HCT 48.6 07/31/2016 1238   HCT 35.6 (L) 07/14/2012 0615   MCV 93.9 07/31/2016 2302   MCV 93.6 07/31/2016 1238   MCV 102.0 (H) 07/14/2012 0615    Lipid Panel     Component Value Date/Time   CHOL 111 07/14/2012 0615   TRIG 99 07/14/2012 0615   HDL 26 (L) 07/14/2012 0615   CHOLHDL 4.3 07/14/2012 0615   VLDL 20 07/14/2012 0615   LDLCALC 65 07/14/2012 0615    ABG No results found for: PHART, PCO2ART, PO2ART, HCO3, TCO2, ACIDBASEDEF, O2SAT   Lab Results  Component Value Date   TSH 1.243 07/31/2016   BNP (last 3 results) No results for input(s): BNP in the last 8760 hours.  ProBNP (last 3 results) No results for input(s): PROBNP in the last 8760 hours.  Cardiac Panel (last 3 results) No results for input(s): CKTOTAL, CKMB, TROPONINI, RELINDX in the last 72 hours.  Iron/TIBC/Ferritin/ %Sat No results found for: IRON, TIBC, FERRITIN, IRONPCTSAT   EKG Orders placed or performed during the hospital encounter of 07/31/16  . EKG 12-Lead  . EKG 12-Lead  . ED EKG within 10 minutes  . ED EKG within 10 minutes  . ED EKG  . EKG 12-Lead  . EKG 12-Lead  . EKG     Prior Assessment and Plan Problem List as of 08/12/2016 Reviewed: 07/31/2016  7:24 PM by Sol Blazing, MD     Cardiovascular and Mediastinum   Essential hypertension, benign   Last Assessment & Plan 04/24/2014 Office Visit Written 04/24/2014 10:33 AM by Satira Sark, MD    No change in antihypertensive regimen.      History CABG x 7 1997   Last Assessment & Plan 04/24/2014 Office Visit Written 04/24/2014 10:32 AM by Satira Sark, MD    Symptomatically stable on medical therapy. ECG reviewed. No changes made to current regimen.      WOLFF (WOLFE)-PARKINSON-WHITE (WPW) SYNDROME   Last  Assessment & Plan 11/04/2011 Office Visit Written 11/04/2011  2:57 PM by Satira Sark, MD    Quiescent at this time.      Vasovagal syncope   Abdominal aortic aneurysm [facility])   Last Assessment & Plan 04/24/2014 Office Visit Written 04/24/2014 10:33 AM  by Satira Sark, MD    Asymptomatic 4 cm infrarenal abdominal aortic aneurysm by CT in November 2013. Followup ultrasound be obtained for surveillance.      Tachyarrhythmia   Atrial fibrillation with RVR [facility])     Digestive   GERD   Sigmoid diverticulitis   Perforation of sigmoid colon [facility])     Endocrine   Adrenal adenoma     Other   HYPERLIPIDEMIA   Last Assessment & Plan 04/24/2014 Office Visit Written 04/24/2014 10:33 AM by Satira Sark, MD    Will request most recent lipid panel.      TOBACCO ABUSE   Last Assessment & Plan 11/04/2011 Office Visit Written 11/04/2011  2:56 PM by Satira Sark, MD    I continue to address smoking cessation with him.      Hematochezia   Last Assessment & Plan 12/03/2010 Office Visit Written 12/03/2010  1:43 PM by Satira Sark, MD    Per patient report. I explained that this needs to be looked into further, and that he should followup with his primary care physician soon. He states he has never had a colonoscopy. He is on aspirin and Plavix for his cardiovascular disease. He states that he would go and see Dr. Orson Ape for followup.      Vitamin B12 deficiency anemia   History of Wolff-Parkinson-White (WPW) syndrome   Chest tightness   Smoker       Imaging: Dg Chest Port 1 View  Result Date: 07/31/2016 CLINICAL DATA:  Chest pain. EXAM: PORTABLE CHEST 1 VIEW COMPARISON:  June 03, 2016 FINDINGS: Mild atelectasis in the left lung base. The cardiomediastinal silhouette is stable. No overt edema or focal infiltrate. IMPRESSION: No active disease. Electronically Signed   By: Dorise Bullion III M.D   On: 07/31/2016 13:18   Ct Angio Chest Aorta W And/or Wo Contrast  Result Date:  07/31/2016 CLINICAL DATA:  Known abdominal aortic aneurysm. Thoracic back pain for 2 days. Evaluate for dissection. EXAM: CT ANGIOGRAPHY CHEST, ABDOMEN AND PELVIS TECHNIQUE: Multidetector CT imaging through the chest, abdomen and pelvis was performed using the standard protocol during bolus administration of intravenous contrast. Multiplanar reconstructed images and MIPs were obtained and reviewed to evaluate the vascular anatomy. CONTRAST:  100 mL of Isovue 370 COMPARISON:  None. FINDINGS: CTA CHEST FINDINGS Cardiovascular: Coronary artery calcifications are identified in both the right and left coronary arteries. The heart size is unchanged without gross enlargement. No effusions. The ascending thoracic aorta measures 3.3 cm at the sino-tubular junction. The mid ascending thoracic aorta measures 2.8 cm. The arch measures 2.7 cm. The distal descending thoracic aorta measures 2.5 cm. No aneurysm identified. No dissection is noted. There is an aberrant origin of the right subclavian artery. Other branching vessels demonstrate normal origins. Atherosclerosis is associated with the thoracic aorta and at the origin of the branching vessels. Mediastinum/Nodes: No enlarged mediastinal, hilar, or axillary lymph nodes. Thyroid gland, trachea, and esophagus demonstrate no significant findings. Lungs/Pleura: Opacity dependently in the bases is probably atelectasis or scarring. No suspicious infiltrate to suggest pneumonia. Central airways are normal. No pneumothorax. Mild emphysematous changes in the apices. No suspicious nodules, masses, or infiltrates. Musculoskeletal: The patient is status post sternotomy with intact wires. No bony abnormalities seen in the chest. Review of the MIP images confirms the above findings. CTA ABDOMEN AND PELVIS FINDINGS VASCULAR Aorta: Atherosclerotic changes seen in the abdominal aorta. The patient's known infrarenal aortic aneurysm is again identified. The maximum diameter  of the aneurysm is  5 by 5 cm on series 5, image 115 today versus 4.6 x 4.5 cm previously. The abdominal aorta and narrows of normal caliber just above the bifurcation measuring 2.5 cm. No dissection identified. Celiac: Patent without evidence of aneurysm, dissection, vasculitis or significant stenosis. SMA: Patent without evidence of aneurysm, dissection, vasculitis or significant stenosis. Renals: Both renal arteries are patent without evidence of aneurysm, dissection, vasculitis, fibromuscular dysplasia or significant stenosis. IMA: Patent without evidence of aneurysm, dissection, vasculitis or significant stenosis. Inflow: Patent without evidence of aneurysm, dissection, vasculitis or significant stenosis. Veins: Not well assessed due to timing of contrast. Review of the MIP images confirms the above findings. NON-VASCULAR Hepatobiliary: No focal liver abnormality is seen. No gallstones, gallbladder wall thickening, or biliary dilatation. Pancreas: Unremarkable. No pancreatic ductal dilatation or surrounding inflammatory changes. Spleen: Normal in size without focal abnormality. Adrenals/Urinary Tract: Stable 12 mm right adrenal nodule. No renal abnormalities. Left adrenal gland normal. Stomach/Bowel: The stomach and small bowel are normal. Mild wall thickening associated sigmoid colon is stable and apparently chronic. Scattered diverticuli without diverticulitis. The appendix is normal in appearance. Lymphatic: No adenopathy. Reproductive: Prostate is unremarkable. Other: Fat containing left inguinal hernia. No other additional abnormalities. Musculoskeletal: No acute or significant osseous findings. Review of the MIP images confirms the above findings. IMPRESSION: 1. No thoracic aortic aneurysm or dissection. 2. The infrarenal abdominal aortic aneurysm is larger in the interval. It measures 5 x 5 cm today versus 4.6 x 4.5 cm previously. Recommend surgical consultation and follow up as below. No associated dissection today.  Recommend followup by abdomen and pelvis CTA in 3-6 months, and vascular surgery referral/consultation if not already obtained. This recommendation follows ACR consensus guidelines: White Paper of the ACR Incidental Findings Committee II on Vascular Findings. J Am Coll Radiol 2013; 10:789-794. 3. Dependent opacities in the bilateral lungs consistent with scar or atelectasis. 4. Atherosclerotic change in the aorta. 5. Stable 12 mm nodule in the right adrenal gland. Electronically Signed   By: Dorise Bullion III M.D   On: 07/31/2016 16:02   Ct Angio Abd/pel W And/or Wo Contrast  Result Date: 07/31/2016 CLINICAL DATA:  Known abdominal aortic aneurysm. Thoracic back pain for 2 days. Evaluate for dissection. EXAM: CT ANGIOGRAPHY CHEST, ABDOMEN AND PELVIS TECHNIQUE: Multidetector CT imaging through the chest, abdomen and pelvis was performed using the standard protocol during bolus administration of intravenous contrast. Multiplanar reconstructed images and MIPs were obtained and reviewed to evaluate the vascular anatomy. CONTRAST:  100 mL of Isovue 370 COMPARISON:  None. FINDINGS: CTA CHEST FINDINGS Cardiovascular: Coronary artery calcifications are identified in both the right and left coronary arteries. The heart size is unchanged without gross enlargement. No effusions. The ascending thoracic aorta measures 3.3 cm at the sino-tubular junction. The mid ascending thoracic aorta measures 2.8 cm. The arch measures 2.7 cm. The distal descending thoracic aorta measures 2.5 cm. No aneurysm identified. No dissection is noted. There is an aberrant origin of the right subclavian artery. Other branching vessels demonstrate normal origins. Atherosclerosis is associated with the thoracic aorta and at the origin of the branching vessels. Mediastinum/Nodes: No enlarged mediastinal, hilar, or axillary lymph nodes. Thyroid gland, trachea, and esophagus demonstrate no significant findings. Lungs/Pleura: Opacity dependently in the  bases is probably atelectasis or scarring. No suspicious infiltrate to suggest pneumonia. Central airways are normal. No pneumothorax. Mild emphysematous changes in the apices. No suspicious nodules, masses, or infiltrates. Musculoskeletal: The patient is status post sternotomy with  intact wires. No bony abnormalities seen in the chest. Review of the MIP images confirms the above findings. CTA ABDOMEN AND PELVIS FINDINGS VASCULAR Aorta: Atherosclerotic changes seen in the abdominal aorta. The patient's known infrarenal aortic aneurysm is again identified. The maximum diameter of the aneurysm is 5 by 5 cm on series 5, image 115 today versus 4.6 x 4.5 cm previously. The abdominal aorta and narrows of normal caliber just above the bifurcation measuring 2.5 cm. No dissection identified. Celiac: Patent without evidence of aneurysm, dissection, vasculitis or significant stenosis. SMA: Patent without evidence of aneurysm, dissection, vasculitis or significant stenosis. Renals: Both renal arteries are patent without evidence of aneurysm, dissection, vasculitis, fibromuscular dysplasia or significant stenosis. IMA: Patent without evidence of aneurysm, dissection, vasculitis or significant stenosis. Inflow: Patent without evidence of aneurysm, dissection, vasculitis or significant stenosis. Veins: Not well assessed due to timing of contrast. Review of the MIP images confirms the above findings. NON-VASCULAR Hepatobiliary: No focal liver abnormality is seen. No gallstones, gallbladder wall thickening, or biliary dilatation. Pancreas: Unremarkable. No pancreatic ductal dilatation or surrounding inflammatory changes. Spleen: Normal in size without focal abnormality. Adrenals/Urinary Tract: Stable 12 mm right adrenal nodule. No renal abnormalities. Left adrenal gland normal. Stomach/Bowel: The stomach and small bowel are normal. Mild wall thickening associated sigmoid colon is stable and apparently chronic. Scattered diverticuli  without diverticulitis. The appendix is normal in appearance. Lymphatic: No adenopathy. Reproductive: Prostate is unremarkable. Other: Fat containing left inguinal hernia. No other additional abnormalities. Musculoskeletal: No acute or significant osseous findings. Review of the MIP images confirms the above findings. IMPRESSION: 1. No thoracic aortic aneurysm or dissection. 2. The infrarenal abdominal aortic aneurysm is larger in the interval. It measures 5 x 5 cm today versus 4.6 x 4.5 cm previously. Recommend surgical consultation and follow up as below. No associated dissection today. Recommend followup by abdomen and pelvis CTA in 3-6 months, and vascular surgery referral/consultation if not already obtained. This recommendation follows ACR consensus guidelines: White Paper of the ACR Incidental Findings Committee II on Vascular Findings. J Am Coll Radiol 2013; 10:789-794. 3. Dependent opacities in the bilateral lungs consistent with scar or atelectasis. 4. Atherosclerotic change in the aorta. 5. Stable 12 mm nodule in the right adrenal gland. Electronically Signed   By: Dorise Bullion III M.D   On: 07/31/2016 16:02

## 2016-08-12 NOTE — Patient Instructions (Signed)
Medication Instructions:  Your physician recommends that you continue on your current medications as directed. Please refer to the Current Medication list given to you today.   Labwork: NONE  Testing/Procedures: NONE  Follow-Up: Your physician recommends that you schedule a follow-up appointment in: 1 MONTH   Any Other Special Instructions Will Be Listed Below (If Applicable).     If you need a refill on your cardiac medications before your next appointment, please call your pharmacy.   

## 2016-08-23 ENCOUNTER — Other Ambulatory Visit: Payer: Self-pay | Admitting: Cardiology

## 2016-08-30 ENCOUNTER — Ambulatory Visit (INDEPENDENT_AMBULATORY_CARE_PROVIDER_SITE_OTHER): Payer: Medicare Other | Admitting: Internal Medicine

## 2016-08-30 ENCOUNTER — Encounter: Payer: Self-pay | Admitting: Internal Medicine

## 2016-08-30 VITALS — BP 112/80 | HR 66 | Ht 68.0 in | Wt 175.6 lb

## 2016-08-30 DIAGNOSIS — I456 Pre-excitation syndrome: Secondary | ICD-10-CM | POA: Diagnosis not present

## 2016-08-30 DIAGNOSIS — I471 Supraventricular tachycardia: Secondary | ICD-10-CM

## 2016-08-30 NOTE — Progress Notes (Signed)
HPI [name] is referred today by Dr. Harl Bowie for evaluation of SVT and WPW syndrome. He is a pleasant 66 yo man with a h/o CAD, s/p CABG, coronary stenting, and palpitations dating back to childhood. He was diagnosed with WPW in 1995 and had an attempted ablation. It is unclear what the details are but he states it was unsuccessful. He describes burns on his body after. He has had palpitations since but over the last year his symptoms have changed. When his heart races it is not as fast. An ecg from a few weeks ago suggests atrial fib with no pre-excitation and his 12 lead demonstrated no clear cut ventricular pre-excitation.  Allergies  Allergen Reactions  . Codeine      Current Outpatient Prescriptions  Medication Sig Dispense Refill  . aspirin 325 MG tablet Take 325 mg by mouth daily.      Marland Kitchen atorvastatin (LIPITOR) 80 MG tablet TAKE 1 TABLET BY MOUTH ONCE DAILY FOR CHOLESTEROL. 30 tablet 11  . clopidogrel (PLAVIX) 75 MG tablet TAKE ONE TABLET BY MOUTH ONCE DAILY. 90 tablet 3  . fenofibrate (TRICOR) 145 MG tablet TAKE 1 TABLET BY MOUTH ONCE DAILY FOR CHOLESTEROL. 30 tablet 11  . metoprolol tartrate (LOPRESSOR) 25 MG tablet Take 37.5 mg by mouth 2 (two) times daily.    . Multiple Vitamin (ONE-A-DAY MENS PO) Take 1 tablet by mouth daily.    . pantoprazole (PROTONIX) 40 MG tablet TAKE 1 TABLET BY MOUTH ONCE DAILY FOR ACID REFLUX. 30 tablet 0  . ramipril (ALTACE) 2.5 MG capsule TAKE (1) CAPSULE BY MOUTH ONCE DAILY. 30 capsule 11   No current facility-administered medications for this visit.      Past Medical History:  Diagnosis Date  . Abdominal aortic aneurysm ([place])   . Adrenal adenoma   . COPD (chronic obstructive pulmonary disease) ([place])   . Coronary artery disease    Multivessel, LVEF 50%, occluded SVG to RCA, DES SVG to OM system 2007  . Essential hypertension   . Hyperlipidemia   . Noncompliance   . Syncope    Acute gastroenteritis; vasovagal  . Systolic dysfunction     EF 45-50%  . Vitamin B12 deficiency anemia   . Wolff-Parkinson-White (WPW) syndrome    Failed ablation    ROS:   All systems reviewed and negative except as noted in the HPI.   Past Surgical History:  Procedure Laterality Date  . BLADDER SURGERY    . CORONARY ANGIOPLASTY WITH STENT PLACEMENT    . CORONARY ARTERY BYPASS GRAFT  1995   LIMA to LAD , SVG to diagonal, SVG to OM1 and OM2 , SVG to RCA      Family History  Problem Relation Age of Onset  . Varicose Veins Mother   . Heart disease Father   . Heart attack Father   . Bleeding Disorder Sister   . Peripheral vascular disease Sister     amputation  . Diabetes Brother   . Varicose Veins Brother   . Diabetes Brother   . Heart disease Brother   . Varicose Veins Brother      Social History   Social History  . Marital status: Divorced    Spouse name: N/A  . Number of children: N/A  . Years of education: N/A   Occupational History  . Not on file.   Social History Main Topics  . Smoking status: Current Every Day Smoker    Packs/day: 1.00  Years: 40.00    Types: Cigarettes    Start date: 06/03/1969  . Smokeless tobacco: Never Used     Comment: 10 ciggarettes a a day  . Alcohol use No  . Drug use: No  . Sexual activity: No   Other Topics Concern  . Not on file   Social History Narrative  . No narrative on file     BP 112/80 (BP Location: Left Arm)   Pulse 66   Ht 5\' 8"  (1.727 m)   Wt 175 lb 9.6 oz (79.7 kg)   BMI 26.70 kg/m   Physical Exam:  Well appearing 66 yo man, NAD HEENT: Unremarkable Neck:  6 cm JVD, no thyromegally Lymphatics:  No adenopathy Back:  No CVA tenderness Lungs:  Clear with no wheezes HEART:  Regular rate rhythm, no murmurs, no rubs, no clicks Abd:  soft, positive bowel sounds, no organomegally, no rebound, no guarding Ext:  2 plus pulses, no edema, no cyanosis, no clubbing Skin:  No rashes no nodules Neuro:  CN II through XII intact, motor grossly intact  EKG -  NSR  Assess/Plan: 1. WPW - he has no pre-excitation and I suspect his antegrade conduction has resolved. He is limited in his AA drug options and I have recommended a period of watchful waiting. 2. PAF - he is already taking plavix and ASA and I am reluctant to add a NOAC at this point but might consider stopping ASA and adding Eliquis in the future. 3. AAA - he is being followed by Dr. Oneida Alar. His aneurysm is 5 x 5 cm.  He will likely need surgical therapy soon, albeit at some risk, particularly if he requires an open procedure.  Mikle Bosworth.D.

## 2016-08-30 NOTE — Patient Instructions (Addendum)
Medication Instructions:  Your physician recommends that you continue on your current medications as directed. Please refer to the Current Medication list given to you today.   Labwork: None Ordered   Testing/Procedures: None Ordered   Follow-Up: Your physician wants you to follow-up in: 6 months with Dr. Lovena Le [facility] office). You will receive a reminder letter in the mail two months in advance. If you don't receive a letter, please call our office to schedule the follow-up appointment.    Any Other Special Instructions Will Be Listed Below (If Applicable).     If you need a refill on your cardiac medications before your next appointment, please call your pharmacy.

## 2016-09-16 ENCOUNTER — Other Ambulatory Visit: Payer: Self-pay | Admitting: Cardiology

## 2016-09-20 ENCOUNTER — Ambulatory Visit: Payer: Medicare Other | Admitting: Cardiology

## 2016-09-22 ENCOUNTER — Other Ambulatory Visit: Payer: Self-pay | Admitting: Cardiology

## 2016-09-22 MED ORDER — PANTOPRAZOLE SODIUM 40 MG PO TBEC: DELAYED_RELEASE_TABLET | ORAL | 6 refills | Status: DC

## 2016-09-22 MED ORDER — METOPROLOL TARTRATE 25 MG PO TABS: ORAL_TABLET | ORAL | 6 refills | Status: DC

## 2016-09-22 NOTE — Telephone Encounter (Signed)
Patient has questions regarding refills. / tg

## 2016-09-22 NOTE — Telephone Encounter (Signed)
Refilled protonix and lopressor to [facility]

## 2016-10-21 ENCOUNTER — Encounter: Payer: Self-pay | Admitting: Vascular Surgery

## 2016-10-27 ENCOUNTER — Other Ambulatory Visit (HOSPITAL_COMMUNITY): Payer: Medicare Other

## 2016-10-27 ENCOUNTER — Ambulatory Visit: Payer: Medicare Other | Admitting: Family

## 2016-11-03 ENCOUNTER — Ambulatory Visit: Payer: Medicare Other | Admitting: Vascular Surgery

## 2016-11-11 ENCOUNTER — Encounter: Payer: Self-pay | Admitting: Vascular Surgery

## 2016-11-17 ENCOUNTER — Encounter: Payer: Self-pay | Admitting: Vascular Surgery

## 2016-11-17 ENCOUNTER — Ambulatory Visit (INDEPENDENT_AMBULATORY_CARE_PROVIDER_SITE_OTHER): Payer: Medicare Other | Admitting: Vascular Surgery

## 2016-11-17 VITALS — BP 116/76 | HR 60 | Resp 20 | Ht 68.0 in | Wt 168.7 lb

## 2016-11-17 DIAGNOSIS — Z01812 Encounter for preprocedural laboratory examination: Secondary | ICD-10-CM

## 2016-11-17 MED ORDER — FLUCONAZOLE 50 MG PO TABS: 200.0000 mg | ORAL_TABLET | Freq: Every day | ORAL | Status: DC

## 2016-11-17 NOTE — Progress Notes (Signed)
Patient name: [name] MRN: [id number] DOB: [birth year] Sex: male    HPI: EDER [name] is a 66 y.o. male who returns for follow-up today regarding abdominal aortic aneurysm. We have been following his aneurysm for some time. It has now grown to 5 cm in diameter. It was originally 4 cm in diameter in 2013. He denies any back or abdominal pain. He is anxious about the aneurysm and wishes to have it repaired at this point. He denies any chest pain or shortness of breath. He does have a history of Wolff-Parkinson-White syndrome which is been followed by Dr. Crissie Sickles. He is also followed by Dr. [name]. This has been asymptomatic. He is on aspirin and Plavix.  Past Medical History:  Diagnosis Date  . Abdominal aortic aneurysm ([place])   . Adrenal adenoma   . COPD (chronic obstructive pulmonary disease) ([place])   . Coronary artery disease    Multivessel, LVEF 50%, occluded SVG to RCA, DES SVG to OM system 2007  . Essential hypertension   . Hyperlipidemia   . Noncompliance   . Syncope    Acute gastroenteritis; vasovagal  . Systolic dysfunction    EF 45-50%  . Vitamin B12 deficiency anemia   . Wolff-Parkinson-White (WPW) syndrome    Failed ablation   Past Surgical History:  Procedure Laterality Date  . BLADDER SURGERY    . CORONARY ANGIOPLASTY WITH STENT PLACEMENT    . CORONARY ARTERY BYPASS GRAFT  1995   LIMA to LAD , SVG to diagonal, SVG to OM1 and OM2 , SVG to RCA     Family History  Problem Relation Age of Onset  . Varicose Veins Mother   . Heart disease Father   . Heart attack Father   . Bleeding Disorder Sister   . Peripheral vascular disease Sister     amputation  . Diabetes Brother   . Varicose Veins Brother   . Diabetes Brother   . Heart disease Brother   . Varicose Veins Brother     SOCIAL HISTORY: Social History   Social History  . Marital status: Divorced    Spouse name: N/A  . Number of children: N/A  . Years of education: N/A   Occupational  History  . Not on file.   Social History Main Topics  . Smoking status: Current Every Day Smoker    Packs/day: 1.00    Years: 40.00    Types: Cigarettes    Start date: 06/03/1969  . Smokeless tobacco: Never Used     Comment: 10 ciggarettes a a day  . Alcohol use No  . Drug use: No  . Sexual activity: No   Other Topics Concern  . Not on file   Social History Narrative  . No narrative on file    Allergies  Allergen Reactions  . Codeine     Current Outpatient Prescriptions  Medication Sig Dispense Refill  . aspirin 325 MG tablet Take 325 mg by mouth daily.      Marland Kitchen atorvastatin (LIPITOR) 80 MG tablet TAKE 1 TABLET BY MOUTH ONCE DAILY FOR CHOLESTEROL. 30 tablet 11  . clopidogrel (PLAVIX) 75 MG tablet TAKE ONE TABLET BY MOUTH ONCE DAILY. 90 tablet 3  . fenofibrate (TRICOR) 145 MG tablet TAKE 1 TABLET BY MOUTH ONCE DAILY FOR CHOLESTEROL. 30 tablet 11  . metoprolol tartrate (LOPRESSOR) 25 MG tablet TAKE 1 & 1/2 TABLETS BY MOUTH TWICE DAILY. 90 tablet 6  . Multiple Vitamin (ONE-A-DAY MENS PO) Take 1  tablet by mouth daily.    . pantoprazole (PROTONIX) 40 MG tablet TAKE 1 TABLET BY MOUTH ONCE DAILY FOR ACID REFLUX. 30 tablet 6  . ramipril (ALTACE) 2.5 MG capsule TAKE (1) CAPSULE BY MOUTH ONCE DAILY. 30 capsule 11   No current facility-administered medications for this visit.     ROS:   General:  No weight loss, Fever, chills  HEENT: No recent headaches, no nasal bleeding, no visual changes, no sore throat  Neurologic: No dizziness, blackouts, seizures. No recent symptoms of stroke or mini- stroke. No recent episodes of slurred speech, or temporary blindness.  Cardiac: No recent episodes of chest pain/pressure, no shortness of breath at rest.  No shortness of breath with exertion.  Denies history of atrial fibrillation or irregular heartbeat  Vascular: No history of rest pain in feet.  No history of claudication.  No history of non-healing ulcer, No history of DVT   Pulmonary:  No home oxygen, no productive cough, no hemoptysis,  No asthma or wheezing  Musculoskeletal:  [ ]  Arthritis, [ ]  Low back pain,  [ ]  Joint pain  Hematologic:No history of hypercoagulable state.  No history of easy bleeding.  No history of anemia  Gastrointestinal: No hematochezia or melena,  No gastroesophageal reflux, no trouble swallowing  Urinary: [ ]  chronic Kidney disease, [ ]  on HD - [ ]  MWF or [ ]  TTHS, [ ]  Burning with urination, [ ]  Frequent urination, [ ]  Difficulty urinating;   Skin: No rashes  Psychological: No history of anxiety,  No history of depression   Physical Examination  Vitals:   11/17/16 1432  BP: 116/76  Pulse: 60  Resp: 20  SpO2: 94%  Weight: 168 lb 11.2 oz (76.5 kg)  Height: 5\' 8"  (1.727 m)    Body mass index is 25.65 kg/m.  General:  Alert and oriented, no acute distress HEENT: Normal Neck: No bruit or JVD Pulmonary: Clear to auscultation bilaterally Cardiac: Regular Rate and Rhythm without murmur Abdomen: Soft, non-tender, non-distended, no mass, no scars Skin: No rash Extremity Pulses:  2+ radial, brachial, femoral, dorsalis pedis, posterior tibial pulses bilaterally Musculoskeletal: No deformity or edema  Neurologic: Upper and lower extremity motor 5/5 and symmetric  DATA: I reviewed the patient's CT scan of the abdomen and pelvis from December 2017. This shows no thoracic aneurysm component. He has an infrarenal abdominal aortic aneurysm 5 cm with about a 2 cm long neck and 12 mm diameter external iliac arteries. This appears to be amenable to percutaneous stent graft repair. Largest aneurysm diameter was 5 cm.   ASSESSMENT:  5 cm infrarenal abdominal aortic aneurysm   PLAN:  #1 we will schedule him for aortic stent graft repair of this tentatively 12/05/2016 pending cardiac workup. #2 needs cardiac stress test prior to this. We will stop his Plavix 7 days prior to his aneurysm repair.  Risks benefits possible, patient and procedure  details were discussed patient today including but not limited to bleeding infection risk of death myocardial events need for long-term follow-up with imaging 10% chance of re-interventions for stent graft repair. He understands and agrees to proceed.  Other chronic medical problems of hypertension hyperlipidemia and coronary artery disease as well as COPD are all stable.   Ruta Hinds, MD [facility] Office: [phone] Pager: [phone]

## 2016-11-23 ENCOUNTER — Telehealth: Payer: Self-pay

## 2016-11-23 DIAGNOSIS — B49 Unspecified mycosis: Secondary | ICD-10-CM

## 2016-11-23 MED ORDER — FLUCONAZOLE 200 MG PO TABS: 200.0000 mg | ORAL_TABLET | Freq: Every day | ORAL | 0 refills | Status: AC

## 2016-11-23 NOTE — Telephone Encounter (Signed)
Rx for Fluconazole 200 mg, 1 po qd x 7 days electronically sent to the pt's pharmacy. (previous prescription written on 3/29 was ordered as being clinic administered.)  Phone call to pt.  Made aware that Dr. Oneida Alar recommended an oral antifungal medication and not a topical cream.  Advised new Rx was sent to his pharmacy today.  Verb. Understanding.

## 2016-11-23 NOTE — Telephone Encounter (Signed)
-----   Message from Elam Dutch, MD sent at 11/23/2016 10:29 AM EDT ----- Regarding: RE: Topical Cream I sent him a prescription for oral fluconazole not a cream ----- Message ----- From: Denman George, RN Sent: 11/22/2016   5:46 PM To: Elam Dutch, MD Subject: Topical Cream                                  You saw this pt. last week, (3/29) for AAA.  Per the pt., you recommended a prescription for poss. ringworm; he had an area on left inner thigh, prox. to groin.  Stated that he understood, you would send an Rx  for a topical cream to his pharmacy.  What would you like me to order?  He has tried an OTC antifungal cream, but said it hasn't been effective.

## 2016-11-24 ENCOUNTER — Telehealth: Payer: Self-pay

## 2016-11-24 ENCOUNTER — Inpatient Hospital Stay (HOSPITAL_COMMUNITY): Admission: RE | Admit: 2016-11-24 | Payer: Medicare Other | Source: Ambulatory Visit

## 2016-11-24 ENCOUNTER — Other Ambulatory Visit: Payer: Self-pay

## 2016-11-24 NOTE — Telephone Encounter (Signed)
Called pt. To schedule surgery for EVAR on 12/05/16.  Stated that due to several personal concerns, he wanted to delay the procedure until latter part of May.  Discussed with Dr. Oneida Alar.  Per Dr. Oneida Alar, okay to schedule procedure for EVAR 01/18/17.  Have the pt. see me in the office approx. one week prior, to do an H & P.  Will contact the pt. with appt.

## 2016-11-24 NOTE — Telephone Encounter (Signed)
Scheduled 5/24 @ 9:30 am.

## 2016-11-28 ENCOUNTER — Other Ambulatory Visit: Payer: Self-pay | Admitting: *Deleted

## 2016-11-28 DIAGNOSIS — Z01812 Encounter for preprocedural laboratory examination: Secondary | ICD-10-CM

## 2016-11-29 ENCOUNTER — Encounter (HOSPITAL_COMMUNITY)
Admission: RE | Admit: 2016-11-29 | Discharge: 2016-11-29 | Disposition: A | Discharge status: Home / Self Care | Payer: Medicare Other | Source: Ambulatory Visit | Attending: Vascular Surgery | Admitting: Vascular Surgery

## 2016-11-29 ENCOUNTER — Encounter (HOSPITAL_COMMUNITY): Payer: Self-pay

## 2016-11-29 ENCOUNTER — Inpatient Hospital Stay (HOSPITAL_COMMUNITY): Admission: RE | Admit: 2016-11-29 | Payer: Medicare Other | Source: Ambulatory Visit

## 2016-11-29 DIAGNOSIS — I5189 Other ill-defined heart diseases: Secondary | ICD-10-CM | POA: Diagnosis not present

## 2016-11-29 DIAGNOSIS — Z01812 Encounter for preprocedural laboratory examination: Secondary | ICD-10-CM

## 2016-11-29 DIAGNOSIS — Z01818 Encounter for other preprocedural examination: Secondary | ICD-10-CM | POA: Diagnosis not present

## 2016-11-29 LAB — NM MYOCAR MULTI W/SPECT W/WALL MOTION / EF
CHL CUP NUCLEAR SSS: 15
CHL CUP RESTING HR STRESS: 53 {beats}/min
LV sys vol: 56 mL
LVDIAVOL: 123 mL (ref 62–150)
Peak HR: 100 {beats}/min
RATE: 0.41
SDS: 3
SRS: 12
TID: 1.23

## 2016-11-29 MED ORDER — TECHNETIUM TC 99M TETROFOSMIN IV KIT
10.0000 | PACK | Freq: Once | INTRAVENOUS | Status: AC | PRN
  Administered 2016-11-29: 10 via INTRAVENOUS

## 2016-11-29 MED ORDER — TECHNETIUM TC 99M TETROFOSMIN IV KIT
30.0000 | PACK | Freq: Once | INTRAVENOUS | Status: AC | PRN
  Administered 2016-11-29: 30 via INTRAVENOUS

## 2016-11-29 MED ORDER — SODIUM CHLORIDE 0.9% FLUSH
INTRAVENOUS | Status: AC
  Administered 2016-11-29: 10 mL via INTRAVENOUS
  Filled 2016-11-29: qty 10

## 2016-11-29 MED ORDER — REGADENOSON 0.4 MG/5ML IV SOLN
INTRAVENOUS | Status: AC
  Administered 2016-11-29: 0.4 mg via INTRAVENOUS
  Filled 2016-11-29: qty 5

## 2016-12-05 DIAGNOSIS — J449 Chronic obstructive pulmonary disease, unspecified: Secondary | ICD-10-CM | POA: Diagnosis not present

## 2016-12-05 DIAGNOSIS — I2510 Atherosclerotic heart disease of native coronary artery without angina pectoris: Secondary | ICD-10-CM | POA: Diagnosis not present

## 2016-12-05 DIAGNOSIS — E782 Mixed hyperlipidemia: Secondary | ICD-10-CM | POA: Diagnosis not present

## 2016-12-05 DIAGNOSIS — Z1389 Encounter for screening for other disorder: Secondary | ICD-10-CM | POA: Diagnosis not present

## 2016-12-05 DIAGNOSIS — R7309 Other abnormal glucose: Secondary | ICD-10-CM | POA: Diagnosis not present

## 2016-12-05 DIAGNOSIS — Z6824 Body mass index (BMI) 24.0-24.9, adult: Secondary | ICD-10-CM | POA: Diagnosis not present

## 2016-12-05 DIAGNOSIS — I10 Essential (primary) hypertension: Secondary | ICD-10-CM | POA: Diagnosis not present

## 2016-12-19 ENCOUNTER — Telehealth: Payer: Self-pay | Admitting: Cardiology

## 2016-12-19 NOTE — Telephone Encounter (Signed)
Received a message from [facility] regarding patient stress test results. [name] has an H&P appt with Dr. Oneida Alar on 01-12-17 at 9:30am prior to his Pre-anesthesia testing. This will be discussed with him at that time. I will however contact him and give him this information.

## 2016-12-19 NOTE — Telephone Encounter (Signed)
I advised patient that I left a message at [facility] to call him with test results ordered by Dr Oneida Alar.

## 2016-12-19 NOTE — Telephone Encounter (Signed)
Pt has never received the results from his stress test he had on 11/29/16

## 2016-12-22 ENCOUNTER — Telehealth: Payer: Self-pay

## 2016-12-22 NOTE — Telephone Encounter (Signed)
-----   Message from Satira Sark, MD sent at 12/22/2016 11:31 AM EDT ----- Regarding: RE: hold Plavix pre-op Plavix may be held for 7 days prior to Endovascular Stent Graft Repair of AAA as requested by Dr. Oneida Alar. He did have a Myoview in April that was overall intermediate risk with a fairly large inferolateral scar but only mild peri-infarct ischemia and normal LVEF. I would expect he should be able to proceed with planned procedure at intermediate perioperative cardiac risk, presuming he has not having any worsening angina symptoms on medical therapy.  ----- Message ----- From: Bernita Raisin, RN Sent: 12/22/2016  11:28 AM To: Satira Sark, MD Subject: FW: hold Plavix pre-op                         Will forward to Dr [name] ----- Message ----- From: Denman George, RN Sent: 12/22/2016  11:13 AM To: Bernita Raisin, RN Subject: hold Plavix pre-op                             Tye [place]- I sent this message to Dr. [name] on 4/5; while I didn't really ask for his response, since I sent it as an FYI, I feel the need to be sure he is in agreement with the pt. stopping the Plavix 7 days prior.  Can you ask him please?  (h/o CAD, s/p CABG, coronary stenting)  Thank you!   FYI/ hold Plavix pre-op  Received: 4 weeks ago  Message Contents  Denman George, RN  Satira Sark, MD    This is to make you aware that this pt. is scheduled for Endovascular Stent Graft Repair of AAA 01/18/17; Dr. Oneida Alar would like him to hold his Plavix for 7 days prior to procedure. I have instructed him to take last dose 01/10/17, then hold until after surgery. Also, he is scheduled for a Myocardial Perfusion Scan 11/29/16 @ [facility], as part of his pre-op work-up.

## 2017-01-02 ENCOUNTER — Encounter: Payer: Self-pay | Admitting: Family

## 2017-01-05 ENCOUNTER — Encounter (HOSPITAL_COMMUNITY)
Admission: RE | Admit: 2017-01-05 | Discharge: 2017-01-05 | Disposition: A | Discharge status: Home / Self Care | Payer: Medicare Other | Source: Ambulatory Visit | Attending: Vascular Surgery | Admitting: Vascular Surgery

## 2017-01-05 ENCOUNTER — Encounter (HOSPITAL_COMMUNITY): Payer: Self-pay

## 2017-01-05 ENCOUNTER — Ambulatory Visit (INDEPENDENT_AMBULATORY_CARE_PROVIDER_SITE_OTHER): Payer: Medicare Other | Admitting: Vascular Surgery

## 2017-01-05 ENCOUNTER — Encounter: Payer: Self-pay | Admitting: Vascular Surgery

## 2017-01-05 VITALS — BP 119/66 | HR 65 | Temp 99.1°F | Resp 16 | Ht 70.0 in | Wt 164.0 lb

## 2017-01-05 DIAGNOSIS — Z01812 Encounter for preprocedural laboratory examination: Secondary | ICD-10-CM | POA: Insufficient documentation

## 2017-01-05 DIAGNOSIS — I714 Abdominal aortic aneurysm, without rupture, unspecified: Secondary | ICD-10-CM

## 2017-01-05 LAB — BLOOD GAS, ARTERIAL
Acid-Base Excess: 3.3 mmol/L — ABNORMAL HIGH (ref 0.0–2.0)
Bicarbonate: 27.5 mmol/L (ref 20.0–28.0)
DRAWN BY: 470591
FIO2: 21
O2 Saturation: 98.1 %
PATIENT TEMPERATURE: 98.6
PCO2 ART: 43.8 mmHg (ref 32.0–48.0)
PO2 ART: 113 mmHg — AB (ref 83.0–108.0)
pH, Arterial: 7.414 (ref 7.350–7.450)

## 2017-01-05 LAB — TYPE AND SCREEN
ABO/RH(D): O POS
Antibody Screen: NEGATIVE

## 2017-01-05 LAB — URINALYSIS, ROUTINE W REFLEX MICROSCOPIC
Bilirubin Urine: NEGATIVE
GLUCOSE, UA: NEGATIVE mg/dL
HGB URINE DIPSTICK: NEGATIVE
Ketones, ur: NEGATIVE mg/dL
LEUKOCYTES UA: NEGATIVE
Nitrite: NEGATIVE
PROTEIN: NEGATIVE mg/dL
Specific Gravity, Urine: 1.012 (ref 1.005–1.030)
pH: 6 (ref 5.0–8.0)

## 2017-01-05 LAB — COMPREHENSIVE METABOLIC PANEL
ALK PHOS: 81 U/L (ref 38–126)
ALT: 23 U/L (ref 17–63)
ANION GAP: 8 (ref 5–15)
AST: 24 U/L (ref 15–41)
Albumin: 3.9 g/dL (ref 3.5–5.0)
BILIRUBIN TOTAL: 1.2 mg/dL (ref 0.3–1.2)
BUN: 7 mg/dL (ref 6–20)
CALCIUM: 10.1 mg/dL (ref 8.9–10.3)
CO2: 24 mmol/L (ref 22–32)
CREATININE: 1.07 mg/dL (ref 0.61–1.24)
Chloride: 107 mmol/L (ref 101–111)
Glucose, Bld: 103 mg/dL — ABNORMAL HIGH (ref 65–99)
Potassium: 4.3 mmol/L (ref 3.5–5.1)
Sodium: 139 mmol/L (ref 135–145)
TOTAL PROTEIN: 6.2 g/dL — AB (ref 6.5–8.1)

## 2017-01-05 LAB — SURGICAL PCR SCREEN
MRSA, PCR: NEGATIVE
STAPHYLOCOCCUS AUREUS: NEGATIVE

## 2017-01-05 LAB — CBC
HEMATOCRIT: 44.2 % (ref 39.0–52.0)
HEMOGLOBIN: 14.7 g/dL (ref 13.0–17.0)
MCH: 30.4 pg (ref 26.0–34.0)
MCHC: 33.3 g/dL (ref 30.0–36.0)
MCV: 91.5 fL (ref 78.0–100.0)
Platelets: 164 10*3/uL (ref 150–400)
RBC: 4.83 MIL/uL (ref 4.22–5.81)
RDW: 13.3 % (ref 11.5–15.5)
WBC: 5.9 10*3/uL (ref 4.0–10.5)

## 2017-01-05 LAB — PROTIME-INR
INR: 1.05
Prothrombin Time: 13.7 seconds (ref 11.4–15.2)

## 2017-01-05 LAB — ABO/RH: ABO/RH(D): O POS

## 2017-01-05 LAB — APTT: aPTT: 28 seconds (ref 24–36)

## 2017-01-05 MED ORDER — CHLORHEXIDINE GLUCONATE 4 % EX LIQD: 60.0000 mL | Freq: Once | CUTANEOUS | Status: DC

## 2017-01-05 NOTE — Progress Notes (Addendum)
BWG:YKZLDJT, Jenny Reichmann, MD  Cardiologist: Dr. Johnny Bridge  EKG: 08/2016 in [facility]  Stress test: 11/2016 in [facility]  ECHO:2013 in [facility]  Cardiac Cath: Stents placed and bypass surgery in 1995  Chest x-ray: 05/2016 in [facility]

## 2017-01-05 NOTE — Pre-Procedure Instructions (Signed)
[name]  01/05/2017       [facility] - [place], [place] [address] Phone: [phone] Fax: [phone]    Your procedure is scheduled on May 23 at 130 PM.  Report to [place] at [place] AM.  Call this number if you have problems the morning of surgery:  [phone]   Remember:  Do not eat food or drink liquids after midnight.  Take these medicines the morning of surgery with A SIP OF WATER alprazolam (xanax), metoprolol (lopressor), pantoprazole (protonix).  Stop plavix as instructed by your surgeon  7 days prior to surgery STOP taking any Aspirin, Aleve, Naproxen, Ibuprofen, Motrin, Advil, Goody's, BC's, all herbal medications, fish oil, and all vitamins   Do not wear jewelry, make-up or nail polish.  Do not wear lotions, powders, or perfumes, or deoderant.  Men may shave face and neck.  Do not bring valuables to the hospital.  [facility] is not responsible for any belongings or valuables.  Contacts, dentures or bridgework may not be worn into surgery.  Leave your suitcase in the car.  After surgery it may be brought to your room.  For patients admitted to the hospital, discharge time will be determined by your treatment team.  Patients discharged the day of surgery will not be allowed to drive home.    Special instructions:   [place]- Preparing For Surgery  Before surgery, you can play an important role. Because skin is not sterile, your skin needs to be as free of germs as possible. You can reduce the number of germs on your skin by washing with CHG (chlorahexidine gluconate) Soap before surgery.  CHG is an antiseptic cleaner which kills germs and bonds with the skin to continue killing germs even after washing.  Please do not use if you have an allergy to CHG or antibacterial soaps. If your skin becomes reddened/irritated stop using the CHG.  Do not shave (including legs and  underarms) for at least 48 hours prior to first CHG shower. It is OK to shave your face.  Please follow these instructions carefully.   1. Shower the NIGHT BEFORE SURGERY and the MORNING OF SURGERY with CHG.   2. If you chose to wash your hair, wash your hair first as usual with your normal shampoo.  3. After you shampoo, rinse your hair and body thoroughly to remove the shampoo.  4. Use CHG as you would any other liquid soap. You can apply CHG directly to the skin and wash gently with a scrungie or a clean washcloth.   5. Apply the CHG Soap to your body ONLY FROM THE NECK DOWN.  Do not use on open wounds or open sores. Avoid contact with your eyes, ears, mouth and genitals (private parts). Wash genitals (private parts) with your normal soap.  6. Wash thoroughly, paying special attention to the area where your surgery will be performed.  7. Thoroughly rinse your body with warm water from the neck down.  8. DO NOT shower/wash with your normal soap after using and rinsing off the CHG Soap.  9. Pat yourself dry with a CLEAN TOWEL.   10. Wear CLEAN PAJAMAS   11. Place CLEAN SHEETS on your bed the night of your first shower and DO NOT SLEEP WITH PETS.   Day of Surgery: Do not apply any deodorants/lotions. Please wear clean clothes to the hospital/surgery center.  Please read over the following fact sheets that you were given. Pain Booklet, Coughing and Deep Breathing, MRSA Information and Surgical Site Infection Prevention

## 2017-01-05 NOTE — Progress Notes (Signed)
Patient name: [name] MRN: [id number] DOB: 1951/02/18 Sex: male   HPI: [name] is a 66 y.o. male with a known 5 cm abdominal aortic aneurysm.  It was 95 m in diameter in 2013. It has been slowly growing. He continues to deny any abdominal or back pain. He has no chest pain or shortness of breath. He recently had a cardiac stress test done which showed moderate risk. This was reviewed by Dr. [name] and since the patient is asymptomatic he thought he would be a reasonable risk for aneurysm stent graft repair. The patient's Plavix has been stopped. He is on aspirin.   Past Medical History:  Diagnosis Date  . Abdominal aortic aneurysm ([place])   . Adrenal adenoma   . COPD (chronic obstructive pulmonary disease) ([place])   . Coronary artery disease    Multivessel, LVEF 50%, occluded SVG to RCA, DES SVG to OM system 2007  . Essential hypertension   . Hyperlipidemia   . Noncompliance   . Syncope    Acute gastroenteritis; vasovagal  . Systolic dysfunction    EF 45-50%  . Vitamin B12 deficiency anemia   . Wolff-Parkinson-White (WPW) syndrome    Failed ablation   Past Surgical History:  Procedure Laterality Date  . BLADDER SURGERY    . CORONARY ANGIOPLASTY WITH STENT PLACEMENT    . CORONARY ARTERY BYPASS GRAFT  1995   LIMA to LAD , SVG to diagonal, SVG to OM1 and OM2 , SVG to RCA     Family History  Problem Relation Age of Onset  . Varicose Veins Mother   . Heart disease Father   . Heart attack Father   . Bleeding Disorder Sister   . Peripheral vascular disease Sister        amputation  . Diabetes Brother   . Varicose Veins Brother   . Diabetes Brother   . Heart disease Brother   . Varicose Veins Brother     SOCIAL HISTORY: Social History   Social History  . Marital status: Divorced    Spouse name: N/A  . Number of children: N/A  . Years of education: N/A   Occupational History  . Not on file.   Social History Main Topics  . Smoking status: Current  Every Day Smoker    Packs/day: 1.00    Years: 40.00    Types: Cigarettes    Start date: 06/03/1969  . Smokeless tobacco: Never Used  . Alcohol use No  . Drug use: No  . Sexual activity: No   Other Topics Concern  . Not on file   Social History Narrative  . No narrative on file    Allergies  Allergen Reactions  . Codeine Nausea And Vomiting    Passed out    Current Outpatient Prescriptions  Medication Sig Dispense Refill  . ALPRAZolam (XANAX) 0.5 MG tablet Take 0.25 mg by mouth at bedtime as needed for sleep.    Marland Kitchen aspirin 325 MG tablet Take 325 mg by mouth at bedtime.     Marland Kitchen atorvastatin (LIPITOR) 80 MG tablet TAKE 1 TABLET BY MOUTH ONCE DAILY FOR CHOLESTEROL. (Patient taking differently: TAKE 1 TABLET BY MOUTH ONCE DAILY AT NIGHT FOR CHOLESTEROL.) 30 tablet 11  . clopidogrel (PLAVIX) 75 MG tablet TAKE ONE TABLET BY MOUTH ONCE DAILY. 90 tablet 3  . fenofibrate (TRICOR) 145 MG tablet TAKE 1 TABLET BY MOUTH ONCE DAILY FOR CHOLESTEROL. 30 tablet 11  . metoprolol tartrate (LOPRESSOR) 25 MG  tablet TAKE 1 & 1/2 TABLETS BY MOUTH TWICE DAILY. 90 tablet 6  . Multiple Vitamin (ONE-A-DAY MENS PO) Take 1 tablet by mouth daily.    . pantoprazole (PROTONIX) 40 MG tablet TAKE 1 TABLET BY MOUTH ONCE DAILY FOR ACID REFLUX. 30 tablet 6  . polyethylene glycol (MIRALAX / GLYCOLAX) packet Take 17 g by mouth daily as needed for moderate constipation.    . ramipril (ALTACE) 2.5 MG capsule TAKE (1) CAPSULE BY MOUTH ONCE DAILY. (Patient taking differently: TAKE (1) CAPSULE BY MOUTH ONCE DAILY AT NIGHT) 30 capsule 11   No current facility-administered medications for this visit.    Facility-Administered Medications Ordered in Other Visits  Medication Dose Route Frequency Provider Last Rate Last Dose  . chlorhexidine (HIBICLENS) 4 % liquid 4 application  60 mL Topical Once Elam Dutch, MD       And  . [place] Memo ON 01/06/2017] chlorhexidine (HIBICLENS) 4 % liquid 4 application  60 mL Topical Once  Elam Dutch, MD        ROS:   General:  No weight loss, Fever, chills  HEENT: No recent headaches, no nasal bleeding, no visual changes, no sore throat  Neurologic: No dizziness, blackouts, seizures. No recent symptoms of stroke or mini- stroke. No recent episodes of slurred speech, or temporary blindness.  Cardiac: No recent episodes of chest pain/pressure, no shortness of breath at rest.  No shortness of breath with exertion.  Denies history of atrial fibrillation or irregular heartbeat  Vascular: No history of rest pain in feet.  No history of claudication.  No history of non-healing ulcer, No history of DVT   Pulmonary: No home oxygen, no productive cough, no hemoptysis,  No asthma or wheezing  Musculoskeletal:  [ ]  Arthritis, [ ]  Low back pain,  [ ]  Joint pain  Hematologic:No history of hypercoagulable state.  No history of easy bleeding.  No history of anemia  Gastrointestinal: No hematochezia or melena,  No gastroesophageal reflux, no trouble swallowing  Urinary: [ ]  chronic Kidney disease, [ ]  on HD - [ ]  MWF or [ ]  TTHS, [ ]  Burning with urination, [ ]  Frequent urination, [ ]  Difficulty urinating;   Skin: No rashes  Psychological: No history of anxiety,  No history of depression   Physical Examination  Vitals:   01/05/17 1544  BP: 119/66  Pulse: 65  Resp: 16  Temp: 99.1 F (37.3 C)  TempSrc: Oral  SpO2: 93%  Weight: 164 lb (74.4 kg)  Height: 5\' 10"  (1.778 m)    Body mass index is 23.53 kg/m.  General:  Alert and oriented, no acute distress HEENT: Normal Neck: No bruit or JVD Pulmonary: Clear to auscultation bilaterally Cardiac: Regular Rate and Rhythm without murmur Abdomen: Soft, non-tender, non-distended Skin: No rash Extremity Pulses:  2+ radial, brachial, femoral, dorsalis pedis, posterior tibial pulses bilaterally Musculoskeletal: No deformity or edema  Neurologic: Upper and lower extremity motor 5/5 and symmetric  DATA: I reviewed the  patient's CT angiogram from December 2017. This shows an infrarenal abdominal aortic aneurysm 5 centimeter diameter with at least a 15 mm neck some tortuosity of the iliacs but overall seems to be fairly straightforward for aneurysm stent graft repair.  ASSESSMENT:  Asymptomatic 5 cm abdominal aortic aneurysm slowly growing over the last several years.   PLAN:  Endovascular stent graft repair with Gore Excluder stent graft next week plan is for percutaneous repair but I did discuss the patient today risk benefits possible complications including  but not limited to bleeding infection possible need for incision cutdown of both femoral arteries myocardial events 5% infection risk 1-2% he understands and agrees to proceed.   Ruta Hinds, MD [facility] [place] Office: [phone] Pager: [phone]

## 2017-01-11 ENCOUNTER — Inpatient Hospital Stay (HOSPITAL_COMMUNITY): Payer: Medicare Other | Admitting: Certified Registered"

## 2017-01-11 ENCOUNTER — Encounter (HOSPITAL_COMMUNITY): Payer: Self-pay | Admitting: Anesthesiology

## 2017-01-11 ENCOUNTER — Encounter (HOSPITAL_COMMUNITY)
Admission: RE | Disposition: A | Discharge status: Home / Self Care | Payer: Self-pay | Source: Ambulatory Visit | Attending: Vascular Surgery

## 2017-01-11 ENCOUNTER — Inpatient Hospital Stay (HOSPITAL_COMMUNITY): Payer: Medicare Other

## 2017-01-11 ENCOUNTER — Inpatient Hospital Stay (HOSPITAL_COMMUNITY)
Admission: RE | Admit: 2017-01-11 | Discharge: 2017-01-12 | DRG: 269 | Disposition: A | Discharge status: Home / Self Care | Payer: Medicare Other | Source: Ambulatory Visit | Attending: Vascular Surgery | Admitting: Vascular Surgery

## 2017-01-11 DIAGNOSIS — Z95828 Presence of other vascular implants and grafts: Secondary | ICD-10-CM

## 2017-01-11 DIAGNOSIS — Z951 Presence of aortocoronary bypass graft: Secondary | ICD-10-CM | POA: Diagnosis not present

## 2017-01-11 DIAGNOSIS — I714 Abdominal aortic aneurysm, without rupture, unspecified: Secondary | ICD-10-CM | POA: Diagnosis present

## 2017-01-11 DIAGNOSIS — Z7982 Long term (current) use of aspirin: Secondary | ICD-10-CM

## 2017-01-11 DIAGNOSIS — J440 Chronic obstructive pulmonary disease with acute lower respiratory infection: Secondary | ICD-10-CM | POA: Diagnosis present

## 2017-01-11 DIAGNOSIS — E785 Hyperlipidemia, unspecified: Secondary | ICD-10-CM | POA: Diagnosis not present

## 2017-01-11 DIAGNOSIS — K219 Gastro-esophageal reflux disease without esophagitis: Secondary | ICD-10-CM | POA: Diagnosis not present

## 2017-01-11 DIAGNOSIS — Z8679 Personal history of other diseases of the circulatory system: Secondary | ICD-10-CM

## 2017-01-11 DIAGNOSIS — I2510 Atherosclerotic heart disease of native coronary artery without angina pectoris: Secondary | ICD-10-CM | POA: Diagnosis present

## 2017-01-11 DIAGNOSIS — D3500 Benign neoplasm of unspecified adrenal gland: Secondary | ICD-10-CM | POA: Diagnosis present

## 2017-01-11 DIAGNOSIS — J9811 Atelectasis: Secondary | ICD-10-CM | POA: Diagnosis not present

## 2017-01-11 DIAGNOSIS — Z79899 Other long term (current) drug therapy: Secondary | ICD-10-CM

## 2017-01-11 DIAGNOSIS — I4891 Unspecified atrial fibrillation: Secondary | ICD-10-CM | POA: Diagnosis not present

## 2017-01-11 DIAGNOSIS — F17210 Nicotine dependence, cigarettes, uncomplicated: Secondary | ICD-10-CM | POA: Diagnosis present

## 2017-01-11 HISTORY — PX: ABDOMINAL AORTIC ENDOVASCULAR STENT GRAFT: SHX5707

## 2017-01-11 LAB — BASIC METABOLIC PANEL
ANION GAP: 6 (ref 5–15)
BUN: 7 mg/dL (ref 6–20)
CHLORIDE: 108 mmol/L (ref 101–111)
CO2: 25 mmol/L (ref 22–32)
CREATININE: 1 mg/dL (ref 0.61–1.24)
Calcium: 9.5 mg/dL (ref 8.9–10.3)
GFR calc non Af Amer: >60 mL/min (ref >60)
Glucose, Bld: 83 mg/dL (ref 65–99)
POTASSIUM: 3.6 mmol/L (ref 3.5–5.1)
Sodium: 139 mmol/L (ref 135–145)

## 2017-01-11 LAB — CBC
HEMATOCRIT: 41.1 % (ref 39.0–52.0)
Hemoglobin: 13.7 g/dL (ref 13.0–17.0)
MCH: 30.7 pg (ref 26.0–34.0)
MCHC: 33.3 g/dL (ref 30.0–36.0)
MCV: 92.2 fL (ref 78.0–100.0)
Platelets: 140 10*3/uL — ABNORMAL LOW (ref 150–400)
RBC: 4.46 MIL/uL (ref 4.22–5.81)
RDW: 12.9 % (ref 11.5–15.5)
WBC: 8.3 10*3/uL (ref 4.0–10.5)

## 2017-01-11 LAB — PROTIME-INR
INR: 1.13
Prothrombin Time: 14.6 seconds (ref 11.4–15.2)

## 2017-01-11 LAB — MAGNESIUM: Magnesium: 1.6 mg/dL — ABNORMAL LOW (ref 1.7–2.4)

## 2017-01-11 LAB — APTT: APTT: 34 s (ref 24–36)

## 2017-01-11 SURGERY — INSERTION, ENDOVASCULAR STENT GRAFT, AORTA, ABDOMINAL
Anesthesia: General | Site: Abdomen

## 2017-01-11 MED ORDER — GUAIFENESIN-DM 100-10 MG/5ML PO SYRP: 15.0000 mL | ORAL_SOLUTION | ORAL | Status: DC | PRN

## 2017-01-11 MED ORDER — DEXTROSE 5 % IV SOLN
1.5000 g | INTRAVENOUS | Status: AC
  Administered 2017-01-11: 1.5 g via INTRAVENOUS
  Filled 2017-01-11: qty 1.5

## 2017-01-11 MED ORDER — DEXTROSE 5 % IV SOLN
1.5000 g | Freq: Two times a day (BID) | INTRAVENOUS | Status: AC
  Administered 2017-01-11 – 2017-01-12 (×2): 1.5 g via INTRAVENOUS
  Filled 2017-01-11 (×2): qty 1.5

## 2017-01-11 MED ORDER — METOPROLOL TARTRATE 5 MG/5ML IV SOLN
2.0000 mg | INTRAVENOUS | Status: DC | PRN
Start: 2017-01-11 — End: 2017-01-12

## 2017-01-11 MED ORDER — EPHEDRINE 5 MG/ML INJ
INTRAVENOUS | Status: AC
  Filled 2017-01-11: qty 10

## 2017-01-11 MED ORDER — LIDOCAINE 2% (20 MG/ML) 5 ML SYRINGE
INTRAMUSCULAR | Status: AC
  Filled 2017-01-11: qty 5

## 2017-01-11 MED ORDER — ALPRAZOLAM 0.25 MG PO TABS: 0.2500 mg | ORAL_TABLET | Freq: Every evening | ORAL | Status: DC | PRN

## 2017-01-11 MED ORDER — LABETALOL HCL 5 MG/ML IV SOLN: 10.0000 mg | INTRAVENOUS | Status: DC | PRN

## 2017-01-11 MED ORDER — ASPIRIN 325 MG PO TABS: 325.0000 mg | ORAL_TABLET | Freq: Every day | ORAL | Status: DC

## 2017-01-11 MED ORDER — SUGAMMADEX SODIUM 200 MG/2ML IV SOLN
INTRAVENOUS | Status: AC
  Filled 2017-01-11: qty 2

## 2017-01-11 MED ORDER — PHENYLEPHRINE 40 MCG/ML (10ML) SYRINGE FOR IV PUSH (FOR BLOOD PRESSURE SUPPORT)
PREFILLED_SYRINGE | INTRAVENOUS | Status: DC | PRN
Start: 2017-01-11 — End: 2017-01-11
  Administered 2017-01-11 (×2): 80 ug via INTRAVENOUS

## 2017-01-11 MED ORDER — PHENOL 1.4 % MT LIQD: 1.0000 | OROMUCOSAL | Status: DC | PRN

## 2017-01-11 MED ORDER — IODIXANOL 320 MG/ML IV SOLN
INTRAVENOUS | Status: DC | PRN
  Administered 2017-01-11: 60.4 mL

## 2017-01-11 MED ORDER — ACETAMINOPHEN 325 MG PO TABS
325.0000 mg | ORAL_TABLET | ORAL | Status: DC | PRN
  Administered 2017-01-11: 650 mg via ORAL
  Filled 2017-01-11: qty 2

## 2017-01-11 MED ORDER — ONDANSETRON HCL 4 MG/2ML IJ SOLN: 4.0000 mg | Freq: Four times a day (QID) | INTRAMUSCULAR | Status: DC | PRN

## 2017-01-11 MED ORDER — ROCURONIUM BROMIDE 10 MG/ML (PF) SYRINGE
PREFILLED_SYRINGE | INTRAVENOUS | Status: AC
  Filled 2017-01-11: qty 5

## 2017-01-11 MED ORDER — MORPHINE SULFATE (PF) 4 MG/ML IV SOLN: 2.0000 mg | INTRAVENOUS | Status: DC | PRN

## 2017-01-11 MED ORDER — MIDAZOLAM HCL 2 MG/2ML IJ SOLN
INTRAMUSCULAR | Status: DC | PRN
  Administered 2017-01-11: 2 mg via INTRAVENOUS

## 2017-01-11 MED ORDER — PHENYLEPHRINE HCL 10 MG/ML IJ SOLN
INTRAMUSCULAR | Status: AC
  Filled 2017-01-11: qty 1

## 2017-01-11 MED ORDER — PHENYLEPHRINE HCL 10 MG/ML IJ SOLN
INTRAVENOUS | Status: DC | PRN
  Administered 2017-01-11: 50 ug/min via INTRAVENOUS

## 2017-01-11 MED ORDER — PANTOPRAZOLE SODIUM 40 MG PO TBEC
40.0000 mg | DELAYED_RELEASE_TABLET | Freq: Every day | ORAL | Status: DC
  Administered 2017-01-12: 40 mg via ORAL
  Filled 2017-01-11: qty 1

## 2017-01-11 MED ORDER — CLOPIDOGREL BISULFATE 75 MG PO TABS
75.0000 mg | ORAL_TABLET | Freq: Every day | ORAL | Status: DC
  Administered 2017-01-12: 75 mg via ORAL
  Filled 2017-01-11: qty 1

## 2017-01-11 MED ORDER — MAGNESIUM SULFATE 2 GM/50ML IV SOLN: 2.0000 g | Freq: Every day | INTRAVENOUS | Status: DC | PRN

## 2017-01-11 MED ORDER — LABETALOL HCL 5 MG/ML IV SOLN
INTRAVENOUS | Status: DC | PRN
  Administered 2017-01-11: 10 mg via INTRAVENOUS

## 2017-01-11 MED ORDER — FENTANYL CITRATE (PF) 100 MCG/2ML IJ SOLN
INTRAMUSCULAR | Status: AC
  Filled 2017-01-11: qty 2

## 2017-01-11 MED ORDER — SODIUM CHLORIDE 0.9 % IV SOLN
INTRAVENOUS | Status: DC | PRN
  Administered 2017-01-11: 14:00:00

## 2017-01-11 MED ORDER — ONDANSETRON HCL 4 MG/2ML IJ SOLN
INTRAMUSCULAR | Status: DC | PRN
  Administered 2017-01-11: 4 mg via INTRAVENOUS

## 2017-01-11 MED ORDER — OXYCODONE-ACETAMINOPHEN 5-325 MG PO TABS: 1.0000 | ORAL_TABLET | ORAL | Status: DC | PRN

## 2017-01-11 MED ORDER — MIDAZOLAM HCL 2 MG/2ML IJ SOLN
INTRAMUSCULAR | Status: AC
  Filled 2017-01-11: qty 2

## 2017-01-11 MED ORDER — HEPARIN SODIUM (PORCINE) 1000 UNIT/ML IJ SOLN
INTRAMUSCULAR | Status: AC
  Filled 2017-01-11: qty 1

## 2017-01-11 MED ORDER — PHENYLEPHRINE 40 MCG/ML (10ML) SYRINGE FOR IV PUSH (FOR BLOOD PRESSURE SUPPORT)
PREFILLED_SYRINGE | INTRAVENOUS | Status: AC
  Filled 2017-01-11: qty 10

## 2017-01-11 MED ORDER — POLYETHYLENE GLYCOL 3350 17 G PO PACK: 17.0000 g | PACK | Freq: Every day | ORAL | Status: DC | PRN

## 2017-01-11 MED ORDER — PROPOFOL 10 MG/ML IV BOLUS
INTRAVENOUS | Status: AC
  Filled 2017-01-11: qty 20

## 2017-01-11 MED ORDER — 0.9 % SODIUM CHLORIDE (POUR BTL) OPTIME
TOPICAL | Status: DC | PRN
  Administered 2017-01-11: 1000 mL

## 2017-01-11 MED ORDER — FENTANYL CITRATE (PF) 250 MCG/5ML IJ SOLN
INTRAMUSCULAR | Status: DC | PRN
  Administered 2017-01-11: 25 ug via INTRAVENOUS
  Administered 2017-01-11: 100 ug via INTRAVENOUS
  Administered 2017-01-11: 25 ug via INTRAVENOUS
  Administered 2017-01-11 (×2): 50 ug via INTRAVENOUS

## 2017-01-11 MED ORDER — HEPARIN SODIUM (PORCINE) 1000 UNIT/ML IJ SOLN
INTRAMUSCULAR | Status: DC | PRN
  Administered 2017-01-11: 8000 [IU] via INTRAVENOUS

## 2017-01-11 MED ORDER — FENTANYL CITRATE (PF) 250 MCG/5ML IJ SOLN
INTRAMUSCULAR | Status: AC
  Filled 2017-01-11: qty 5

## 2017-01-11 MED ORDER — SODIUM CHLORIDE 0.9 % IV SOLN: INTRAVENOUS | Status: DC

## 2017-01-11 MED ORDER — ACETAMINOPHEN 650 MG RE SUPP: 325.0000 mg | RECTAL | Status: DC | PRN

## 2017-01-11 MED ORDER — PROPOFOL 10 MG/ML IV BOLUS
INTRAVENOUS | Status: DC | PRN
  Administered 2017-01-11: 120 mg via INTRAVENOUS

## 2017-01-11 MED ORDER — POTASSIUM CHLORIDE CRYS ER 20 MEQ PO TBCR: 20.0000 meq | EXTENDED_RELEASE_TABLET | Freq: Every day | ORAL | Status: DC | PRN

## 2017-01-11 MED ORDER — ONDANSETRON HCL 4 MG/2ML IJ SOLN
INTRAMUSCULAR | Status: AC
  Filled 2017-01-11: qty 2

## 2017-01-11 MED ORDER — HYDRALAZINE HCL 20 MG/ML IJ SOLN: 5.0000 mg | INTRAMUSCULAR | Status: DC | PRN

## 2017-01-11 MED ORDER — LIDOCAINE 2% (20 MG/ML) 5 ML SYRINGE
INTRAMUSCULAR | Status: DC | PRN
  Administered 2017-01-11: 100 mg via INTRAVENOUS

## 2017-01-11 MED ORDER — PROTAMINE SULFATE 10 MG/ML IV SOLN
INTRAVENOUS | Status: DC | PRN
  Administered 2017-01-11: 80 mg via INTRAVENOUS

## 2017-01-11 MED ORDER — MEPERIDINE HCL 25 MG/ML IJ SOLN: 6.2500 mg | INTRAMUSCULAR | Status: DC | PRN

## 2017-01-11 MED ORDER — ATORVASTATIN CALCIUM 80 MG PO TABS
80.0000 mg | ORAL_TABLET | Freq: Every day | ORAL | Status: DC
  Administered 2017-01-11: 80 mg via ORAL
  Filled 2017-01-11: qty 1

## 2017-01-11 MED ORDER — SUGAMMADEX SODIUM 200 MG/2ML IV SOLN
INTRAVENOUS | Status: DC | PRN
  Administered 2017-01-11: 200 mg via INTRAVENOUS

## 2017-01-11 MED ORDER — EPHEDRINE SULFATE-NACL 50-0.9 MG/10ML-% IV SOSY
PREFILLED_SYRINGE | INTRAVENOUS | Status: DC | PRN
  Administered 2017-01-11: 10 mg via INTRAVENOUS

## 2017-01-11 MED ORDER — ALUM & MAG HYDROXIDE-SIMETH 200-200-20 MG/5ML PO SUSP: 15.0000 mL | ORAL | Status: DC | PRN

## 2017-01-11 MED ORDER — DOCUSATE SODIUM 100 MG PO CAPS
100.0000 mg | ORAL_CAPSULE | Freq: Every day | ORAL | Status: DC
  Administered 2017-01-12: 100 mg via ORAL
  Filled 2017-01-11: qty 1

## 2017-01-11 MED ORDER — FENTANYL CITRATE (PF) 100 MCG/2ML IJ SOLN
25.0000 ug | INTRAMUSCULAR | Status: DC | PRN
  Administered 2017-01-11 (×2): 50 ug via INTRAVENOUS

## 2017-01-11 MED ORDER — LACTATED RINGERS IV SOLN
INTRAVENOUS | Status: DC
  Administered 2017-01-11 (×2): via INTRAVENOUS

## 2017-01-11 MED ORDER — BISACODYL 10 MG RE SUPP: 10.0000 mg | Freq: Every day | RECTAL | Status: DC | PRN

## 2017-01-11 MED ORDER — PROMETHAZINE HCL 25 MG/ML IJ SOLN: 6.2500 mg | INTRAMUSCULAR | Status: DC | PRN

## 2017-01-11 MED ORDER — SODIUM CHLORIDE 0.9 % IV SOLN: 500.0000 mL | Freq: Once | INTRAVENOUS | Status: DC | PRN

## 2017-01-11 MED ORDER — METOPROLOL TARTRATE 25 MG PO TABS
37.5000 mg | ORAL_TABLET | Freq: Two times a day (BID) | ORAL | Status: DC
  Administered 2017-01-11 – 2017-01-12 (×2): 37.5 mg via ORAL
  Filled 2017-01-11 (×2): qty 1

## 2017-01-11 MED ORDER — ROCURONIUM BROMIDE 10 MG/ML (PF) SYRINGE
PREFILLED_SYRINGE | INTRAVENOUS | Status: DC | PRN
  Administered 2017-01-11: 30 mg via INTRAVENOUS
  Administered 2017-01-11: 40 mg via INTRAVENOUS

## 2017-01-11 SURGICAL SUPPLY — 54 items
CANISTER SUCT 3000ML PPV (MISCELLANEOUS) ×2 IMPLANT
CATH ANGIO 5F BER2 65CM (CATHETERS) ×2 IMPLANT
CATH BEACON 5.038 65CM KMP-01 (CATHETERS) IMPLANT
CATH OMNI FLUSH .035X70CM (CATHETERS) IMPLANT
CATH SOS OMNI O 5F 80CM (CATHETERS) ×2 IMPLANT
COVER PROBE W GEL 5X96 (DRAPES) ×2 IMPLANT
DERMABOND ADVANCED (GAUZE/BANDAGES/DRESSINGS) ×1
DERMABOND ADVANCED .7 DNX12 (GAUZE/BANDAGES/DRESSINGS) ×1 IMPLANT
DEVICE CLOSURE PERCLS PRGLD 6F (VASCULAR PRODUCTS) ×5 IMPLANT
DRSG TEGADERM 2-3/8X2-3/4 SM (GAUZE/BANDAGES/DRESSINGS) ×2 IMPLANT
DRYSEAL FLEXSHEATH 12FR 33CM (SHEATH) ×1
DRYSEAL FLEXSHEATH 16FR 33CM (SHEATH) ×1
ELECT CAUTERY BLADE 6.4 (BLADE) ×2 IMPLANT
ELECT REM PT RETURN 9FT ADLT (ELECTROSURGICAL) ×4
ELECTRODE REM PT RTRN 9FT ADLT (ELECTROSURGICAL) ×2 IMPLANT
EXCLUDER TNK LEG 23MX14X18 (Endovascular Graft) ×1 IMPLANT
EXCLUDER TRUNK LEG 23MX14X18 (Endovascular Graft) ×2 IMPLANT
GAUZE SPONGE 2X2 8PLY STRL LF (GAUZE/BANDAGES/DRESSINGS) ×1 IMPLANT
GLOVE BIO SURGEON STRL SZ7.5 (GLOVE) ×2 IMPLANT
GOWN STRL REUS W/ TWL LRG LVL3 (GOWN DISPOSABLE) ×3 IMPLANT
GOWN STRL REUS W/TWL LRG LVL3 (GOWN DISPOSABLE) ×3
GRAFT BALLN CATH 65CM (STENTS) ×1 IMPLANT
GUIDEWIRE ANGLED .035X150CM (WIRE) ×2 IMPLANT
HEMOSTAT SPONGE AVITENE ULTRA (HEMOSTASIS) IMPLANT
KIT BASIN OR (CUSTOM PROCEDURE TRAY) ×2 IMPLANT
KIT ROOM TURNOVER OR (KITS) ×2 IMPLANT
LEG CONTRALATERAL 16X12X14 (Vascular Products) ×1 IMPLANT
NEEDLE PERC 18GX7CM (NEEDLE) ×2 IMPLANT
NS IRRIG 1000ML POUR BTL (IV SOLUTION) ×2 IMPLANT
PACK ENDOVASCULAR (PACKS) ×2 IMPLANT
PAD ARMBOARD 7.5X6 YLW CONV (MISCELLANEOUS) ×4 IMPLANT
PENCIL BUTTON HOLSTER BLD 10FT (ELECTRODE) ×2 IMPLANT
PERCLOSE PROGLIDE 6F (VASCULAR PRODUCTS) ×10
SHEATH AVANTI 11CM 8FR (MISCELLANEOUS) ×2 IMPLANT
SHEATH BRITE TIP 8FR 23CM (MISCELLANEOUS) ×2 IMPLANT
SHEATH DRYSEAL FLEX 12FR 33CM (SHEATH) ×1 IMPLANT
SHEATH DRYSEAL FLEX 16FR 33CM (SHEATH) ×1 IMPLANT
SPONGE GAUZE 2X2 STER 10/PKG (GAUZE/BANDAGES/DRESSINGS) ×1
STAPLER VISISTAT 35W (STAPLE) IMPLANT
STENT GRAFT BALLN CATH 65CM (STENTS) ×1
STENT GRAFT CONTRALAT 16X12X14 (Vascular Products) ×1 IMPLANT
STOPCOCK MORSE 400PSI 3WAY (MISCELLANEOUS) ×2 IMPLANT
SUT PROLENE 5 0 C 1 24 (SUTURE) IMPLANT
SUT VIC AB 2-0 SH 27 (SUTURE)
SUT VIC AB 2-0 SH 27XBRD (SUTURE) IMPLANT
SUT VIC AB 3-0 SH 27 (SUTURE)
SUT VIC AB 3-0 SH 27X BRD (SUTURE) IMPLANT
SUT VICRYL 4-0 PS2 18IN ABS (SUTURE) ×4 IMPLANT
SYR 30ML LL (SYRINGE) ×2 IMPLANT
TRAY FOLEY W/METER SILVER 16FR (SET/KITS/TRAYS/PACK) IMPLANT
TUBE CONNECTING 20X1/4 (TUBING) ×2 IMPLANT
TUBING HIGH PRESSURE 120CM (CONNECTOR) ×2 IMPLANT
WIRE AMPLATZ SS-J .035X180CM (WIRE) ×4 IMPLANT
WIRE BENTSON .035X145CM (WIRE) ×4 IMPLANT

## 2017-01-11 NOTE — Progress Notes (Signed)
  Day of Surgery Note    Subjective:  C/o soreness in the groins  Vitals:   01/11/17 0843 01/11/17 1526  BP: 132/76   Pulse: (!) 53   Resp: 16   Temp: 98.6 F (37 C) (P) 97.5 F (36.4 C)    Incisions:   Bilateral groins are soft without hematoma Extremities:  2+ palpable DP pulses Cardiac:  regular Lungs:  Non labored Abdomen:  Soft, NT/ND   Assessment/Plan:  This is a 66 y.o. male who is s/p Gore excluder stent graft repair of infrarenal aortic aneurysm  -pt doing well post op.  Easily palpable DP pulses bilaterally. -groins are soft without hematoma -to 4 east when bed available. -most likely home tomorrow if uneventful night   Leontine Locket, PA-C 01/11/2017 3:44 PM [phone]

## 2017-01-11 NOTE — Transfer of Care (Signed)
Immediate Anesthesia Transfer of Care Note  Patient: [name]  Procedure(s) Performed: Procedure(s): ABDOMINAL AORTIC ENDOVASCULAR STENT GRAFT (N/A)  Patient Location: PACU  Anesthesia Type:General  Level of Consciousness: awake, alert  and oriented  Airway & Oxygen Therapy: Patient Spontanous Breathing and Patient connected to face mask oxygen  Post-op Assessment: Report given to RN and Post -op Vital signs reviewed and stable  Post vital signs: Reviewed and stable  Last Vitals:  Vitals:   01/11/17 0843  BP: 132/76  Pulse: (!) 53  Resp: 16  Temp: 37 C    Last Pain:  Vitals:   01/11/17 0843  TempSrc: Oral         Complications: No apparent anesthesia complications

## 2017-01-11 NOTE — Anesthesia Postprocedure Evaluation (Signed)
Anesthesia Post Note  Patient: [name]  Procedure(s) Performed: Procedure(s) (LRB): ABDOMINAL AORTIC ENDOVASCULAR STENT GRAFT (N/A)  Patient location during evaluation: PACU Anesthesia Type: General Level of consciousness: awake and alert and oriented Pain management: pain level controlled Vital Signs Assessment: post-procedure vital signs reviewed and stable Respiratory status: spontaneous breathing, nonlabored ventilation, respiratory function stable and patient connected to nasal cannula oxygen Cardiovascular status: blood pressure returned to baseline and stable Postop Assessment: no signs of nausea or vomiting Anesthetic complications: no       Last Vitals:  Vitals:   01/11/17 0843 01/11/17 1526  BP: 132/76   Pulse: (!) 53   Resp: 16   Temp: 37 C 36.4 C    Last Pain:  Vitals:   01/11/17 0843  TempSrc: Oral                 Christorpher Hisaw A.

## 2017-01-11 NOTE — Op Note (Signed)
Procedure: [name] excluder stent graft repair of infrarenal aortic aneurysm  Asst: Leontine Locket, PA-C  Operative findings:   #1 Bilateral Proglide closure   #2 23 x 14 x 18 cm main body Gore Excluder device delivered via a right femoral system   #3 12 x 14 cm left iliac limb contralateral   PROCEDURE DETAIL: After obtaining informed consent the patient was taken to the operating room. The patient was placed in supine position the operating room table. After induction of general anesthesia and endotracheal intubation a Foley catheter was placed. Next the patient was prepped and draped in usual sterile fashion from the nipples down to the knees. Ultrasound was used to identify the right common femoral artery as well as the femoral bifurcation. An 11 blade was used to make a small neck in the skin over the level of the right common femoral artery. An introducer needle was then used to cannulate the right common femoral artery without difficulty. A 0.035 Bentson wire was then threaded up into the abdominal aorta through the right femoral system. A short 9 French dilator was placed over the guidewire the right femoral system. This was used to dilate the tract. The dilator was then removed and a Proglide device inserted over the guidewire into the right femoral system and this was deployed at the 2:00 position. The Proglide device was then removed and an additional Proglide device was brought in operative field and deployed at the 10:00 position. The sutures were kept separate and tagged with suture tags. Next the short 9 French sheath was then placed back over the guidewire into the right femoral system and the dilator removed and the sheath thoroughly flushed with heparinized saline. Attention was then turned to the left groin. Again using ultrasound the left common femoral artery was identified. The femoral bifurcation was also identified. A small nick was made in the skin with the 11 blade. A hemostat was  used to dilate a tract down to the artery. An introducer needle was then used to cannulate the left common femoral artery without difficulty. A 0.035 Bentson wire was then threaded up into the abdominal aorta under fluoroscopic guidance. A 9 French dilator was then placed over the wire to dilate the tract. Two Proglide devices were again brought on operative field and these were fired sequentially in the 10:00 position followed by an additional Proglide at the 2:00 position. The Proglide delivery systems were removed and the long 9 French sheath placed back over the guidewire up to the level of the iliac of the aortic bifurcation.  At this point, a 5 [place] Omni flush catheter was placed over the guidewire and the left groin up and the abdominal aorta. An abdominal aortogram was obtained in a 20 degree cranial 5 degree LAO position to determine level of the left and right renal arteries. At this point a 23 x 14 x 18 Gore Excluder main body device was selected. The Bentson wire from the right groin was advanced up into the descending thoracic aorta over a Kumpe catheter and the Bentsen wire replaced with an 035 Amplatz wire. A 16 Fr dry seal was placed over this.  The main body device was then placed over the Amplatz wire in the right groin and advanced up to the level of the renal arteries.   Magnified views of the renal arteries were performed to make sure that we were not covering these. The top portion of the stent graft was then deployed with the end  of the stent just below the level of the right renal artery and this came over to just below the level of the left renal artery. The main body was delivered all the way down to the contralateral gate. Attention was then turned to the left groin. The Omni flush catheter was pulled down over a guidewire and removed and the Bentson wire placed in position to cannulate the contralateral gate. The KMP catheter was placed back over the guidewire in the left femoral  system. A 5 [place] KMP catheter was placed over this and this was used to selectively catheterize the contralateral gate and the guidewire was then advanced into the descending thoracic aorta. The main body portion of the gate was confirmed by twirling the pigtail catheter. The pigtail  catheter was then placed in a location so that we could use its markers to determine the exact length to the iliac bifurcation. An Amplatz wire was placed back through the pigtail catheter. A retrograde contrast angiogram was performed to determine the level of the left internal iliac artery and a 12 x 14 cm iliac limb was selected.  The 9 Fr sheath was replaced with a 12 Fr dryseal over an amplatz wire. The pigtail catheter was removed over the guidewire and the 12 x 14 cm limb advanced so there was full coverage of the long marker on the contralateral limb. This was then deployed in the usual fashion down to the iliac bifurcation. The delivery system was then removed. The remainder of the ipsilateral iliac limb was also deployed.  A retrograde contrast angiogram was also performed to make sure that the right iliac limb did not cover the right internal iliac artery.  I pushed in slightly on the right limb as we deployed to make sure the limb did not cover the right internal.  Attention was then turned back to the left iliac system and a Gore aortic balloon placed over the wire up to the level of the proximal end of the stent and this was ballooned to profile. The limb attachment site was also ballooned as well as the distal attachment site. Attention was then turned to the right groin and the balloon was advanced over the guidewire on the right side and the distal attachment site as well as the midportion of iliac limb were also ballooned. The 5 [place] Omni flush catheter was then placed back to the guidewire on the right side and a completion arteriogram was obtained. This showed a type II endoleak from a left side lumbar.   At  this point the Omni flush catheter was removed over a guidewire. All delivery devices were removed. We then proceeded to remove the large 18 French sheath from the right side with the guidewire in place. With pressure held above and below the insertion site the lateral and medial Proglide closure was secured down. There was still some oozing. An additional central Proglide was then placed over the guidewire and deployed.  There was good hemostasis. The guidewire was removed from the right side. Attention was then turned to the left groin. In similar fashion the 12 French sheath was removed and the guidewire left in place. The lateral and medial Proglide was secured to obtain hemostasis.  There was good hemostasis and the Bentsen wire was removed from the left groin. The patient had been given 8000 units of heparin before introducing the main body device. This was fully reversed with 80 mg of protamine at the end of the case. Each  groin puncture site was then closed with a running 4-0 Vicryl subcuticular stitch.  The patient tolerated procedure well and there were no complications. Instrument sponge and needle counts correct in the case. Patient was awakened in the operating room extubated and taken to the recovery room in stable condition.  Ruta Hinds, MD [facility] [place] Office: [phone] Pager: [phone]

## 2017-01-11 NOTE — H&P (View-Only) (Signed)
Patient name: [name] MRN: [id number] DOB: 1951/03/14 Sex: male   HPI: [name] is a 66 y.o. male with a known 5 cm abdominal aortic aneurysm.  It was 75 m in diameter in 2013. It has been slowly growing. He continues to deny any abdominal or back pain. He has no chest pain or shortness of breath. He recently had a cardiac stress test done which showed moderate risk. This was reviewed by Dr. [name] and since the patient is asymptomatic he thought he would be a reasonable risk for aneurysm stent graft repair. The patient's Plavix has been stopped. He is on aspirin.   Past Medical History:  Diagnosis Date  . Abdominal aortic aneurysm ([place])   . Adrenal adenoma   . COPD (chronic obstructive pulmonary disease) ([place])   . Coronary artery disease    Multivessel, LVEF 50%, occluded SVG to RCA, DES SVG to OM system 2007  . Essential hypertension   . Hyperlipidemia   . Noncompliance   . Syncope    Acute gastroenteritis; vasovagal  . Systolic dysfunction    EF 45-50%  . Vitamin B12 deficiency anemia   . Wolff-Parkinson-White (WPW) syndrome    Failed ablation   Past Surgical History:  Procedure Laterality Date  . BLADDER SURGERY    . CORONARY ANGIOPLASTY WITH STENT PLACEMENT    . CORONARY ARTERY BYPASS GRAFT  1995   LIMA to LAD , SVG to diagonal, SVG to OM1 and OM2 , SVG to RCA     Family History  Problem Relation Age of Onset  . Varicose Veins Mother   . Heart disease Father   . Heart attack Father   . Bleeding Disorder Sister   . Peripheral vascular disease Sister        amputation  . Diabetes Brother   . Varicose Veins Brother   . Diabetes Brother   . Heart disease Brother   . Varicose Veins Brother     SOCIAL HISTORY: Social History   Social History  . Marital status: Divorced    Spouse name: N/A  . Number of children: N/A  . Years of education: N/A   Occupational History  . Not on file.   Social History Main Topics  . Smoking status: Current  Every Day Smoker    Packs/day: 1.00    Years: 40.00    Types: Cigarettes    Start date: 06/03/1969  . Smokeless tobacco: Never Used  . Alcohol use No  . Drug use: No  . Sexual activity: No   Other Topics Concern  . Not on file   Social History Narrative  . No narrative on file    Allergies  Allergen Reactions  . Codeine Nausea And Vomiting    Passed out    Current Outpatient Prescriptions  Medication Sig Dispense Refill  . ALPRAZolam (XANAX) 0.5 MG tablet Take 0.25 mg by mouth at bedtime as needed for sleep.    Marland Kitchen aspirin 325 MG tablet Take 325 mg by mouth at bedtime.     Marland Kitchen atorvastatin (LIPITOR) 80 MG tablet TAKE 1 TABLET BY MOUTH ONCE DAILY FOR CHOLESTEROL. (Patient taking differently: TAKE 1 TABLET BY MOUTH ONCE DAILY AT NIGHT FOR CHOLESTEROL.) 30 tablet 11  . clopidogrel (PLAVIX) 75 MG tablet TAKE ONE TABLET BY MOUTH ONCE DAILY. 90 tablet 3  . fenofibrate (TRICOR) 145 MG tablet TAKE 1 TABLET BY MOUTH ONCE DAILY FOR CHOLESTEROL. 30 tablet 11  . metoprolol tartrate (LOPRESSOR) 25 MG  tablet TAKE 1 & 1/2 TABLETS BY MOUTH TWICE DAILY. 90 tablet 6  . Multiple Vitamin (ONE-A-DAY MENS PO) Take 1 tablet by mouth daily.    . pantoprazole (PROTONIX) 40 MG tablet TAKE 1 TABLET BY MOUTH ONCE DAILY FOR ACID REFLUX. 30 tablet 6  . polyethylene glycol (MIRALAX / GLYCOLAX) packet Take 17 g by mouth daily as needed for moderate constipation.    . ramipril (ALTACE) 2.5 MG capsule TAKE (1) CAPSULE BY MOUTH ONCE DAILY. (Patient taking differently: TAKE (1) CAPSULE BY MOUTH ONCE DAILY AT NIGHT) 30 capsule 11   No current facility-administered medications for this visit.    Facility-Administered Medications Ordered in Other Visits  Medication Dose Route Frequency Provider Last Rate Last Dose  . chlorhexidine (HIBICLENS) 4 % liquid 4 application  60 mL Topical Once Elam Dutch, MD       And  . Derrill Memo ON 01/06/2017] chlorhexidine (HIBICLENS) 4 % liquid 4 application  60 mL Topical Once  Elam Dutch, MD        ROS:   General:  No weight loss, Fever, chills  HEENT: No recent headaches, no nasal bleeding, no visual changes, no sore throat  Neurologic: No dizziness, blackouts, seizures. No recent symptoms of stroke or mini- stroke. No recent episodes of slurred speech, or temporary blindness.  Cardiac: No recent episodes of chest pain/pressure, no shortness of breath at rest.  No shortness of breath with exertion.  Denies history of atrial fibrillation or irregular heartbeat  Vascular: No history of rest pain in feet.  No history of claudication.  No history of non-healing ulcer, No history of DVT   Pulmonary: No home oxygen, no productive cough, no hemoptysis,  No asthma or wheezing  Musculoskeletal:  [ ]  Arthritis, [ ]  Low back pain,  [ ]  Joint pain  Hematologic:No history of hypercoagulable state.  No history of easy bleeding.  No history of anemia  Gastrointestinal: No hematochezia or melena,  No gastroesophageal reflux, no trouble swallowing  Urinary: [ ]  chronic Kidney disease, [ ]  on HD - [ ]  MWF or [ ]  TTHS, [ ]  Burning with urination, [ ]  Frequent urination, [ ]  Difficulty urinating;   Skin: No rashes  Psychological: No history of anxiety,  No history of depression   Physical Examination  Vitals:   01/05/17 1544  BP: 119/66  Pulse: 65  Resp: 16  Temp: 99.1 F (37.3 C)  TempSrc: Oral  SpO2: 93%  Weight: 164 lb (74.4 kg)  Height: 5\' 10"  (1.778 m)    Body mass index is 23.53 kg/m.  General:  Alert and oriented, no acute distress HEENT: Normal Neck: No bruit or JVD Pulmonary: Clear to auscultation bilaterally Cardiac: Regular Rate and Rhythm without murmur Abdomen: Soft, non-tender, non-distended Skin: No rash Extremity Pulses:  2+ radial, brachial, femoral, dorsalis pedis, posterior tibial pulses bilaterally Musculoskeletal: No deformity or edema  Neurologic: Upper and lower extremity motor 5/5 and symmetric  DATA: I reviewed the  patient's CT angiogram from December 2017. This shows an infrarenal abdominal aortic aneurysm 5 centimeter diameter with at least a 15 mm neck some tortuosity of the iliacs but overall seems to be fairly straightforward for aneurysm stent graft repair.  ASSESSMENT:  Asymptomatic 5 cm abdominal aortic aneurysm slowly growing over the last several years.   PLAN:  Endovascular stent graft repair with Gore Excluder stent graft next week plan is for percutaneous repair but I did discuss the patient today risk benefits possible complications including  but not limited to bleeding infection possible need for incision cutdown of both femoral arteries myocardial events 5% infection risk 1-2% he understands and agrees to proceed.   Ruta Hinds, MD [facility] Office: [phone] Pager: [phone]

## 2017-01-11 NOTE — Anesthesia Procedure Notes (Signed)
Procedure Name: Intubation Date/Time: 01/11/2017 1:20 PM Performed by: Teressa Lower Pre-anesthesia Checklist: Emergency Drugs available, Patient identified, Suction available and Timeout performed Patient Re-evaluated:Patient Re-evaluated prior to inductionOxygen Delivery Method: Circle system utilized Preoxygenation: Pre-oxygenation with 100% oxygen Intubation Type: IV induction Ventilation: Mask ventilation without difficulty Laryngoscope Size: Mac and 4 Grade View: Grade I Tube type: Oral Tube size: 7.5 mm Number of attempts: 1 Airway Equipment and Method: Stylet and Oral airway Placement Confirmation: ETT inserted through vocal cords under direct vision,  positive ETCO2 and breath sounds checked- equal and bilateral Secured at: 22 cm Tube secured with: Tape Dental Injury: Teeth and Oropharynx as per pre-operative assessment

## 2017-01-11 NOTE — Anesthesia Preprocedure Evaluation (Signed)
Anesthesia Evaluation  Patient identified by MRN, date of birth, ID band Patient awake    Reviewed: Allergy & Precautions, NPO status , Patient's Chart, lab work & pertinent test results  Airway Mallampati: II  TM Distance: >3 FB Neck ROM: Full    Dental no notable dental hx. (+) Teeth Intact   Pulmonary COPD, Current Smoker,    Pulmonary exam normal breath sounds clear to auscultation       Cardiovascular hypertension, Pt. on medications and Pt. on home beta blockers + CAD and + Peripheral Vascular Disease  Normal cardiovascular exam+ dysrhythmias  Rhythm:Regular Rate:Normal  AAA-5cm WPW- S/P failed ablation Systolic dysfunction LVEF 45-50% S/P CABG x7 1995, Cath 2007- occluded SVG-RCA, DES SVG-OM   Neuro/Psych negative neurological ROS  negative psych ROS   GI/Hepatic Neg liver ROS, GERD  Medicated and Controlled,  Endo/Other  Adrenal adenoma Hyperlipidemia  Renal/GU negative Renal ROS  negative genitourinary   Musculoskeletal   Abdominal   Peds  Hematology  (+) anemia , Plavix- last dose   Anesthesia Other Findings   Reproductive/Obstetrics                             Anesthesia Physical Anesthesia Plan  ASA: III  Anesthesia Plan: General   Post-op Pain Management:    Induction: Intravenous  Airway Management Planned: Oral ETT  Additional Equipment: Arterial line  Intra-op Plan:   Post-operative Plan: Extubation in OR  Informed Consent: I have reviewed the patients History and Physical, chart, labs and discussed the procedure including the risks, benefits and alternatives for the proposed anesthesia with the patient or authorized representative who has indicated his/her understanding and acceptance.   Dental advisory given  Plan Discussed with: Anesthesiologist, CRNA and Surgeon  Anesthesia Plan Comments:         Anesthesia Quick Evaluation

## 2017-01-11 NOTE — Interval H&P Note (Signed)
History and Physical Interval Note:  01/11/2017 8:40 AM  [name]  has presented today for surgery, with the diagnosis of AAA  I71.4  The various methods of treatment have been discussed with the patient and family. After consideration of risks, benefits and other options for treatment, the patient has consented to  Procedure(s): ABDOMINAL AORTIC ENDOVASCULAR STENT GRAFT (N/A) as a surgical intervention .  The patient's history has been reviewed, patient examined, no change in status, stable for surgery.  I have reviewed the patient's chart and labs.  Questions were answered to the patient's satisfaction.     Ruta Hinds

## 2017-01-12 ENCOUNTER — Ambulatory Visit: Payer: Medicare Other | Admitting: Vascular Surgery

## 2017-01-12 ENCOUNTER — Other Ambulatory Visit (HOSPITAL_COMMUNITY): Payer: Medicare Other

## 2017-01-12 ENCOUNTER — Encounter (HOSPITAL_COMMUNITY): Payer: Self-pay | Admitting: Vascular Surgery

## 2017-01-12 LAB — CBC
HEMATOCRIT: 39.6 % (ref 39.0–52.0)
HEMOGLOBIN: 12.9 g/dL — AB (ref 13.0–17.0)
MCH: 30.1 pg (ref 26.0–34.0)
MCHC: 32.6 g/dL (ref 30.0–36.0)
MCV: 92.5 fL (ref 78.0–100.0)
Platelets: 152 10*3/uL (ref 150–400)
RBC: 4.28 MIL/uL (ref 4.22–5.81)
RDW: 13.1 % (ref 11.5–15.5)
WBC: 7.4 10*3/uL (ref 4.0–10.5)

## 2017-01-12 LAB — BASIC METABOLIC PANEL
ANION GAP: 6 (ref 5–15)
BUN: 6 mg/dL (ref 6–20)
CHLORIDE: 105 mmol/L (ref 101–111)
CO2: 26 mmol/L (ref 22–32)
Calcium: 9.4 mg/dL (ref 8.9–10.3)
Creatinine, Ser: 1.01 mg/dL (ref 0.61–1.24)
GFR calc Af Amer: >60 mL/min (ref >60)
GLUCOSE: 92 mg/dL (ref 65–99)
POTASSIUM: 3.8 mmol/L (ref 3.5–5.1)
Sodium: 137 mmol/L (ref 135–145)

## 2017-01-12 MED ORDER — OXYCODONE-ACETAMINOPHEN 5-325 MG PO TABS: 1.0000 | ORAL_TABLET | Freq: Four times a day (QID) | ORAL | 0 refills | Status: DC | PRN

## 2017-01-12 NOTE — Care Management Note (Addendum)
Case Management Note  Patient Details  Name: [name] MRN: [id number] Date of Birth: 10/09/1950  Subjective/Objective:  POD 1 AAA Repair.  PTA indep.  Has [facility] Medicare insurance, For dc today, no needs.   PCP listed as  Sharilyn Sites                Action/Plan: NCM will follow for dc needs.  Expected Discharge Date:  01/12/17               Expected Discharge Plan:  Home/Self Care  In-House Referral:     Discharge planning Services  CM Consult  Post Acute Care Choice:    Choice offered to:     DME Arranged:    DME Agency:     HH Arranged:    HH Agency:     Status of Service:  Completed, signed off  If discussed at [employer] of Stay Meetings, dates discussed:    Additional Comments:  Zenon Mayo, RN 01/12/2017, 10:39 AM

## 2017-01-12 NOTE — Progress Notes (Addendum)
[facility] [place]  Subjective  - Doing great has not needed pain medication.   Objective 106/60 66 98.7 F (37.1 C) (Oral) (!) 21 94%  Intake/Output Summary (Last 24 hours) at 01/12/17 0511 Last data filed at 01/12/17 0700  Gross per 24 hour  Intake             1200 ml  Output             2130 ml  Net             -930 ml    Groins soft without hematoma Palpable pedal pulses B Abdomin soft NTTP Heart RRR Lungs non labored breathing  Assessment/Planning: POD # 1 Operative findings:              #1 Bilateral Proglide closure              #2 23 x 14 x 18 cm main body Gore Excluder device delivered via a right femoral system              #3 12 x 14 cm left iliac limb contralateral Voided, tolerating liquids fine Pending ambulation and tolerating breakfast he is stable for discharge home. F/U in our office in 4 weeks with CTA Abd/Pelvis.  Laurence Slate [facility] 01/12/2017 7:22 AM -- Agree with above.  Will d/c home today  Ruta Hinds, MD [facility] [place]: [phone] Pager: [phone]  Laboratory Lab Results:  Recent Labs  01/11/17 2021 01/12/17 0531  WBC 8.3 7.4  HGB 13.7 12.9*  HCT 41.1 39.6  PLT 140* 152   BMET  Recent Labs  01/11/17 2021 01/12/17 0531  NA 139 137  K 3.6 3.8  CL 108 105  CO2 25 26  GLUCOSE 83 92  BUN 7 6  CREATININE 1.00 1.01  CALCIUM 9.5 9.4    COAG Lab Results  Component Value Date   INR 1.13 01/11/2017   INR 1.05 01/05/2017   INR 0.96 07/13/2012   No results found for: PTT

## 2017-01-12 NOTE — Discharge Instructions (Signed)
Preventing Cerebrovascular Disease Arteries are blood vessels that carry blood that contains oxygen from the heart to all parts of the body. Cerebrovascular disease affects arteries that supply the brain. Any condition that blocks or disrupts blood flow to the brain can cause cerebrovascular disease. Brain cells that lose blood supply start to die within minutes (stroke). Stroke is the main danger of cerebrovascular disease. Atherosclerosis and high blood pressure are common causes of cerebrovascular disease. Atherosclerosis is narrowing and hardening of an artery that results when fat, cholesterol, calcium, or other substances (plaque) build up inside an artery. Plaque reduces blood flow through the artery. High blood pressure increases the risk of bleeding inside the brain. Making diet and lifestyle changes to prevent atherosclerosis and high blood pressure lowers your risk of cerebrovascular disease. What nutrition changes can be made?  Eat more fruits, vegetables, and whole grains.  Reduce how much saturated fat you eat. To do this, eat less red meat and fewer full-fat dairy products.  Eat healthy proteins instead of red meat. Healthy proteins include:  Fish. Eat fish that contains heart-healthy omega-3 fatty acids, twice a week. Examples include salmon, albacore tuna, mackerel, and herring.  Chicken.  Nuts.  Low-fat or nonfat yogurt.  Avoid processed meats, like bacon and lunchmeat.  Avoid foods that contain:  A lot of sugar, such as sweets and drinks with added sugar.  A lot of salt (sodium). Avoid adding extra salt to your food, as told by your health care provider.  Trans fats, such as margarine and baked goods. Trans fats may be listed as "partially hydrogenated oils" on food labels.  Check food labels to see how much sodium, sugar, and trans fats are in foods.  Use vegetable oils that contain low amounts of saturated fat, such as olive oil or canola oil. What lifestyle  changes can be made?  Drink alcohol in moderation. This means no more than 1 drink a day for nonpregnant women and 2 drinks a day for men. One drink equals 12 oz of beer, 5 oz of wine, or 1 oz of hard liquor.  If you are overweight, ask your health care provider to recommend a weight-loss plan for you. Losing 5-10 lb (2.2-4.5 kg) can reduce your risk of diabetes, atherosclerosis, and high blood pressure.  Exercise for 30?60 minutes on most days, or as much as told by your health care provider.  Do moderate-intensity exercise, such as brisk walking, bicycling, and water aerobics. Ask your health care provider which activities are safe for you.  Do not use any products that contain nicotine or tobacco, such as cigarettes and e-cigarettes. If you need help quitting, ask your health care provider. Why are these changes important? Making these changes lowers your risk of many diseases that can cause cerebrovascular disease and stroke. Stroke is a leading cause of death and disability. Making these changes also improves your overall health and quality of life. What can I do to lower my risk? The following factors make you more likely to develop cerebrovascular disease:  Being overweight.  Smoking.  Being physically inactive.  Eating a high-fat diet.  Having certain health conditions, such as:  Diabetes.  High blood pressure.  Heart disease.  Atherosclerosis.  High cholesterol.  Sickle cell disease. Talk with your health care provider about your risk for cerebrovascular disease. Work with your health care provider to control diseases that you have that may contribute to cerebrovascular disease. Your health care provider may prescribe medicines to help prevent major   causes of cerebrovascular disease. Where to find more information: Learn more about preventing cerebrovascular disease from:  National Heart, Lung, and Blood Institute:  www.nhlbi.nih.gov/health/health-topics/topics/stroke  Centers for Disease Control and Prevention: cdc.gov/stroke/about.htm Summary  Cerebrovascular disease can lead to a stroke.  Atherosclerosis and high blood pressure are major causes of cerebrovascular disease.  Making diet and lifestyle changes can reduce your risk of cerebrovascular disease.  Work with your health care provider to get your risk factors under control to reduce your risk of cerebrovascular disease. This information is not intended to replace advice given to you by your health care provider. Make sure you discuss any questions you have with your health care provider. Document Released: 08/23/2015 Document Revised: 02/26/2016 Document Reviewed: 08/23/2015 Elsevier Interactive Patient Education  2017 Elsevier Inc.  

## 2017-01-13 NOTE — Discharge Summary (Signed)
[facility] Discharge Summary   Patient ID:  [name] MRN: [id number] DOB/AGE: 1951/06/09 66 y.o.  Admit date: 01/11/2017 Discharge date: 01/12/2017 Date of Surgery: 01/11/2017 Surgeon: Surgeon(s): Elam Dutch, MD  Admission Diagnosis: AAA  I71.4  Discharge Diagnoses:  AAA  I71.4  Secondary Diagnoses: Past Medical History:  Diagnosis Date  . Abdominal aortic aneurysm ([place])   . Adrenal adenoma   . COPD (chronic obstructive pulmonary disease) ([place])   . Coronary artery disease    Multivessel, LVEF 50%, occluded SVG to RCA, DES SVG to OM system 2007  . Essential hypertension   . Hyperlipidemia   . Noncompliance   . Syncope    Acute gastroenteritis; vasovagal  . Systolic dysfunction    EF 45-50%  . Vitamin B12 deficiency anemia   . Wolff-Parkinson-White (WPW) syndrome    Failed ablation    Procedure(s): ABDOMINAL AORTIC ENDOVASCULAR STENT GRAFT  Discharged Condition: good  HPI: [name] is a 66 y.o. male with a known 5 cm abdominal aortic aneurysm.  It was 80 m in diameter in 2013. It has been slowly growing. He continues to deny any abdominal or back pain. He has no chest pain or shortness of breath. He recently had a cardiac stress test done which showed moderate risk. This was reviewed by Dr. [name] and since the patient is asymptomatic he thought he would be a reasonable risk for aneurysm stent graft repair. The patient's Plavix has been stopped. He is on aspirin.   DATA: I reviewed the patient's CT angiogram from December 2017. This shows an infrarenal abdominal aortic aneurysm 5 centimeter diameter with at least a 15 mm neck some tortuosity of the iliacs but overall seems to be fairly straightforward for aneurysm stent graft repair.  ASSESSMENT:  Asymptomatic 5 cm abdominal aortic aneurysm slowly growing over the last several years.   PLAN:  Endovascular stent graft repair with Gore Excluder stent graft next week plan is for  percutaneous repair.  Dr. Oneida Alar did discuss risk benefits possible complications including but not limited to bleeding infection possible need for incision cutdown of both femoral arteries myocardial events 5% infection risk 1-2% he understands and agrees to proceed.   Hospital Course:  [name] is a 66 y.o. male is S/P  Procedure(s): ABDOMINAL AORTIC ENDOVASCULAR STENT GRAFT POD # 1 Operative findings: #1 Bilateral Proglide closure  #2 23 x 14 x 18cm main body Gore Excluder device delivered via a right femoral system  #3 12x 14cm left iliac limb contralateral Voided, tolerating liquids fine Pending ambulation and tolerating breakfast he is stable for discharge home. F/U in our office in 4 weeks with CTA Abd/Pelvis.  Discharge home in stable condition. Significant Diagnostic Studies: CBC Lab Results  Component Value Date   WBC 7.4 01/12/2017   HGB 12.9 (L) 01/12/2017   HCT 39.6 01/12/2017   MCV 92.5 01/12/2017   PLT 152 01/12/2017    BMET    Component Value Date/Time   NA 137 01/12/2017 0531   K 3.8 01/12/2017 0531   CL 105 01/12/2017 0531   CO2 26 01/12/2017 0531   GLUCOSE 92 01/12/2017 0531   BUN 6 01/12/2017 0531   CREATININE 1.01 01/12/2017 0531   CREATININE 1.02 02/20/2015 1016   CALCIUM 9.4 01/12/2017 0531   GFRNONAA >60 01/12/2017 0531   GFRAA >60 01/12/2017 0531   COAG Lab Results  Component Value Date   INR 1.13 01/11/2017   INR 1.05 01/05/2017  INR 0.96 07/13/2012     Disposition:  Discharge to :Home Discharge Instructions    Call MD for:  redness, tenderness, or signs of infection (pain, swelling, bleeding, redness, odor or green/yellow discharge around incision site)    Complete by:  As directed    Call MD for:  severe or increased pain, loss or decreased feeling  in affected limb(s)    Complete by:  As directed    Call MD for:  temperature >100.5    Complete by:  As directed    Discharge  instructions    Complete by:  As directed    In 24 hours you may remove the groin dressings and shower daily as needed. Slowly increase daily activities as you tolerate.   Driving Restrictions    Complete by:  As directed    No driving for 1 week   Increase activity slowly    Complete by:  As directed    Walk with assistance use walker or cane as needed   Lifting restrictions    Complete by:  As directed    No heavy lifting for 3 weeks   Resume previous diet    Complete by:  As directed      Allergies as of 01/12/2017      Reactions   Codeine Nausea And Vomiting, Other (See Comments)   "Passed out" >> ? SYNCOPE ?      Medication List    TAKE these medications   ALPRAZolam 0.5 MG tablet Commonly known as:  XANAX Take 0.25 mg by mouth at bedtime as needed for sleep.   aspirin 325 MG tablet Take 325 mg by mouth at bedtime.   atorvastatin 80 MG tablet Commonly known as:  LIPITOR TAKE 1 TABLET BY MOUTH ONCE DAILY FOR CHOLESTEROL. What changed:  See the new instructions.   clopidogrel 75 MG tablet Commonly known as:  PLAVIX TAKE ONE TABLET BY MOUTH ONCE DAILY.   fenofibrate 145 MG tablet Commonly known as:  TRICOR TAKE 1 TABLET BY MOUTH ONCE DAILY FOR CHOLESTEROL.   metoprolol tartrate 25 MG tablet Commonly known as:  LOPRESSOR TAKE 1 & 1/2 TABLETS BY MOUTH TWICE DAILY.   ONE-A-DAY MENS PO Take 1 tablet by mouth daily.   oxyCODONE-acetaminophen 5-325 MG tablet Commonly known as:  PERCOCET/ROXICET Take 1 tablet by mouth every 6 (six) hours as needed for moderate pain.   pantoprazole 40 MG tablet Commonly known as:  PROTONIX TAKE 1 TABLET BY MOUTH ONCE DAILY FOR ACID REFLUX.   polyethylene glycol packet Commonly known as:  MIRALAX / GLYCOLAX Take 17 g by mouth daily as needed for moderate constipation.   ramipril 2.5 MG capsule Commonly known as:  ALTACE TAKE (1) CAPSULE BY MOUTH ONCE DAILY. What changed:  See the new instructions.      Verbal and written  Discharge instructions given to the patient. Wound care per Discharge AVS   Signed: Laurence Slate [facility] 01/13/2017, 2:34 PM - For VQI Registry use --- Instructions: Press F2 to tab through selections.  Delete question if not applicable.   Post-op:  Time to Extubation: [x ] In OR, [ ]  < 12 hrs, [ ]  12-24 hrs, [ ]  >=24 hrs Vasopressors Req. Post-op: No MI: [x ] No, [ ]  Troponin only, [ ]  EKG or Clinical New Arrhythmia: No CHF: No ICU Stay: 1 days Transfusion: No  If yes, 0 units given  Complications: Resp failure: [x ] none, [ ]  Pneumonia, [ ]  Ventilator Chg in renal  function: [x ] none, [ ]  Inc. Cr > 0.5, [ ]  Temp. Dialysis, [ ]  Permanent dialysis Leg ischemia: [x ] No, [ ]  Yes, no Surgery needed, [ ]  Yes, Surgery needed, [ ]  Amputation Bowel ischemia: [ x] No, [ ]  Medical Rx, [ ]  Surgical Rx Wound complication: [ x] No, [ ]  Superficial separation/infection, [ ]  Return to OR Return to OR: No  Return to OR for bleeding: No Stroke: [x ] None, [ ]  Minor, [ ]  Major  Discharge medications: Statin use:  Yes ASA use:  Yes Plavix use:  Yes Beta blocker use:  Yes

## 2017-02-01 ENCOUNTER — Telehealth: Payer: Self-pay | Admitting: Vascular Surgery

## 2017-02-01 ENCOUNTER — Other Ambulatory Visit: Payer: Self-pay

## 2017-02-01 DIAGNOSIS — I714 Abdominal aortic aneurysm, without rupture, unspecified: Secondary | ICD-10-CM

## 2017-02-01 NOTE — Telephone Encounter (Signed)
Pt called b/c he hadn't heard from the office after his EVAR. There was no staff message but in the discharge notes he is supposed to have a CTA + MD 4w post surgery. Sched CTA at [facility] 02/17/17 at 2:00 and [facility] 02/23/17 at 10:15. Spoke to pt to inform them of appts.

## 2017-02-02 ENCOUNTER — Other Ambulatory Visit (HOSPITAL_COMMUNITY): Payer: Medicare Other

## 2017-02-02 ENCOUNTER — Ambulatory Visit: Payer: Medicare Other | Admitting: Family

## 2017-02-03 DIAGNOSIS — N390 Urinary tract infection, site not specified: Secondary | ICD-10-CM | POA: Diagnosis not present

## 2017-02-03 DIAGNOSIS — R350 Frequency of micturition: Secondary | ICD-10-CM | POA: Diagnosis not present

## 2017-02-03 DIAGNOSIS — N342 Other urethritis: Secondary | ICD-10-CM | POA: Diagnosis not present

## 2017-02-03 DIAGNOSIS — Z6824 Body mass index (BMI) 24.0-24.9, adult: Secondary | ICD-10-CM | POA: Diagnosis not present

## 2017-02-03 DIAGNOSIS — R319 Hematuria, unspecified: Secondary | ICD-10-CM | POA: Diagnosis not present

## 2017-02-09 ENCOUNTER — Encounter: Payer: Self-pay | Admitting: Vascular Surgery

## 2017-02-17 ENCOUNTER — Ambulatory Visit (HOSPITAL_COMMUNITY): Payer: Medicare Other

## 2017-02-20 DIAGNOSIS — Z6824 Body mass index (BMI) 24.0-24.9, adult: Secondary | ICD-10-CM | POA: Diagnosis not present

## 2017-02-20 DIAGNOSIS — Z1389 Encounter for screening for other disorder: Secondary | ICD-10-CM | POA: Diagnosis not present

## 2017-02-20 DIAGNOSIS — N390 Urinary tract infection, site not specified: Secondary | ICD-10-CM | POA: Diagnosis not present

## 2017-02-20 DIAGNOSIS — I10 Essential (primary) hypertension: Secondary | ICD-10-CM | POA: Diagnosis not present

## 2017-02-20 DIAGNOSIS — E782 Mixed hyperlipidemia: Secondary | ICD-10-CM | POA: Diagnosis not present

## 2017-02-20 DIAGNOSIS — N342 Other urethritis: Secondary | ICD-10-CM | POA: Diagnosis not present

## 2017-02-20 DIAGNOSIS — R319 Hematuria, unspecified: Secondary | ICD-10-CM | POA: Diagnosis not present

## 2017-02-21 ENCOUNTER — Ambulatory Visit (HOSPITAL_COMMUNITY): Payer: Medicare Other

## 2017-02-23 ENCOUNTER — Encounter: Payer: Medicare Other | Admitting: Vascular Surgery

## 2017-03-01 ENCOUNTER — Encounter: Payer: Self-pay | Admitting: Vascular Surgery

## 2017-03-02 IMAGING — CT CT CTA ABD/PEL W/CM AND/OR W/O CM
3 of 10 series · 11 of 46 positions shown, 17 images · IV contrast (Omnipaque 300)
Comparison: 07/14/1999 [DATE]

CLINICAL DATA: Atherosclerotic abdominal aortic aneurysm.

EXAM:
CT ANGIOGRAPHY ABDOMEN AND PELVIS
TECHNIQUE: Multidetector CT imaging of the abdomen and pelvis was performed
using the standard protocol during bolus administration of
intravenous contrast. Multiplanar reconstructed images including
MIPs were obtained and reviewed to evaluate the vascular anatomy.
CONTRAST:  100 cc Omnipaque 350

[Series 5: arterial 3.0 b30f · axial · arterial · 0.75mm/px · z∈[-439,-385]mm · 2 of 141 slices shown]
[im 9/141  soft-tissue]
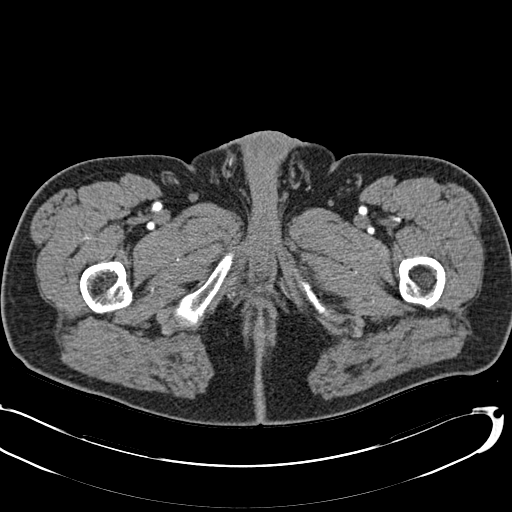
[im 27/141  soft-tissue]
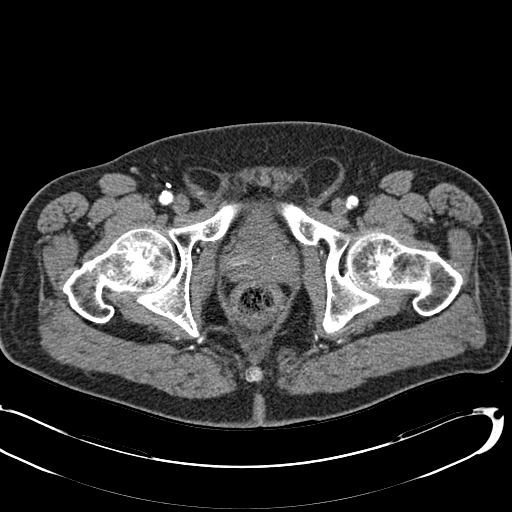

[Series 6: mpr cor post contrast · coronal · 0.75mm/px · 1 of 95 slices shown, 2 images]
[im 48/95  soft-tissue]
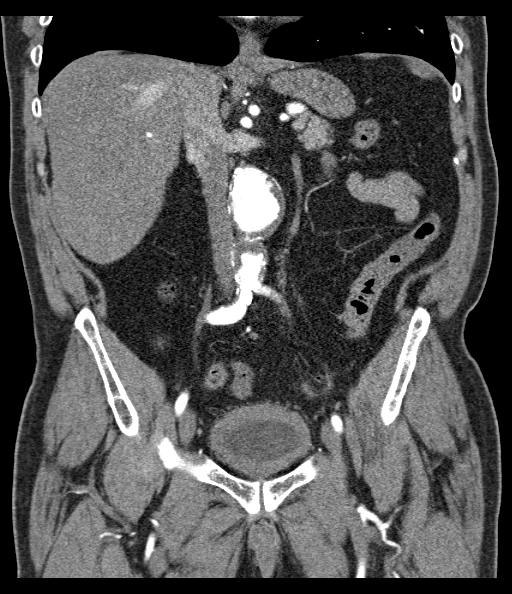
[im 48/95  bone]
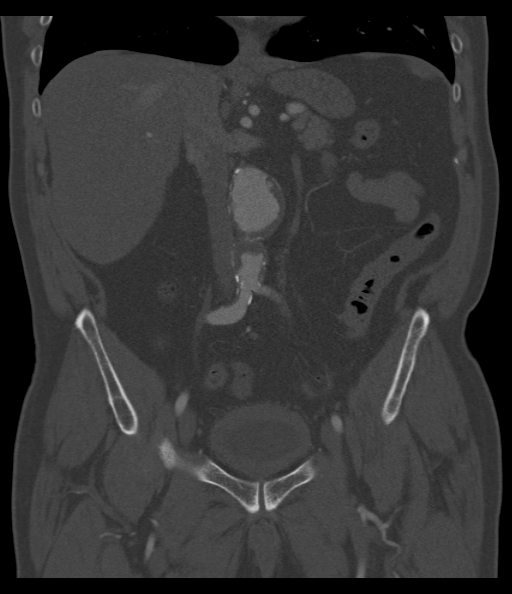

[Series 10: venous 5.0 b30f · axial · portal-venous · 0.75mm/px · z∈[-418,-93]mm · 8 of 85 slices shown, 13 images]
[im 10/85  soft-tissue]
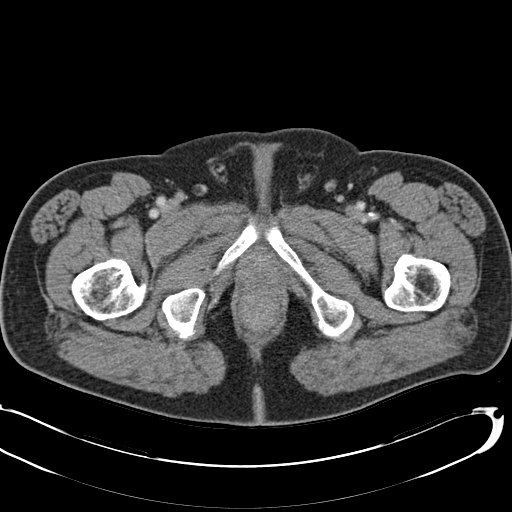
[im 10/85  bone]
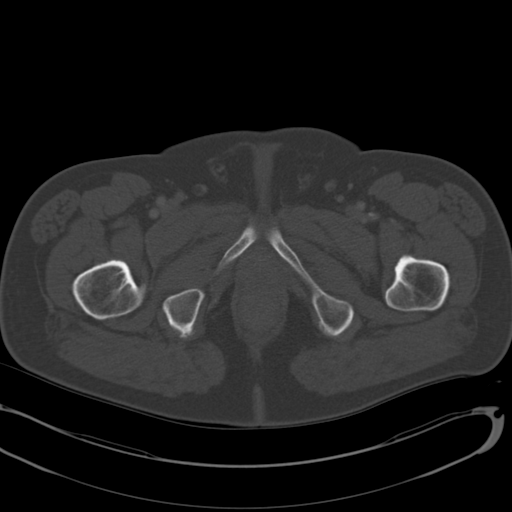
[im 19/85  soft-tissue]
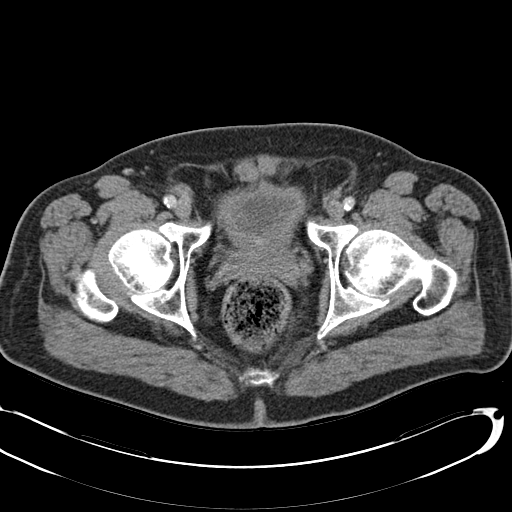
[im 29/85  soft-tissue]
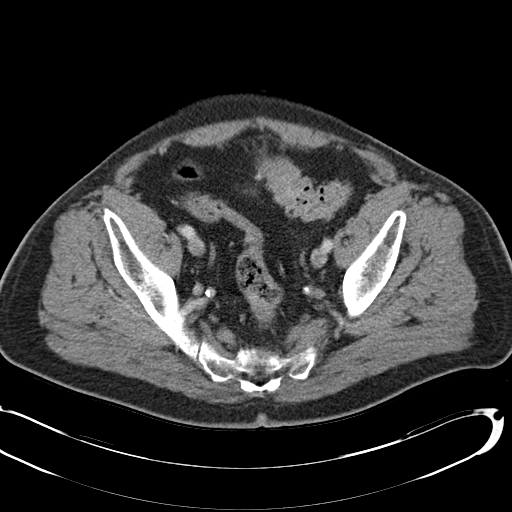
[im 38/85  soft-tissue]
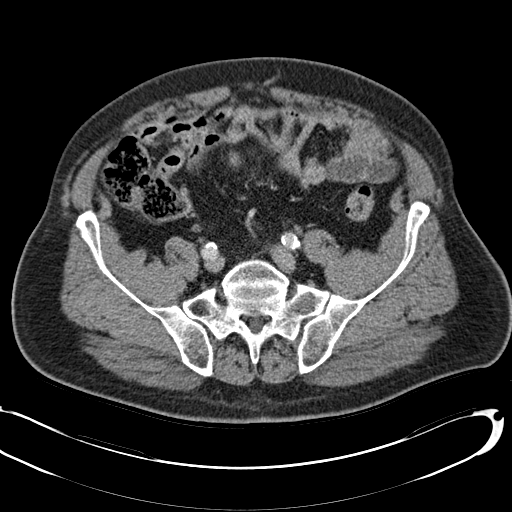
[im 47/85  soft-tissue]
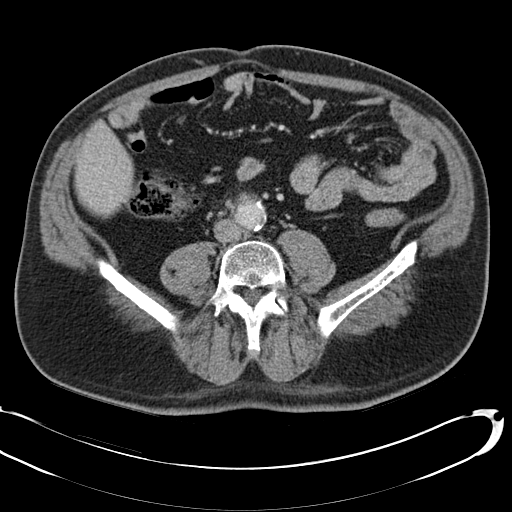
[im 47/85  lung]
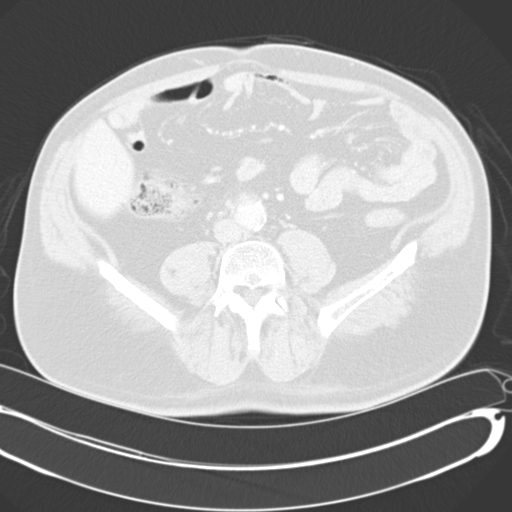
[im 57/85  soft-tissue]
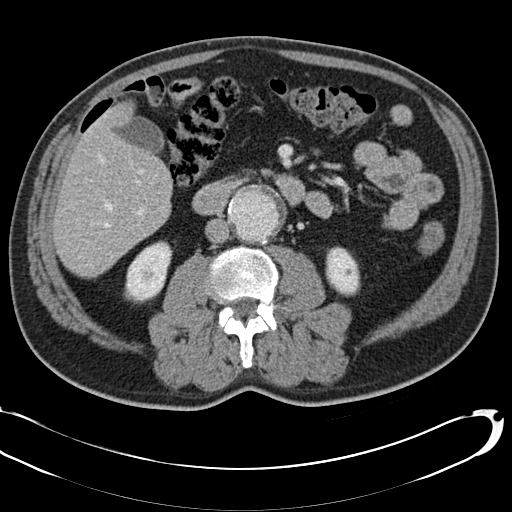
[im 57/85  lung]
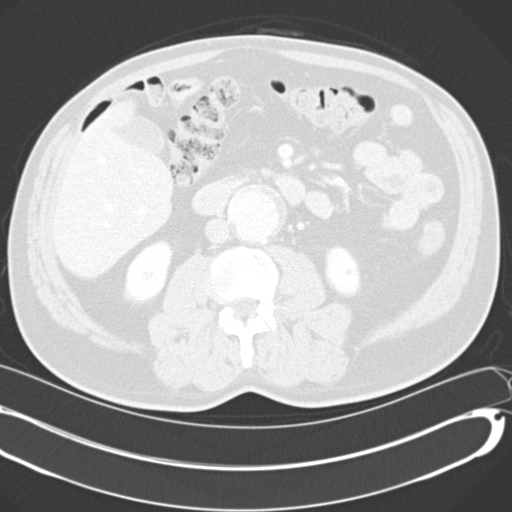
[im 66/85  soft-tissue]
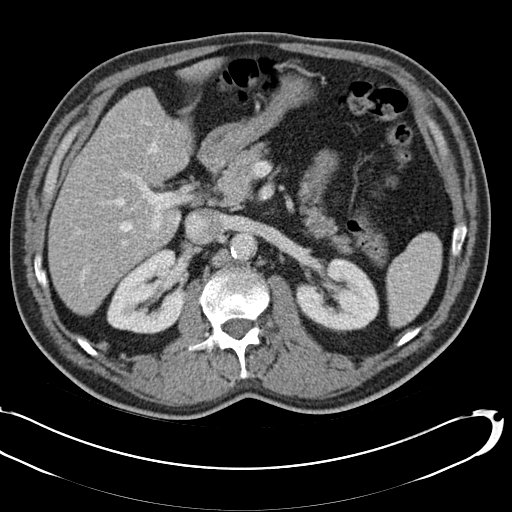
[im 66/85  lung]
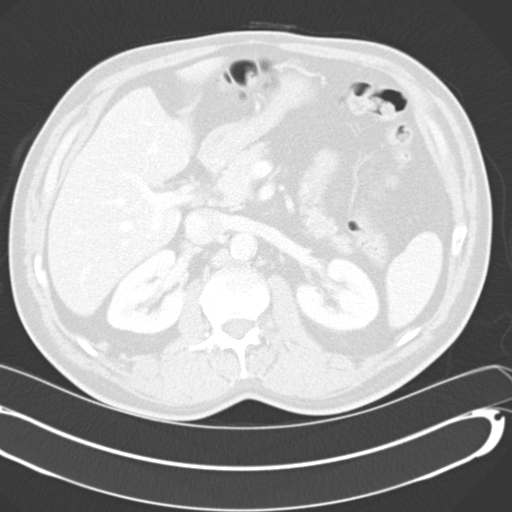
[im 75/85  soft-tissue]
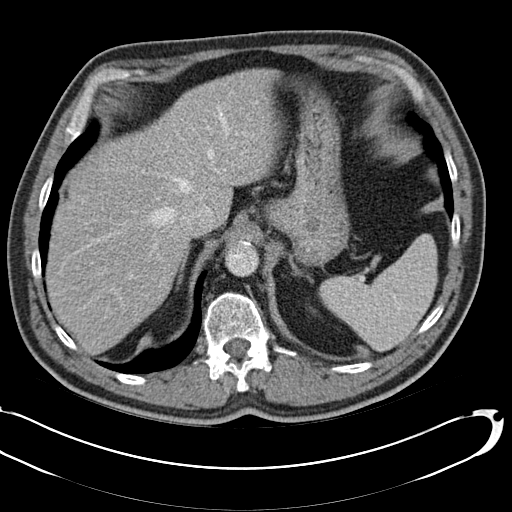
[im 75/85  lung]
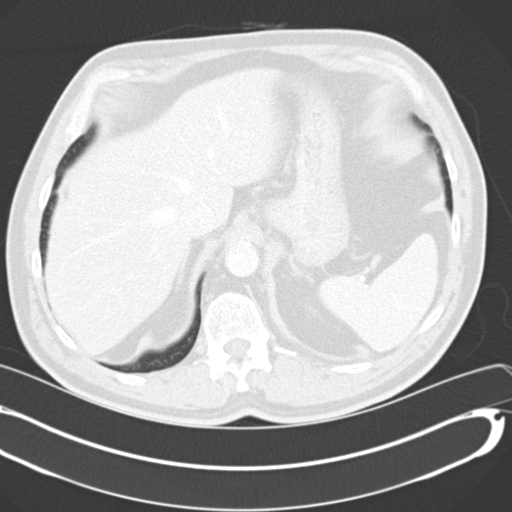

[11 of 46 positions shown; findings below may reference images not displayed]

FINDINGS: Arterial findings:

Aorta: Atherosclerotic fusiform infrarenal aneurysm again noted of
the abdominal aorta. Maximal AP diameter 4.6 cm and transverse
diameter 4.5 cm, image 49 series 5. Previously, the aneurysm
measured 4 cm by CT on 07/13/2012. No associated dissection,
retroperitoneal hemorrhage, or occlusive process.

Celiac axis: Minor atherosclerosis of the origin but remains patent.
Left gastric artery originates directly off of the aorta, a normal
variant.

Superior mesenteric: Minor atherosclerotic origin but remains
patent. Main SMA trunk is patent throughout the mesentery.

Left renal: Widely patent main left renal artery. No accessory renal
vasculature.

Right renal: Widely patent right renal artery. No accessory renal
artery.

Inferior mesenteric: Mild narrowing of the origin but remains patent
off of the anterior aneurysm sac.

Left iliac: Left common, internal and external iliac arteries are
atherosclerotic and tortuous but remain patent. No iliac occlusive
process, dissection or aneurysm.

Right iliac: Right common, internal and external iliac arteries are
atherosclerotic and tortuous but remain patent. No occlusive
process.

Venous findings: Hepatic veins, portal, splenic and mesenteric
veins, and IVC all appear patent. Renal veins are patent. No
Dominga process.

Review of the MIP images confirms the above findings.

Nonvascular findings: Clear lung bases. Normal heart size. No
pericardial or pleural effusion.

Liver, gallbladder, biliary system, pancreas, spleen, and adrenal
glands are within normal limits for age and demonstrate no acute
process. Stable small 15 mm right adrenal adenoma.

Negative for bowel obstruction, dilatation, ileus, or free air.

No abdominal free fluid, fluid collection, hemorrhage, abscess, or
adenopathy.

No pelvic free fluid, fluid collection, hemorrhage, abscess or
adenopathy. Scattered colonic diverticulosis without acute
inflammatory process. Diffuse wall thickening of the bladder,
nonspecific but can be seen with cystitis. Postop changes in the
right hemipelvis.

No acute osseous finding.  Minor degenerative changes of the spine.
IMPRESSION: 4.6 x 4.5 cm atherosclerotic fusiform infrarenal aneurysm of the
abdominal aorta. Previous measurement by CT was 4 cm.

Recommend followup by abdomen and pelvis CTA in 6 months, and
vascular surgery referral/consultation if not already obtained. This
recommendation follows ACR consensus guidelines: White Paper of the
ACR Incidental Findings Committee II on Vascular Findings. [HOSPITAL] 6605; [DATE].

## 2017-03-09 ENCOUNTER — Encounter: Payer: Self-pay | Admitting: Vascular Surgery

## 2017-03-09 ENCOUNTER — Ambulatory Visit (INDEPENDENT_AMBULATORY_CARE_PROVIDER_SITE_OTHER): Payer: Self-pay | Admitting: Vascular Surgery

## 2017-03-09 ENCOUNTER — Encounter (HOSPITAL_COMMUNITY): Payer: Self-pay

## 2017-03-09 ENCOUNTER — Ambulatory Visit (HOSPITAL_COMMUNITY)
Admission: RE | Admit: 2017-03-09 | Discharge: 2017-03-09 | Disposition: A | Discharge status: Home / Self Care | Payer: Medicare Other | Source: Ambulatory Visit | Attending: Vascular Surgery | Admitting: Vascular Surgery

## 2017-03-09 VITALS — BP 109/69 | HR 81 | Temp 98.9°F | Resp 16 | Ht 70.0 in | Wt 162.6 lb

## 2017-03-09 DIAGNOSIS — I714 Abdominal aortic aneurysm, without rupture, unspecified: Secondary | ICD-10-CM

## 2017-03-09 DIAGNOSIS — N400 Enlarged prostate without lower urinary tract symptoms: Secondary | ICD-10-CM | POA: Insufficient documentation

## 2017-03-09 DIAGNOSIS — Z95828 Presence of other vascular implants and grafts: Secondary | ICD-10-CM | POA: Diagnosis not present

## 2017-03-09 LAB — POCT I-STAT CREATININE: Creatinine, Ser: 1.1 mg/dL (ref 0.61–1.24)

## 2017-03-09 MED ORDER — IOPAMIDOL (ISOVUE-370) INJECTION 76%
100.0000 mL | Freq: Once | INTRAVENOUS | Status: AC | PRN
  Administered 2017-03-09: 100 mL via INTRAVENOUS

## 2017-03-09 NOTE — Progress Notes (Signed)
Patient is a 66 year old male who returns for follow-up after placement of a Gore Excluder stent graft for abdominal aortic aneurysm05/23/2018.aneurysm diameter preop was 5 cm. He states he has recovered from his procedure. He denies any abdominal or back pain. He has been treated for urinary tract infection by his primary care physician. He is on a second course of Cipro currently. He states that he thinks the symptoms may be getting better.he denies any fever or chills. His only symptom has been dysuria.  Physical exam:  Vitals:   03/09/17 1233  BP: 109/69  Pulse: 81  Resp: 16  Temp: 98.9 F (37.2 C)  TempSrc: Oral  SpO2: 93%  Weight: 162 lb 9.6 oz (73.8 kg)  Height: 5\' 10"  (1.778 m)    Extremities: 2+ femoral pulses 2+ dorsalis pedis pulses bilaterally  Abdomen: Soft nontender no pulsatile mass  Data: Patient had a CT angiogram of the abdomen and pelvis earlier today. This shows an aneurysm diameter of 5.5 x 5.1 cm. 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 Left and right renal arteries are widely patent. Stent is in good position otherwise.  Assessment: Doing well status post aneurysm stent graft exclusion of abdominal aortic aneurysm. I discussed the patient today the natural history of type II endoleak's which are usually benign but again emphasized to him that he will need continued observation intermittent CT scans. He'll return in 6 months time for repeat CT angiogram the abdomen and pelvis. As long as the aneurysm does not expand over time he would not need an intervention for the type II endoleak.  Plan: See above  Ruta Hinds, MD [facility] [place] Office: [phone] Pager: [phone]

## 2017-03-13 NOTE — Addendum Note (Signed)
Addended by: Lianne Cure A on: 03/13/2017 03:06 PM   Modules accepted: Orders

## 2017-03-16 ENCOUNTER — Ambulatory Visit: Payer: Medicare Other | Admitting: Internal Medicine

## 2017-04-20 ENCOUNTER — Other Ambulatory Visit: Payer: Self-pay | Admitting: Cardiology

## 2017-06-12 NOTE — Progress Notes (Signed)
Cardiology Office Note  Date: 06/13/2017   ID: SIDDIQ KALUZNY, DOB [birth year], MRN [id number]  PCP: Sharilyn Sites, MD  Primary Cardiologist: Rozann Lesches, MD   Chief Complaint  Patient presents with  . Coronary Artery Disease    History of Present Illness: [name] is a 66 y.o. male last seen by Ms. Lawrence DNP in December 2017. He presents for a routine follow-up visit. From a cardiac perspective he reports no angina symptoms or increasing shortness of breath. He states that he has been compliant with his medications.  Patient underwent stent graft repair of AAA in May of this year with Dr. Oneida Alar. Recent CT imaging is noted below.  Follow-up Myoview in April of this year showed a large region of scar in the inferior/lateral distribution with mild peri-infarct ischemia and LVEF 55%. He has been managed medically.  He underwent labwork at [place] earlier in the year, we are requesting his most recent lipid panel. He reports no intolerances on high-dose Lipitor.  Past Medical History:  Diagnosis Date  . Abdominal aortic aneurysm ([place])   . Adrenal adenoma   . COPD (chronic obstructive pulmonary disease) ([place])   . Coronary artery disease    Multivessel, LVEF 50%, occluded SVG to RCA, DES SVG to OM system 2007  . Essential hypertension   . Hyperlipidemia   . Noncompliance   . Syncope    Acute gastroenteritis; vasovagal  . Systolic dysfunction    EF 45-50%  . Vitamin B12 deficiency anemia   . Wolff-Parkinson-White (WPW) syndrome    Failed ablation    Past Surgical History:  Procedure Laterality Date  . ABDOMINAL AORTIC ENDOVASCULAR STENT GRAFT N/A 01/11/2017   Procedure: ABDOMINAL AORTIC ENDOVASCULAR STENT GRAFT;  Surgeon: Elam Dutch, MD;  Location: [place];  Service: Vascular;  Laterality: N/A;  . BLADDER SURGERY    . CORONARY ANGIOPLASTY WITH STENT PLACEMENT    . CORONARY ARTERY BYPASS GRAFT  1995   LIMA to LAD , SVG to diagonal, SVG to OM1 and OM2 ,  SVG to RCA     Current Outpatient Prescriptions  Medication Sig Dispense Refill  . ALPRAZolam (XANAX) 0.5 MG tablet Take 0.25 mg by mouth at bedtime as needed for sleep.    Marland Kitchen aspirin 325 MG tablet Take 325 mg by mouth at bedtime.     Marland Kitchen atorvastatin (LIPITOR) 80 MG tablet TAKE 1 TABLET BY MOUTH ONCE DAILY FOR CHOLESTEROL. (Patient taking differently: TAKE 1 TABLET BY MOUTH ONCE DAILY AT NIGHT FOR CHOLESTEROL.) 30 tablet 11  . clopidogrel (PLAVIX) 75 MG tablet TAKE ONE TABLET BY MOUTH ONCE DAILY. 90 tablet 3  . fenofibrate (TRICOR) 145 MG tablet TAKE 1 TABLET BY MOUTH ONCE DAILY FOR CHOLESTEROL. 30 tablet 11  . Magnesium 400 MG TABS Take by mouth.    . metoprolol tartrate (LOPRESSOR) 25 MG tablet TAKE 1 & 1/2 TABLETS BY MOUTH TWICE DAILY. 90 tablet 6  . pantoprazole (PROTONIX) 40 MG tablet TAKE 1 TABLET BY MOUTH ONCE DAILY FOR ACID REFLUX. 30 tablet 6  . polyethylene glycol (MIRALAX / GLYCOLAX) packet Take 17 g by mouth daily as needed for moderate constipation.    . ramipril (ALTACE) 2.5 MG capsule TAKE (1) CAPSULE BY MOUTH ONCE DAILY. (Patient taking differently: TAKE (1) CAPSULE BY MOUTH ONCE DAILY AT NIGHT) 30 capsule 11  . nitroGLYCERIN (NITROSTAT) 0.4 MG SL tablet Place 1 tablet (0.4 mg total) under the tongue every 5 (five) minutes as needed. 25 tablet 3  No current facility-administered medications for this visit.    Allergies:  Codeine   Social History: The patient  reports that he has been smoking Cigarettes.  He started smoking about 48 years ago. He has a 40.00 pack-year smoking history. He has never used smokeless tobacco. He reports that he does not drink alcohol or use drugs.   ROS:  Please see the history of present illness. Otherwise, complete review of systems is positive for generalized fatigue, hip and knee arthritic pain.  All other systems are reviewed and negative.   Physical Exam: VS:  BP 134/72   Pulse 61   Wt 165 lb (74.8 kg)   SpO2 93%   BMI 23.68 kg/m , BMI  Body mass index is 23.68 kg/m.  Wt Readings from Last 3 Encounters:  06/13/17 165 lb (74.8 kg)  03/09/17 162 lb 9.6 oz (73.8 kg)  01/11/17 159 lb 11.2 oz (72.4 kg)    General: Patient appears comfortable at rest. HEENT: Conjunctiva and lids normal, oropharynx clear. Neck: Supple, no elevated JVP or carotid bruits, no thyromegaly. Lungs: Clear to auscultation, nonlabored breathing at rest. Cardiac: Regular rate and rhythm, no S3 or significant systolic murmur, no pericardial rub. Abdomen: Soft, nontender, bowel sounds present, no guarding or rebound. Extremities: No pitting edema, distal pulses 2+. Skin: Warm and dry. Musculoskeletal: No kyphosis. Neuropsychiatric: Alert and oriented x3, affect grossly appropriate.  ECG: I personally reviewed the tracing from 08/30/2016 which showed sinus rhythm with left anterior fascicular block versus old inferior infarct pattern and nonspecific ST-T changes.  Recent Labwork: 07/31/2016: TSH 1.243 01/05/2017: ALT 23; AST 24 01/11/2017: Magnesium 1.6 01/12/2017: BUN 6; Hemoglobin 12.9; Platelets 152; Potassium 3.8; Sodium 137 03/09/2017: Creatinine, Ser 1.10   Other Studies Reviewed Today:  Lexiscan Myoview 11/29/2016:  There was no ST segment deviation noted during stress.  T wave inversion was noted during stress in the II, III, aVF and V6 leads.  Defect 1: There is a large defect of severe severity present in the basal inferoseptal, basal inferior, basal inferolateral, mid inferoseptal, mid inferior and mid inferolateral location.  Findings consistent with prior myocardial infarction with a mild degree of peri-infarct ischemia in the inferoseptal wall.  This is an intermediate risk study.  Nuclear stress EF: 55%.  Abdominal and pelvic CT 03/09/2017: IMPRESSION: VASCULAR  Status post aorto bi-iliac stent graft since the prior study. There is a type 2 endoleak. Maximal sac diameter has increased from 5.0 cm to 5.5 cm. This is the first  postop imaging study.  NON-VASCULAR  No acute intra-abdominal or pelvic pathology.  Bladder wall thickening has increased since the prior study. An inflammatory process superimposed upon bladder outlet obstruction cannot be excluded. Correlation with urinalysis may be helpful.  Prostate remains enlarged.  Correlate with PSA and physical exam.  Chronic benign findings are noted.  Assessment and Plan:  1. Multivessel CAD status post CABG with graft disease and previous DES to the SVG to OM in 2007. Myoview study from earlier this year revealed a relatively large inferolateral infarct scar with mild peri-infarct ischemia. He does not report progressive angina on medical therapy and we will continue with observation for now.  2. Abdominal aortic aneurysm status post stent graft repair by Dr. Oneida Alar in May.  3. Hyperlipidemia, kidneys on high-dose Lipitor. Requesting most recent lipid panel from [facility].  4. Tobacco abuse. We have discussed smoking cessation. He has not been able to quit.  5. Essential hypertension, no changes made to present regimen.  Current medicines were reviewed with the patient today.  Disposition: Follow-up in 6 months.  Signed, Satira Sark, MD, [facility] 06/13/2017 11:26 AM    [facility] [place]. [address] [place], [place] [address] Phone: [phone]; Fax: [phone]

## 2017-06-13 ENCOUNTER — Ambulatory Visit (INDEPENDENT_AMBULATORY_CARE_PROVIDER_SITE_OTHER): Payer: Medicare Other | Admitting: Cardiology

## 2017-06-13 ENCOUNTER — Encounter: Payer: Self-pay | Admitting: Cardiology

## 2017-06-13 VITALS — BP 134/72 | HR 61 | Wt 165.0 lb

## 2017-06-13 DIAGNOSIS — I714 Abdominal aortic aneurysm, without rupture, unspecified: Secondary | ICD-10-CM

## 2017-06-13 DIAGNOSIS — I10 Essential (primary) hypertension: Secondary | ICD-10-CM | POA: Diagnosis not present

## 2017-06-13 DIAGNOSIS — I2510 Atherosclerotic heart disease of native coronary artery without angina pectoris: Secondary | ICD-10-CM | POA: Diagnosis not present

## 2017-06-13 DIAGNOSIS — E782 Mixed hyperlipidemia: Secondary | ICD-10-CM

## 2017-06-13 DIAGNOSIS — Z720 Tobacco use: Secondary | ICD-10-CM | POA: Diagnosis not present

## 2017-06-13 MED ORDER — NITROGLYCERIN 0.4 MG SL SUBL: 0.4000 mg | SUBLINGUAL_TABLET | SUBLINGUAL | 3 refills | Status: DC | PRN

## 2017-06-13 NOTE — Patient Instructions (Signed)
Your physician wants you to follow-up in:6 months with Dr.McDowell You will receive a reminder letter in the mail two months in advance. If you don't receive a letter, please call our office to schedule the follow-up appointment.   Your physician recommends that you continue on your current medications as directed. Please refer to the Current Medication list given to you today.   If you need a refill on your cardiac medications before your next appointment, please call your pharmacy.    No lab work or tests ordered today.      Thank you for choosing [facility] !         

## 2017-06-22 ENCOUNTER — Other Ambulatory Visit: Payer: Self-pay | Admitting: Cardiology

## 2017-07-24 ENCOUNTER — Other Ambulatory Visit: Payer: Self-pay | Admitting: Cardiology

## 2017-09-08 ENCOUNTER — Ambulatory Visit (HOSPITAL_COMMUNITY): Payer: Medicare Other

## 2017-09-14 ENCOUNTER — Ambulatory Visit: Payer: Medicare Other | Admitting: Vascular Surgery

## 2017-10-19 ENCOUNTER — Other Ambulatory Visit: Payer: Self-pay | Admitting: Cardiology

## 2017-11-21 ENCOUNTER — Other Ambulatory Visit: Payer: Self-pay | Admitting: Cardiology

## 2017-12-06 DIAGNOSIS — N342 Other urethritis: Secondary | ICD-10-CM | POA: Diagnosis not present

## 2017-12-06 DIAGNOSIS — Z1389 Encounter for screening for other disorder: Secondary | ICD-10-CM | POA: Diagnosis not present

## 2017-12-12 ENCOUNTER — Emergency Department (HOSPITAL_COMMUNITY)
Admission: EM | Admit: 2017-12-12 | Discharge: 2017-12-12 | Disposition: A | Discharge status: Home / Self Care | Payer: Medicare Other | Attending: Emergency Medicine | Admitting: Emergency Medicine

## 2017-12-12 ENCOUNTER — Emergency Department (HOSPITAL_COMMUNITY): Payer: Medicare Other

## 2017-12-12 ENCOUNTER — Encounter (HOSPITAL_COMMUNITY): Payer: Self-pay | Admitting: Emergency Medicine

## 2017-12-12 ENCOUNTER — Other Ambulatory Visit: Payer: Self-pay

## 2017-12-12 DIAGNOSIS — Z7982 Long term (current) use of aspirin: Secondary | ICD-10-CM | POA: Insufficient documentation

## 2017-12-12 DIAGNOSIS — Z79899 Other long term (current) drug therapy: Secondary | ICD-10-CM | POA: Insufficient documentation

## 2017-12-12 DIAGNOSIS — Z7902 Long term (current) use of antithrombotics/antiplatelets: Secondary | ICD-10-CM | POA: Diagnosis not present

## 2017-12-12 DIAGNOSIS — I2510 Atherosclerotic heart disease of native coronary artery without angina pectoris: Secondary | ICD-10-CM | POA: Diagnosis not present

## 2017-12-12 DIAGNOSIS — I5020 Unspecified systolic (congestive) heart failure: Secondary | ICD-10-CM | POA: Diagnosis not present

## 2017-12-12 DIAGNOSIS — J449 Chronic obstructive pulmonary disease, unspecified: Secondary | ICD-10-CM | POA: Diagnosis not present

## 2017-12-12 DIAGNOSIS — R002 Palpitations: Secondary | ICD-10-CM | POA: Diagnosis not present

## 2017-12-12 DIAGNOSIS — R079 Chest pain, unspecified: Secondary | ICD-10-CM | POA: Diagnosis not present

## 2017-12-12 DIAGNOSIS — R062 Wheezing: Secondary | ICD-10-CM | POA: Insufficient documentation

## 2017-12-12 DIAGNOSIS — R05 Cough: Secondary | ICD-10-CM | POA: Diagnosis not present

## 2017-12-12 DIAGNOSIS — F17210 Nicotine dependence, cigarettes, uncomplicated: Secondary | ICD-10-CM | POA: Insufficient documentation

## 2017-12-12 DIAGNOSIS — I110 Hypertensive heart disease with heart failure: Secondary | ICD-10-CM | POA: Diagnosis not present

## 2017-12-12 LAB — CBC WITH DIFFERENTIAL/PLATELET
BASOS ABS: 0 10*3/uL (ref 0.0–0.1)
Basophils Relative: 1 %
EOS PCT: 8 %
Eosinophils Absolute: 0.5 10*3/uL (ref 0.0–0.7)
HCT: 42.9 % (ref 39.0–52.0)
Hemoglobin: 14.1 g/dL (ref 13.0–17.0)
LYMPHS PCT: 39 %
Lymphs Abs: 2.4 10*3/uL (ref 0.7–4.0)
MCH: 30.7 pg (ref 26.0–34.0)
MCHC: 32.9 g/dL (ref 30.0–36.0)
MCV: 93.3 fL (ref 78.0–100.0)
MONO ABS: 0.5 10*3/uL (ref 0.1–1.0)
Monocytes Relative: 8 %
Neutro Abs: 2.8 10*3/uL (ref 1.7–7.7)
Neutrophils Relative %: 44 %
PLATELETS: 229 10*3/uL (ref 150–400)
RBC: 4.6 MIL/uL (ref 4.22–5.81)
RDW: 12.6 % (ref 11.5–15.5)
WBC: 6.2 10*3/uL (ref 4.0–10.5)

## 2017-12-12 LAB — URINALYSIS, ROUTINE W REFLEX MICROSCOPIC
BILIRUBIN URINE: NEGATIVE
GLUCOSE, UA: NEGATIVE mg/dL
HGB URINE DIPSTICK: NEGATIVE
KETONES UR: NEGATIVE mg/dL
Leukocytes, UA: NEGATIVE
Nitrite: NEGATIVE
PH: 7 (ref 5.0–8.0)
Protein, ur: NEGATIVE mg/dL
Specific Gravity, Urine: 1.005 (ref 1.005–1.030)

## 2017-12-12 LAB — BASIC METABOLIC PANEL
ANION GAP: 8 (ref 5–15)
BUN: 6 mg/dL (ref 6–20)
CALCIUM: 10.1 mg/dL (ref 8.9–10.3)
CO2: 28 mmol/L (ref 22–32)
CREATININE: 1.08 mg/dL (ref 0.61–1.24)
Chloride: 103 mmol/L (ref 101–111)
GFR calc Af Amer: >60 mL/min (ref >60)
Glucose, Bld: 111 mg/dL — ABNORMAL HIGH (ref 65–99)
Potassium: 4.3 mmol/L (ref 3.5–5.1)
Sodium: 139 mmol/L (ref 135–145)

## 2017-12-12 LAB — TROPONIN I: Troponin I: <0.03 ng/mL (ref <0.03)

## 2017-12-12 MED ORDER — PREDNISONE 20 MG PO TABS: 40.0000 mg | ORAL_TABLET | Freq: Every day | ORAL | 0 refills | Status: DC

## 2017-12-12 MED ORDER — ALBUTEROL SULFATE HFA 108 (90 BASE) MCG/ACT IN AERS: 2.0000 | INHALATION_SPRAY | RESPIRATORY_TRACT | 0 refills | Status: AC | PRN

## 2017-12-12 MED ORDER — ALBUTEROL SULFATE HFA 108 (90 BASE) MCG/ACT IN AERS
2.0000 | INHALATION_SPRAY | Freq: Once | RESPIRATORY_TRACT | Status: AC
  Administered 2017-12-12: 2 via RESPIRATORY_TRACT
  Filled 2017-12-12: qty 6.7

## 2017-12-12 NOTE — ED Notes (Signed)
Patient returned from X-ray 

## 2017-12-12 NOTE — ED Provider Notes (Signed)
[facility] EMERGENCY DEPARTMENT Provider Note   CSN: [id number] Arrival date & time: 12/12/17  8099     History   Chief Complaint Chief Complaint  Patient presents with  . Palpitations    HPI [name] is a 67 y.o. male.  HPI  This is a 67 year old male with a history of COPD, coronary artery disease who presents with "I just do not feel well."  Patient reports recent diagnosis of UTI.  He has been on ciprofloxacin.  Over the last several days he has noted "chest congestion" and frequent palpitations.  He denies any chest pain or shortness of breath.  He does have a nonproductive cough.  No fevers.  Denies any urinary symptoms, abdominal pain, flank pain, or nausea or vomiting.  Patient is not on any maintenance COPD medications and has not used an inhaler.  Past Medical History:  Diagnosis Date  . Abdominal aortic aneurysm ([place])   . Adrenal adenoma   . COPD (chronic obstructive pulmonary disease) ([place])   . Coronary artery disease    Multivessel, LVEF 50%, occluded SVG to RCA, DES SVG to OM system 2007  . Essential hypertension   . Hyperlipidemia   . Noncompliance   . Syncope    Acute gastroenteritis; vasovagal  . Systolic dysfunction    EF 45-50%  . Vitamin B12 deficiency anemia   . Wolff-Parkinson-White (WPW) syndrome    Failed ablation    Patient Active Problem List   Diagnosis Date Noted  . AAA (abdominal aortic aneurysm) ([place]) 01/11/2017  . Tachyarrhythmia 07/31/2016  . Atrial fibrillation with RVR ([place]) 07/31/2016  . History of Wolff-Parkinson-White (WPW) syndrome 07/31/2016  . Chest tightness 07/31/2016  . Smoker 07/31/2016  . Vitamin B12 deficiency anemia 07/15/2012  . Sigmoid diverticulitis 07/13/2012  . Perforation of sigmoid colon ([place]) 07/13/2012  . Abdominal aortic aneurysm ([place]) 07/13/2012  . Adrenal adenoma 07/13/2012  . Vasovagal syncope 06/28/2012  . Hematochezia 12/03/2010  . Essential hypertension, benign 02/19/2010  . History CABG x  7 1997 02/19/2010  . HYPERLIPIDEMIA 09/10/2009  . TOBACCO ABUSE 09/10/2009  . WOLFF (WOLFE)-PARKINSON-WHITE (WPW) SYNDROME 09/10/2009  . GERD 09/10/2009    Past Surgical History:  Procedure Laterality Date  . ABDOMINAL AORTIC ENDOVASCULAR STENT GRAFT N/A 01/11/2017   Procedure: ABDOMINAL AORTIC ENDOVASCULAR STENT GRAFT;  Surgeon: Elam Dutch, MD;  Location: [place];  Service: Vascular;  Laterality: N/A;  . BLADDER SURGERY    . CORONARY ANGIOPLASTY WITH STENT PLACEMENT    . CORONARY ARTERY BYPASS GRAFT  1995   LIMA to LAD , SVG to diagonal, SVG to OM1 and OM2 , SVG to RCA         Home Medications    Prior to Admission medications   Medication Sig Start Date End Date Taking? Authorizing Provider  albuterol (PROVENTIL HFA;VENTOLIN HFA) 108 (90 Base) MCG/ACT inhaler Inhale 2 puffs into the lungs every 4 (four) hours as needed for wheezing or shortness of breath. 12/12/17   Lynne Takemoto, Barbette Hair, MD  ALPRAZolam Duanne Moron) 0.5 MG tablet Take 0.25 mg by mouth at bedtime as needed for sleep.    [provider]  aspirin 325 MG tablet Take 325 mg by mouth at bedtime.     [provider]  atorvastatin (LIPITOR) 80 MG tablet TAKE 1 TABLET BY MOUTH ONCE DAILY FOR CHOLESTEROL. 06/22/17   Satira Sark, MD  clopidogrel (PLAVIX) 75 MG tablet TAKE (1) TABLET BY MOUTH ONCE DAILY. 06/22/17   Satira Sark, MD  fenofibrate (TRICOR) 145 MG tablet TAKE 1 TABLET BY MOUTH ONCE DAILY FOR CHOLESTEROL. 06/22/17   Satira Sark, MD  Magnesium 400 MG TABS Take by mouth.    [provider]  metoprolol tartrate (LOPRESSOR) 25 MG tablet TAKE 1 & 1/2 TABLETS BY MOUTH TWICE DAILY. 11/21/17   Satira Sark, MD  nitroGLYCERIN (NITROSTAT) 0.4 MG SL tablet Place 1 tablet (0.4 mg total) under the tongue every 5 (five) minutes as needed. 06/13/17   Satira Sark, MD  pantoprazole (PROTONIX) 40 MG tablet TAKE 1 TABLET BY MOUTH ONCE DAILY FOR ACID REFLUX. 10/19/17   Satira Sark, MD  polyethylene glycol [facility] Floria Raveling) packet Take 17 g by mouth daily as needed for moderate constipation.    [provider]  predniSONE (DELTASONE) 20 MG tablet Take 2 tablets (40 mg total) by mouth daily. 12/12/17   Apryle Stowell, Barbette Hair, MD  ramipril (ALTACE) 2.5 MG capsule TAKE (1) CAPSULE BY MOUTH ONCE DAILY. 07/24/17   Satira Sark, MD    Family History Family History  Problem Relation Age of Onset  . Varicose Veins Mother   . Heart disease Father   . Heart attack Father   . Bleeding Disorder Sister   . Peripheral vascular disease Sister        amputation  . Diabetes Brother   . Varicose Veins Brother   . Diabetes Brother   . Heart disease Brother   . Varicose Veins Brother     Social History Social History   Tobacco Use  . Smoking status: Current Every Day Smoker    Packs/day: 1.00    Years: 40.00    Pack years: 40.00    Types: Cigarettes    Start date: 06/03/1969  . Smokeless tobacco: Never Used  . Tobacco comment: 1 pk a day or less.   Substance Use Topics  . Alcohol use: No    Alcohol/week: 0.0 oz  . Drug use: No     Allergies   Codeine   Review of Systems Review of Systems  Constitutional: Negative for fever.  Respiratory: Positive for cough. Negative for chest tightness and shortness of breath.   Cardiovascular: Positive for palpitations. Negative for leg swelling.  Gastrointestinal: Negative for abdominal pain, nausea and vomiting.  Genitourinary: Negative for dysuria.  All other systems reviewed and are negative.    Physical Exam Updated Vital Signs BP (!) 155/97   Pulse 84   Temp 98.8 F (37.1 C)   Resp 18   Ht 5\' 7"  (1.702 m)   Wt 77.1 kg (170 lb)   SpO2 98%   BMI 26.63 kg/m   Physical Exam  Constitutional: He is oriented to person, place, and time. He appears well-developed and well-nourished. No distress.  HENT:  Head: Normocephalic and atraumatic.  Cardiovascular: Normal rate, regular rhythm and normal  heart sounds.  No murmur heard. Midline sternotomy, scar well-healed  Pulmonary/Chest: Effort normal and breath sounds normal. No respiratory distress. He has no wheezes.  Expiratory wheezing noted right lung fields greater than left, fair air movement, no acute distress  Abdominal: Soft. Bowel sounds are normal. There is no tenderness. There is no rebound.  Musculoskeletal: He exhibits no edema.  Neurological: He is alert and oriented to person, place, and time.  Skin: Skin is warm and dry.  Psychiatric: He has a normal mood and affect.  Nursing note and vitals reviewed.    ED Treatments / Results  Labs (all labs ordered are  listed, but only abnormal results are displayed) Labs Reviewed  BASIC METABOLIC PANEL - Abnormal; Notable for the following components:      Result Value   Glucose, Bld 111 (*)    All other components within normal limits  URINALYSIS, ROUTINE W REFLEX MICROSCOPIC - Abnormal; Notable for the following components:   Color, Urine STRAW (*)    All other components within normal limits  CBC WITH DIFFERENTIAL/PLATELET  TROPONIN I    EKG EKG Interpretation  Date/Time:  Tuesday December 12 2017 00:39:19 EDT Ventricular Rate:  82 PR Interval:  158 QRS Duration: 86 QT Interval:  336 QTC Calculation: 392 R Axis:   -48 Text Interpretation:  Normal sinus rhythm Left axis deviation Inferior infarct , age undetermined Abnormal ECG Confirmed by Thayer Jew [phone]) on 12/12/2017 2:36:37 AM   Radiology Dg Chest 2 View  Result Date: 12/12/2017 CLINICAL DATA:  Diagnosed with urinary tract infection last week. Bilateral chest pain, cough and dyspnea. EXAM: CHEST - 2 VIEW COMPARISON:  01/11/2017 CXR, chest CT 12 10 17  FINDINGS: The patient is status post CABG. Heart size is stable and within normal limits. There is moderate aortic atherosclerosis at the arch without aneurysm. Lungs are hyperinflated without acute pulmonary consolidation, effusion or pneumothorax. Bones  are slightly demineralized in appearance. Chronic stable mild midthoracic compression deformity. IMPRESSION: Lungs appear hyperinflated since prior exam. No acute pulmonary consolidation or CHF however. Status post CABG. Electronically Signed   By: Ashley Royalty M.D.   On: 12/12/2017 02:28    Procedures Procedures (including critical care time)  Medications Ordered in ED Medications  albuterol (PROVENTIL HFA;VENTOLIN HFA) 108 (90 Base) MCG/ACT inhaler 2 puff (2 puffs Inhalation Given 12/12/17 0143)     Initial Impression / Assessment and Plan / ED Course  I have reviewed the triage vital signs and the nursing notes.  Pertinent labs & imaging results that were available during my care of the patient were reviewed by me and considered in my medical decision making (see chart for details).     She reports that he generally has not felt well.  He is overall nontoxic.  Vital signs are notable for blood pressure 155/97.  EKG shows no evidence of acute arrhythmia.  Patient does have a history of WPW.  There is no evidence of ischemia.  Chest x-ray shows no evidence of pneumothorax or pneumonia.  Given wheezing and history of smoking, patient was given an inhaler.  O2 sats are 98% and he is in no acute distress.  On recheck, he states he feels much better after the inhaler.  Will treat for presumed bronchitis/COPD.  Regarding palpitations, there are no significant metabolic derangements and EKG is reassuring.  Recommend close follow-up with primary physician.  After history, exam, and medical workup I feel the patient has been appropriately medically screened and is safe for discharge home. Pertinent diagnoses were discussed with the patient. Patient was given return precautions.   Final Clinical Impressions(s) / ED Diagnoses   Final diagnoses:  Palpitations  Wheezing    ED Discharge Orders        Ordered    albuterol (PROVENTIL HFA;VENTOLIN HFA) 108 (90 Base) MCG/ACT inhaler  Every 4 hours PRN      12/12/17 0351    predniSONE (DELTASONE) 20 MG tablet  Daily     12/12/17 0351       Merryl Hacker, MD 12/12/17 0403

## 2017-12-12 NOTE — ED Notes (Signed)
RT notified for albuterol MDI

## 2017-12-12 NOTE — ED Triage Notes (Signed)
Pt recently diagnosed with uti last week and placed on cipro. Pt states 4-5 feels like heart is quivering or beating hard. Pt also c/o feeling flush with cough and congestion.

## 2017-12-22 ENCOUNTER — Other Ambulatory Visit: Payer: Self-pay | Admitting: Cardiology

## 2018-02-14 DIAGNOSIS — Z0001 Encounter for general adult medical examination with abnormal findings: Secondary | ICD-10-CM | POA: Diagnosis not present

## 2018-02-14 DIAGNOSIS — I2510 Atherosclerotic heart disease of native coronary artery without angina pectoris: Secondary | ICD-10-CM | POA: Diagnosis not present

## 2018-02-14 DIAGNOSIS — J449 Chronic obstructive pulmonary disease, unspecified: Secondary | ICD-10-CM | POA: Diagnosis not present

## 2018-02-14 DIAGNOSIS — R7309 Other abnormal glucose: Secondary | ICD-10-CM | POA: Diagnosis not present

## 2018-02-14 DIAGNOSIS — Z1389 Encounter for screening for other disorder: Secondary | ICD-10-CM | POA: Diagnosis not present

## 2018-02-14 DIAGNOSIS — I10 Essential (primary) hypertension: Secondary | ICD-10-CM | POA: Diagnosis not present

## 2018-02-19 NOTE — Progress Notes (Signed)
Cardiology Office Note  Date: 02/20/2018   ID: [name], DOB August 08, 1951, MRN [id number]  PCP: [name] Sites, MD  Primary Cardiologist: [name] Lesches, MD   Chief Complaint  Patient presents with  . Coronary Artery Disease    History of Present Illness: [name] is a 67 y.o. male last seen in October 2018.  He is here for a routine visit.  Reports fatigue, but no angina symptoms or increasing shortness of breath with typical activities.  States that he had a recent physical with his PCP and had lab work which we are requesting for review.  Ischemic testing from April of last year is outlined below.  We went over the results today.  I reviewed his current medications.  We discussed reducing aspirin to 81 mg daily.  He otherwise continues on Plavix, Lipitor, TriCor, Lopressor, Altace, and has as needed nitroglycerin available.  Past Medical History:  Diagnosis Date  . Abdominal aortic aneurysm ([facility])   . Adrenal adenoma   . COPD (chronic obstructive pulmonary disease) ([facility])   . Coronary artery disease    Multivessel, LVEF 50%, occluded SVG to RCA, DES SVG to OM system 2007  . Essential hypertension   . Hyperlipidemia   . Noncompliance   . Syncope    Acute gastroenteritis; vasovagal  . Systolic dysfunction    EF 45-50%  . Vitamin B12 deficiency anemia   . Wolff-Parkinson-White (WPW) syndrome    Failed ablation    Past Surgical History:  Procedure Laterality Date  . ABDOMINAL AORTIC ENDOVASCULAR STENT GRAFT N/A 01/11/2017   Procedure: ABDOMINAL AORTIC ENDOVASCULAR STENT GRAFT;  Surgeon: Elam Dutch, MD;  Location: [facility];  Service: Vascular;  Laterality: N/A;  . BLADDER SURGERY    . CORONARY ANGIOPLASTY WITH STENT PLACEMENT    . CORONARY ARTERY BYPASS GRAFT  1995   LIMA to LAD , SVG to diagonal, SVG to OM1 and OM2 , SVG to RCA     Current Outpatient Medications  Medication Sig Dispense Refill  . albuterol (PROVENTIL HFA;VENTOLIN HFA) 108 (90 Base)  MCG/ACT inhaler Inhale 2 puffs into the lungs every 4 (four) hours as needed for wheezing or shortness of breath. 1 Inhaler 0  . ALPRAZolam (XANAX) 0.5 MG tablet Take 0.25 mg by mouth at bedtime as needed for sleep.    Marland Kitchen aspirin EC 81 MG tablet Take 81 mg by mouth daily.    Marland Kitchen atorvastatin (LIPITOR) 80 MG tablet TAKE 1 TABLET BY MOUTH ONCE DAILY FOR CHOLESTEROL. 30 tablet 2  . clopidogrel (PLAVIX) 75 MG tablet TAKE (1) TABLET BY MOUTH ONCE DAILY. 90 tablet 3  . fenofibrate (TRICOR) 145 MG tablet TAKE 1 TABLET BY MOUTH ONCE DAILY FOR CHOLESTEROL. 30 tablet 2  . Magnesium 400 MG TABS Take by mouth.    . metoprolol tartrate (LOPRESSOR) 25 MG tablet TAKE 1 & 1/2 TABLETS BY MOUTH TWICE DAILY. 90 tablet 6  . nitroGLYCERIN (NITROSTAT) 0.4 MG SL tablet Place 1 tablet (0.4 mg total) under the tongue every 5 (five) minutes as needed. 25 tablet 3  . pantoprazole (PROTONIX) 40 MG tablet TAKE 1 TABLET BY MOUTH ONCE DAILY FOR ACID REFLUX. 30 tablet 6  . polyethylene glycol (MIRALAX / GLYCOLAX) packet Take 17 g by mouth daily as needed for moderate constipation.    . predniSONE (DELTASONE) 20 MG tablet Take 2 tablets (40 mg total) by mouth daily. 10 tablet 0  . ramipril (ALTACE) 2.5 MG capsule TAKE (1) CAPSULE BY MOUTH  ONCE DAILY. 30 capsule 2   No current facility-administered medications for this visit.    Allergies:  Codeine   Social History: The patient  reports that he has been smoking cigarettes.  He started smoking about 48 years ago. He has a 40.00 pack-year smoking history. He has never used smokeless tobacco. He reports that he does not drink alcohol or use drugs.   ROS:  Please see the history of present illness. Otherwise, complete review of systems is positive for none.  All other systems are reviewed and negative.   Physical Exam: VS:  BP 122/72 (BP Location: Left Arm)   Pulse 60   Ht 5\' 8"  (1.727 m)   Wt 170 lb (77.1 kg)   SpO2 97%   BMI 25.85 kg/m , BMI Body mass index is 25.85  kg/m.  Wt Readings from Last 3 Encounters:  02/20/18 170 lb (77.1 kg)  12/12/17 170 lb (77.1 kg)  06/13/17 165 lb (74.8 kg)    General: Patient appears comfortable at rest. HEENT: Conjunctiva and lids normal, oropharynx clear. Neck: Supple, no elevated JVP or carotid bruits, no thyromegaly. Lungs: Clear to auscultation, nonlabored breathing at rest. Cardiac: Regular rate and rhythm, no S3, 3/6 apical systolic murmur, no pericardial rub. Abdomen: Soft, nontender, bowel sounds present. Extremities: No pitting edema, distal pulses 2+. Skin: Warm and dry. Musculoskeletal: No kyphosis. Neuropsychiatric: Alert and oriented x3, affect grossly appropriate.  ECG: I personally reviewed the tracing from 12/12/2017 which showed sinus rhythm with leftward axis, rule out old inferior infarct pattern.  Recent Labwork: 12/12/2017: BUN 6; Creatinine, Ser 1.08; Hemoglobin 14.1; Platelets 229; Potassium 4.3; Sodium 139     Component Value Date/Time   CHOL 111 07/14/2012 0615   TRIG 99 07/14/2012 0615   HDL 26 (L) 07/14/2012 0615   CHOLHDL 4.3 07/14/2012 0615   VLDL 20 07/14/2012 0615   LDLCALC 65 07/14/2012 0615    Other Studies Reviewed Today:  Chest x-ray 12/12/2017: FINDINGS: The patient is status post CABG. Heart size is stable and within normal limits. There is moderate aortic atherosclerosis at the arch without aneurysm. Lungs are hyperinflated without acute pulmonary consolidation, effusion or pneumothorax. Bones are slightly demineralized in appearance. Chronic stable mild midthoracic compression deformity.  IMPRESSION: Lungs appear hyperinflated since prior exam. No acute pulmonary consolidation or CHF however. Status post CABG.  Lexiscan Myoview 11/29/2016:  There was no ST segment deviation noted during stress.  T wave inversion was noted during stress in the II, III, aVF and V6 leads.  Defect 1: There is a large defect of severe severity present in the basal inferoseptal,  basal inferior, basal inferolateral, mid inferoseptal, mid inferior and mid inferolateral location.  Findings consistent with prior myocardial infarction with a mild degree of peri-infarct ischemia in the inferoseptal wall.  This is an intermediate risk study.  Nuclear stress EF: 55%.  Assessment and Plan:  1.  Multivessel CAD status post CABG with documented graft disease and subsequent DES to the SVG to OM greater than 10 years ago.  Follow-up ischemic testing from April of last year demonstrated large inferolateral infarct scar with mild peri-infarct ischemia, overall preserved LVEF.  Plan is to continue medical therapy in the absence of progressive angina.  He will reduce aspirin to 81 mg daily.  2.  Heart murmur in mitral position, follow-up echocardiogram will be obtained.  3.  Hyperlipidemia, continues on Lipitor.  We are requesting his recent lab work from PCP.  4.  Essential hypertension, blood pressure  is well controlled today.  Current medicines were reviewed with the patient today.   Orders Placed This Encounter  Procedures  . ECHOCARDIOGRAM COMPLETE    Disposition: Follow-up in 6 months.  Signed, Satira Sark, MD, [facility] 02/20/2018 11:58 AM    [facility]. [address], [place], [place] [address] Phone: [phone]; Fax: [phone]

## 2018-02-20 ENCOUNTER — Encounter: Payer: Self-pay | Admitting: Cardiology

## 2018-02-20 ENCOUNTER — Ambulatory Visit: Payer: Medicare Other | Admitting: Cardiology

## 2018-02-20 VITALS — BP 122/72 | HR 60 | Ht 68.0 in | Wt 170.0 lb

## 2018-02-20 DIAGNOSIS — I25119 Atherosclerotic heart disease of native coronary artery with unspecified angina pectoris: Secondary | ICD-10-CM

## 2018-02-20 DIAGNOSIS — E782 Mixed hyperlipidemia: Secondary | ICD-10-CM

## 2018-02-20 DIAGNOSIS — R011 Cardiac murmur, unspecified: Secondary | ICD-10-CM | POA: Diagnosis not present

## 2018-02-20 DIAGNOSIS — I10 Essential (primary) hypertension: Secondary | ICD-10-CM | POA: Diagnosis not present

## 2018-02-20 NOTE — Patient Instructions (Addendum)
Your physician wants you to follow-up in: 6 months   with Dr.McDowell You will receive a reminder letter in the mail two months in advance. If you don't receive a letter, please call our office to schedule the follow-up appointment.     DECREASE Aspirin to 81 mg daily   All other medications stay the same    Your physician has requested that you have an echocardiogram. Echocardiography is a painless test that uses sound waves to create images of your heart. It provides your doctor with information about the size and shape of your heart and how well your heart's chambers and valves are working. This procedure takes approximately one hour. There are no restrictions for this procedure.     No lab work or tests ordered today.      Thank you for choosing [facility] !

## 2018-02-26 ENCOUNTER — Ambulatory Visit (HOSPITAL_COMMUNITY)
Admission: RE | Admit: 2018-02-26 | Discharge: 2018-02-26 | Disposition: A | Discharge status: Home / Self Care | Payer: Medicare Other | Source: Ambulatory Visit | Attending: Cardiology | Admitting: Cardiology

## 2018-02-26 DIAGNOSIS — I10 Essential (primary) hypertension: Secondary | ICD-10-CM | POA: Insufficient documentation

## 2018-02-26 DIAGNOSIS — I340 Nonrheumatic mitral (valve) insufficiency: Secondary | ICD-10-CM | POA: Diagnosis not present

## 2018-02-26 DIAGNOSIS — R011 Cardiac murmur, unspecified: Secondary | ICD-10-CM | POA: Diagnosis not present

## 2018-02-26 DIAGNOSIS — Z951 Presence of aortocoronary bypass graft: Secondary | ICD-10-CM | POA: Diagnosis not present

## 2018-02-26 DIAGNOSIS — G20 Parkinson's disease: Secondary | ICD-10-CM | POA: Insufficient documentation

## 2018-02-26 DIAGNOSIS — I456 Pre-excitation syndrome: Secondary | ICD-10-CM | POA: Insufficient documentation

## 2018-02-26 DIAGNOSIS — Z720 Tobacco use: Secondary | ICD-10-CM | POA: Diagnosis not present

## 2018-02-26 DIAGNOSIS — E785 Hyperlipidemia, unspecified: Secondary | ICD-10-CM | POA: Insufficient documentation

## 2018-02-26 DIAGNOSIS — I4891 Unspecified atrial fibrillation: Secondary | ICD-10-CM | POA: Diagnosis not present

## 2018-02-26 NOTE — Progress Notes (Signed)
*  PRELIMINARY RESULTS* Echocardiogram 2D Echocardiogram has been performed.  Samuel Germany 02/26/2018, 3:37 PM

## 2018-03-22 ENCOUNTER — Other Ambulatory Visit: Payer: Self-pay | Admitting: Cardiology

## 2018-03-23 ENCOUNTER — Other Ambulatory Visit: Payer: Self-pay | Admitting: Cardiology

## 2018-03-23 NOTE — Telephone Encounter (Signed)
Needing refills for his  ramipril (ALTACE) 2.5 MG capsule [[id number]]   And fenofibrate (TRICOR) 145 MG tablet [[id number]]   Please send to [facility]   90 day supply

## 2018-03-26 NOTE — Telephone Encounter (Signed)
Already refilled per fax from pharmacy

## 2018-06-19 ENCOUNTER — Other Ambulatory Visit: Payer: Self-pay | Admitting: Cardiology

## 2018-09-17 ENCOUNTER — Other Ambulatory Visit: Payer: Self-pay | Admitting: Cardiology

## 2019-03-13 ENCOUNTER — Other Ambulatory Visit: Payer: Self-pay | Admitting: Cardiology

## 2019-05-01 ENCOUNTER — Ambulatory Visit: Payer: Medicare Other | Admitting: Cardiology

## 2019-06-11 ENCOUNTER — Encounter: Payer: Self-pay | Admitting: Cardiology

## 2019-06-11 ENCOUNTER — Ambulatory Visit (INDEPENDENT_AMBULATORY_CARE_PROVIDER_SITE_OTHER): Payer: Medicare Other | Admitting: Cardiology

## 2019-06-11 ENCOUNTER — Other Ambulatory Visit: Payer: Self-pay

## 2019-06-11 VITALS — BP 150/84 | HR 76 | Temp 98.0°F | Ht 68.0 in | Wt 164.0 lb

## 2019-06-11 DIAGNOSIS — I25119 Atherosclerotic heart disease of native coronary artery with unspecified angina pectoris: Secondary | ICD-10-CM

## 2019-06-11 DIAGNOSIS — Z720 Tobacco use: Secondary | ICD-10-CM

## 2019-06-11 DIAGNOSIS — I10 Essential (primary) hypertension: Secondary | ICD-10-CM | POA: Diagnosis not present

## 2019-06-11 DIAGNOSIS — E782 Mixed hyperlipidemia: Secondary | ICD-10-CM | POA: Diagnosis not present

## 2019-06-11 DIAGNOSIS — Z23 Encounter for immunization: Secondary | ICD-10-CM | POA: Diagnosis not present

## 2019-06-11 MED ORDER — METOPROLOL TARTRATE 25 MG PO TABS: ORAL_TABLET | ORAL | 2 refills | Status: DC

## 2019-06-11 MED ORDER — ATORVASTATIN CALCIUM 80 MG PO TABS: ORAL_TABLET | ORAL | 3 refills | Status: DC

## 2019-06-11 MED ORDER — CLOPIDOGREL BISULFATE 75 MG PO TABS: ORAL_TABLET | ORAL | 3 refills | Status: DC

## 2019-06-11 MED ORDER — FENOFIBRATE 145 MG PO TABS: ORAL_TABLET | ORAL | 3 refills | Status: DC

## 2019-06-11 MED ORDER — RAMIPRIL 2.5 MG PO CAPS: ORAL_CAPSULE | ORAL | 3 refills | Status: DC

## 2019-06-11 NOTE — Addendum Note (Signed)
Addended by: Barbarann Ehlers A on: 06/11/2019 02:17 PM   Modules accepted: Orders

## 2019-06-11 NOTE — Progress Notes (Addendum)
Cardiology Office Note  Date: 06/11/2019   ID: [name], DOB August 10, 1951, MRN [id number]  PCP:  Sharilyn Sites, MD  Cardiologist:  Rozann Lesches, MD Electrophysiologist:  None   Chief Complaint  Patient presents with   Cardiac follow-up    History of Present Illness: [name] is a 68 y.o. male last seen in July 2019.  He presents for a routine visit.  He does not report any active angina symptoms at this time, states that he has been taking his medications regularly.  Echocardiogram from July of last year revealed LVEF approximately 50% with wall motion abnormalities consistent with ischemic heart disease, mildly reduced RV contraction, and mild mitral regurgitation.  We went over his medical regimen which is listed below.  He has preferred to take full dose aspirin as it seems to help his arthritis pain.  I personally reviewed his ECG today which shows sinus rhythm with nonspecific T wave changes.  He requested a flu shot today.  He is also due for follow-up lab work with PCP.  Past Medical History:  Diagnosis Date   Abdominal aortic aneurysm ([facility])    Adrenal adenoma    COPD (chronic obstructive pulmonary disease) ([facility])    Coronary artery disease    Multivessel, LVEF 50%, occluded SVG to RCA, DES SVG to OM system 2007   Essential hypertension    Hyperlipidemia    Noncompliance    Syncope    Acute gastroenteritis; vasovagal   Systolic dysfunction    EF 45-50%   Vitamin B12 deficiency anemia    Wolff-Parkinson-White (WPW) syndrome    Failed ablation    Past Surgical History:  Procedure Laterality Date   ABDOMINAL AORTIC ENDOVASCULAR STENT GRAFT N/A 01/11/2017   Procedure: ABDOMINAL AORTIC ENDOVASCULAR STENT GRAFT;  Surgeon: Elam Dutch, MD;  Location: [facility];  Service: Vascular;  Laterality: N/A;   BLADDER SURGERY     CORONARY ANGIOPLASTY WITH STENT PLACEMENT     CORONARY ARTERY BYPASS GRAFT  1995   LIMA to LAD , SVG to  diagonal, SVG to OM1 and OM2 , SVG to RCA     Current Outpatient Medications  Medication Sig Dispense Refill   albuterol (PROVENTIL HFA;VENTOLIN HFA) 108 (90 Base) MCG/ACT inhaler Inhale 2 puffs into the lungs every 4 (four) hours as needed for wheezing or shortness of breath. 1 Inhaler 0   ALPRAZolam (XANAX) 0.5 MG tablet Take 0.25 mg by mouth at bedtime as needed for sleep.     aspirin EC 81 MG tablet Take 81 mg by mouth daily.     atorvastatin (LIPITOR) 80 MG tablet TAKE 1 TABLET BY MOUTH ONCE DAILY FOR CHOLESTEROL. 90 tablet 3   clopidogrel (PLAVIX) 75 MG tablet TAKE (1) TABLET BY MOUTH ONCE DAILY. 90 tablet 3   fenofibrate (TRICOR) 145 MG tablet TAKE 1 TABLET BY MOUTH ONCE DAILY FOR CHOLESTEROL. 90 tablet 0   Magnesium 400 MG TABS Take by mouth.     metoprolol tartrate (LOPRESSOR) 25 MG tablet TAKE 1 & 1/2 TABLETS BY MOUTH TWICE DAILY. 270 tablet 2   nitroGLYCERIN (NITROSTAT) 0.4 MG SL tablet Place 1 tablet (0.4 mg total) under the tongue every 5 (five) minutes as needed. 25 tablet 3   pantoprazole (PROTONIX) 40 MG tablet TAKE 1 TABLET BY MOUTH ONCE DAILY FOR ACID REFLUX. 90 tablet 0   polyethylene glycol (MIRALAX / GLYCOLAX) packet Take 17 g by mouth daily as needed for moderate constipation.     predniSONE (  DELTASONE) 20 MG tablet Take 2 tablets (40 mg total) by mouth daily. 10 tablet 0   ramipril (ALTACE) 2.5 MG capsule TAKE (1) CAPSULE BY MOUTH ONCE DAILY. 90 capsule 0   No current facility-administered medications for this visit.    Allergies:  Codeine   Social History: The patient  reports that he has been smoking cigarettes. He started smoking about 50 years ago. He has a 40.00 pack-year smoking history. He has never used smokeless tobacco. He reports that he does not drink alcohol or use drugs.   ROS:  Please see the history of present illness. Otherwise, complete review of systems is positive for none.  All other systems are reviewed and negative.   Physical  Exam: VS:  BP (!) 150/84    Pulse 76    Temp 98 F (36.7 C)    Ht 5\' 8"  (1.727 m)    Wt 164 lb (74.4 kg)    SpO2 97%    BMI 24.94 kg/m , BMI Body mass index is 24.94 kg/m.  Wt Readings from Last 3 Encounters:  06/11/19 164 lb (74.4 kg)  02/20/18 170 lb (77.1 kg)  12/12/17 170 lb (77.1 kg)    General: Patient appears comfortable at rest. HEENT: Conjunctiva and lids normal, wearing a mask. Neck: Supple, no elevated JVP or carotid bruits, no thyromegaly. Lungs: Clear to auscultation, nonlabored breathing at rest. Cardiac: Regular rate and rhythm, no S3, 2/6 systolic murmur, no pericardial rub. Abdomen: Soft, nontender, bowel sounds present. Extremities: No pitting edema, distal pulses 2+. Skin: Warm and dry. Musculoskeletal: No kyphosis. Neuropsychiatric: Alert and oriented x3, affect grossly appropriate.  ECG:  An ECG dated 12/12/2017 was personally reviewed today and demonstrated:  Sinus rhythm with leftward axis, rule out old inferior infarct pattern.  Recent Labwork:  June 2019: Hemoglobin 14.7, platelets 207, BUN 5, creatinine 1.1 calcium 4.5, AST 16, ALT 15, cholesterol 126, triglycerides 98, HDL 29, LDL 77, TSH 1.37, hemoglobin A1c 5.7%  Other Studies Reviewed Today:  Lexiscan Myoview 11/29/2016:  There was no ST segment deviation noted during stress.  T wave inversion was noted during stress in the II, III, aVF and V6 leads.  Defect 1: There is a large defect of severe severity present in the basal inferoseptal, basal inferior, basal inferolateral, mid inferoseptal, mid inferior and mid inferolateral location.  Findings consistent with prior myocardial infarction with a mild degree of peri-infarct ischemia in the inferoseptal wall.  This is an intermediate risk study.  Nuclear stress EF: 55%.  Echocardiogram 02/26/2018: Study Conclusions  - Left ventricle: The cavity size was normal. Wall thickness was   normal. Systolic function was at the lower limits of normal.  The   estimated ejection fraction was 50%. Doppler parameters are   consistent with abnormal left ventricular relaxation (grade 1   diastolic dysfunction). Doppler parameters are consistent with   indeterminate ventricular filling pressure. - Regional wall motion abnormality: Hypokinesis of the basal-mid   inferior and basal-mid inferolateral myocardium. - Aortic valve: Moderately calcified annulus. Trileaflet. - Mitral valve: There was mild regurgitation. - Left atrium: The atrium was mildly dilated. - Right ventricle: Systolic function was mildly reduced.  Assessment and Plan:  1.  Multivessel CAD status post CABG with graft disease and subsequent DES to the SVG to OM in the past.  He does not report any progressive angina and underwent follow-up ischemic testing in 2018.  ECG reviewed and stable.  We will continue with current regimen and observation.  2.  Mixed hyperlipidemia, he remains on Lipitor.  He is to follow-up with PCP for lab work.  3.  Essential hypertension, continue with present medications and follow-up with PCP.  Blood pressure is elevated today.  4.  Longstanding tobacco abuse.  We have discussed smoking cessation over time, he has not been able to quit.  Medication Adjustments/Labs and Tests Ordered: Current medicines are reviewed at length with the patient today.  Concerns regarding medicines are outlined above.   Tests Ordered: Orders Placed This Encounter  Procedures   EKG 12-Lead    Medication Changes: No orders of the defined types were placed in this encounter.   Disposition:  Follow up 1 year in the [place] office.  Signed, Satira Sark, MD, [facility] 06/11/2019 2:04 PM    [facility] at [facility] [address] [place], [place] [address] Phone: [phone]; Fax: [phone]

## 2019-06-11 NOTE — Patient Instructions (Signed)
Medication Instructions:  Your physician recommends that you continue on your current medications as directed. Please refer to the Current Medication list given to you today.  *If you need a refill on your cardiac medications before your next appointment, please call your pharmacy*  Lab Work: None today If you have labs (blood work) drawn today and your tests are completely normal, you will receive your results only by: Marland Kitchen MyChart Message (if you have MyChart) OR . A paper copy in the mail If you have any lab test that is abnormal or we need to change your treatment, we will call you to review the results.  Testing/Procedures: None today  Follow-Up: At [facility], you and your health needs are our priority.  As part of our continuing mission to provide you with exceptional heart care, we have created designated Provider Care Teams.  These Care Teams include your primary Cardiologist (physician) and Advanced Practice Providers (APPs -  Physician Assistants and Nurse Practitioners) who all work together to provide you with the care you need, when you need it.  Your next appointment:   12 months  The format for your next appointment:   In Person  Provider:   Rozann Lesches, MD  Other Instructions None

## 2019-06-12 ENCOUNTER — Other Ambulatory Visit: Payer: Self-pay | Admitting: Cardiology

## 2019-09-10 ENCOUNTER — Other Ambulatory Visit: Payer: Self-pay | Admitting: Cardiology

## 2019-12-09 ENCOUNTER — Other Ambulatory Visit: Payer: Self-pay | Admitting: Cardiology

## 2020-03-05 ENCOUNTER — Other Ambulatory Visit: Payer: Self-pay | Admitting: Cardiology

## 2020-03-19 ENCOUNTER — Other Ambulatory Visit: Payer: Self-pay | Admitting: Family Medicine

## 2020-03-19 DIAGNOSIS — Z1389 Encounter for screening for other disorder: Secondary | ICD-10-CM | POA: Diagnosis not present

## 2020-03-19 DIAGNOSIS — Z6823 Body mass index (BMI) 23.0-23.9, adult: Secondary | ICD-10-CM | POA: Diagnosis not present

## 2020-03-19 DIAGNOSIS — J449 Chronic obstructive pulmonary disease, unspecified: Secondary | ICD-10-CM | POA: Diagnosis not present

## 2020-03-19 DIAGNOSIS — R7309 Other abnormal glucose: Secondary | ICD-10-CM | POA: Diagnosis not present

## 2020-03-19 DIAGNOSIS — Z122 Encounter for screening for malignant neoplasm of respiratory organs: Secondary | ICD-10-CM

## 2020-03-19 DIAGNOSIS — Z0001 Encounter for general adult medical examination with abnormal findings: Secondary | ICD-10-CM | POA: Diagnosis not present

## 2020-03-19 DIAGNOSIS — I2510 Atherosclerotic heart disease of native coronary artery without angina pectoris: Secondary | ICD-10-CM | POA: Diagnosis not present

## 2020-03-19 DIAGNOSIS — I249 Acute ischemic heart disease, unspecified: Secondary | ICD-10-CM | POA: Diagnosis not present

## 2020-03-19 DIAGNOSIS — I456 Pre-excitation syndrome: Secondary | ICD-10-CM | POA: Diagnosis not present

## 2020-03-19 DIAGNOSIS — I10 Essential (primary) hypertension: Secondary | ICD-10-CM | POA: Diagnosis not present

## 2020-03-19 DIAGNOSIS — Z00121 Encounter for routine child health examination with abnormal findings: Secondary | ICD-10-CM | POA: Diagnosis not present

## 2020-03-19 DIAGNOSIS — I714 Abdominal aortic aneurysm, without rupture: Secondary | ICD-10-CM | POA: Diagnosis not present

## 2020-03-19 DIAGNOSIS — Z719 Counseling, unspecified: Secondary | ICD-10-CM | POA: Diagnosis not present

## 2020-04-02 ENCOUNTER — Encounter: Payer: Self-pay | Admitting: Internal Medicine

## 2020-04-16 ENCOUNTER — Encounter (HOSPITAL_COMMUNITY): Payer: Self-pay

## 2020-04-16 ENCOUNTER — Ambulatory Visit (HOSPITAL_COMMUNITY): Payer: Medicare Other

## 2020-05-06 ENCOUNTER — Ambulatory Visit: Payer: Medicare Other | Admitting: Internal Medicine

## 2020-06-12 ENCOUNTER — Encounter: Payer: Self-pay | Admitting: Cardiology

## 2020-06-12 ENCOUNTER — Ambulatory Visit: Payer: Medicare Other | Admitting: Cardiology

## 2020-06-12 ENCOUNTER — Other Ambulatory Visit: Payer: Self-pay

## 2020-06-12 VITALS — BP 128/60 | HR 69 | Resp 16 | Ht 68.0 in | Wt 157.8 lb

## 2020-06-12 DIAGNOSIS — I25119 Atherosclerotic heart disease of native coronary artery with unspecified angina pectoris: Secondary | ICD-10-CM

## 2020-06-12 DIAGNOSIS — Z720 Tobacco use: Secondary | ICD-10-CM

## 2020-06-12 DIAGNOSIS — E782 Mixed hyperlipidemia: Secondary | ICD-10-CM

## 2020-06-12 MED ORDER — NITROGLYCERIN 0.4 MG SL SUBL: 0.4000 mg | SUBLINGUAL_TABLET | SUBLINGUAL | 3 refills | Status: DC | PRN

## 2020-06-12 NOTE — Progress Notes (Signed)
Cardiology Office Note  Date: 06/12/2020   ID: [name], DOB 07-19-1951, MRN [id number]  PCP:  Sharilyn Sites, MD  Cardiologist:  Rozann Lesches, MD Electrophysiologist:  None   Chief Complaint  Patient presents with  . Cardiac follow-up    History of Present Illness: [name] is a 69 y.o. male last seen in October 2020.  He presents for a routine visit.  Reports no active angina or nitroglycerin use.  He continues to follow with Dr. Hilma Favors.  We are requesting his most recent lab work for review.  I reviewed his medications which are stable from a cardiac perspective and outlined below.  We did discuss getting a refill for fresh bottle of nitroglycerin.  I personally reviewed his ECG today which shows normal sinus rhythm, low voltage.  Past Medical History:  Diagnosis Date  . Abdominal aortic aneurysm ([place])   . Adrenal adenoma   . COPD (chronic obstructive pulmonary disease) ([place])   . Coronary artery disease    Multivessel, LVEF 50%, occluded SVG to RCA, DES SVG to OM system 2007  . Essential hypertension   . Hyperlipidemia   . Syncope    Acute gastroenteritis; vasovagal  . Systolic dysfunction    EF 45-50%  . Vitamin B12 deficiency anemia   . Wolff-Parkinson-White (WPW) syndrome    Failed ablation    Past Surgical History:  Procedure Laterality Date  . ABDOMINAL AORTIC ENDOVASCULAR STENT GRAFT N/A 01/11/2017   Procedure: ABDOMINAL AORTIC ENDOVASCULAR STENT GRAFT;  Surgeon: Elam Dutch, MD;  Location: [place];  Service: Vascular;  Laterality: N/A;  . BLADDER SURGERY    . CORONARY ANGIOPLASTY WITH STENT PLACEMENT    . CORONARY ARTERY BYPASS GRAFT  1995   LIMA to LAD , SVG to diagonal, SVG to OM1 and OM2 , SVG to RCA     Current Outpatient Medications  Medication Sig Dispense Refill  . albuterol (PROVENTIL HFA;VENTOLIN HFA) 108 (90 Base) MCG/ACT inhaler Inhale 2 puffs into the lungs every 4 (four) hours as needed for wheezing or shortness of  breath. 1 Inhaler 0  . ALPRAZolam (XANAX) 0.5 MG tablet Take 0.25 mg by mouth at bedtime as needed for sleep.    Marland Kitchen aspirin 325 MG EC tablet Take 325 mg by mouth daily.    Marland Kitchen atorvastatin (LIPITOR) 80 MG tablet TAKE 1 TABLET BY MOUTH ONCE DAILY FOR CHOLESTEROL. 90 tablet 0  . clopidogrel (PLAVIX) 75 MG tablet TAKE (1) TABLET BY MOUTH ONCE DAILY. 90 tablet 1  . fenofibrate (TRICOR) 145 MG tablet TAKE 1 TABLET BY MOUTH ONCE DAILY FOR CHOLESTEROL. 90 tablet 1  . metoprolol tartrate (LOPRESSOR) 25 MG tablet TAKE 1 & 1/2 TABLETS BY MOUTH TWICE DAILY. 270 tablet 1  . nitroGLYCERIN (NITROSTAT) 0.4 MG SL tablet Place 1 tablet (0.4 mg total) under the tongue every 5 (five) minutes as needed. 25 tablet 3  . pantoprazole (PROTONIX) 40 MG tablet TAKE 1 TABLET BY MOUTH ONCE DAILY FOR ACID REFLUX. 90 tablet 1  . polyethylene glycol (MIRALAX / GLYCOLAX) packet Take 17 g by mouth daily as needed for moderate constipation.    . ramipril (ALTACE) 2.5 MG capsule TAKE (1) CAPSULE BY MOUTH ONCE DAILY. 90 capsule 1   No current facility-administered medications for this visit.   Allergies:  Codeine   ROS: No palpitations or syncope.  Physical Exam: VS:  BP 128/60   Pulse 69   Resp 16   Ht 5\' 8"  (1.727 m)  Wt 157 lb 12.8 oz (71.6 kg)   SpO2 98%   BMI 23.99 kg/m , BMI Body mass index is 23.99 kg/m.  Wt Readings from Last 3 Encounters:  06/12/20 157 lb 12.8 oz (71.6 kg)  06/11/19 164 lb (74.4 kg)  02/20/18 170 lb (77.1 kg)    General: Patient appears comfortable at rest. HEENT: Conjunctiva and lids normal, wearing a mask. Neck: Supple, no elevated JVP or carotid bruits, no thyromegaly. Lungs: Clear to auscultation, nonlabored breathing at rest. Cardiac: Regular rate and rhythm, no S3, 2/6 systolic murmur, no pericardial rub. Extremities: No pitting edema, distal pulses 2+.  ECG:  An ECG dated 06/11/2019 was personally reviewed today and demonstrated:  Sinus rhythm with nonspecific T wave  changes.  Recent Labwork:  No interval lab work for review today.  Other Studies Reviewed Today:  Lexiscan Myoview 11/29/2016:  There was no ST segment deviation noted during stress.  T wave inversion was noted during stress in the II, III, aVF and V6 leads.  Defect 1: There is a large defect of severe severity present in the basal inferoseptal, basal inferior, basal inferolateral, mid inferoseptal, mid inferior and mid inferolateral location.  Findings consistent with prior myocardial infarction with a mild degree of peri-infarct ischemia in the inferoseptal wall.  This is an intermediate risk study.  Nuclear stress EF: 55%.  Echocardiogram 02/26/2018: Study Conclusions  - Left ventricle: The cavity size was normal. Wall thickness was normal. Systolic function was at the lower limits of normal. The estimated ejection fraction was 50%. Doppler parameters are consistent with abnormal left ventricular relaxation (grade 1 diastolic dysfunction). Doppler parameters are consistent with indeterminate ventricular filling pressure. - Regional wall motion abnormality: Hypokinesis of the basal-mid inferior and basal-mid inferolateral myocardium. - Aortic valve: Moderately calcified annulus. Trileaflet. - Mitral valve: There was mild regurgitation. - Left atrium: The atrium was mildly dilated. - Right ventricle: Systolic function was mildly reduced.  Assessment and Plan:  1.  Multivessel CAD status post CABG.  He has graft disease and has undergone DES to the SVG to OM.  He remains clinically stable without active angina symptoms.  ECG reviewed.  Plan to refill nitroglycerin, otherwise continue aspirin, Lipitor, Plavix, Altace, and TriCor.  2.  Mixed hyperlipidemia, requesting lipid panel from Dr. Hilma Favors.  He has been tolerating high-dose Lipitor well.  3.  Longstanding tobacco abuse.  We continue to discuss smoking cessation.  He has had a very hard time quitting.  We  did talk about possibility of nicotine replacement and picking a quit date.  Medication Adjustments/Labs and Tests Ordered: Current medicines are reviewed at length with the patient today.  Concerns regarding medicines are outlined above.   Tests Ordered: No orders of the defined types were placed in this encounter.   Medication Changes: Meds ordered this encounter  Medications  . nitroGLYCERIN (NITROSTAT) 0.4 MG SL tablet    Sig: Place 1 tablet (0.4 mg total) under the tongue every 5 (five) minutes as needed.    Dispense:  25 tablet    Refill:  3    Disposition:  Follow up 1 year in the [place] office.  Signed, Satira Sark, MD, [facility] 06/12/2020 3:47 PM    [facility] at [facility] [address], [place], [place] [address] Phone: [phone]; Fax: [phone]

## 2020-06-12 NOTE — Patient Instructions (Signed)
Medication Instructions:  Your physician recommends that you continue on your current medications as directed. Please refer to the Current Medication list given to you today.  *If you need a refill on your cardiac medications before your next appointment, please call your pharmacy*   Lab Work: None today If you hav labs (blood work) drawn today and your tests are completely normal, you will receive your results only by: Marland Kitchen MyChart Message (if you have MyChart) OR . A paper copy in the mail If you have any lab test that is abnormal or we need to change your treatment, we will call you to review the results.   Testing/Procedures: None today   Follow-Up: At [facility], you and your health needs are our priority.  As part of our continuing mission to provide you with exceptional heart care, we have created designated Provider Care Teams.  These Care Teams include your primary Cardiologist (physician) and Advanced Practice Providers (APPs -  Physician Assistants and Nurse Practitioners) who all work together to provide you with the care you need, when you need it.  We recommend signing up for the patient portal called "MyChart".  Sign up information is provided on this After Visit Summary.  MyChart is used to connect with patients for Virtual Visits (Telemedicine).  Patients are able to view lab/test results, encounter notes, upcoming appointments, etc.  Non-urgent messages can be sent to your provider as well.   To learn more about what you can do with MyChart, go to [email or web address].    Your next appointment:   12 month(s)  The format for your next appointment:   In Person  Provider:   Rozann Lesches, MD   Other Instructions None      Thank you for choosing [facility] !

## 2020-07-29 ENCOUNTER — Other Ambulatory Visit (HOSPITAL_COMMUNITY): Payer: Self-pay | Admitting: Family Medicine

## 2020-07-29 ENCOUNTER — Other Ambulatory Visit: Payer: Self-pay | Admitting: Family Medicine

## 2020-07-29 DIAGNOSIS — Z122 Encounter for screening for malignant neoplasm of respiratory organs: Secondary | ICD-10-CM

## 2020-09-03 ENCOUNTER — Other Ambulatory Visit: Payer: Self-pay | Admitting: Cardiology

## 2020-10-28 DIAGNOSIS — B029 Zoster without complications: Secondary | ICD-10-CM | POA: Diagnosis not present

## 2020-10-28 DIAGNOSIS — Z6823 Body mass index (BMI) 23.0-23.9, adult: Secondary | ICD-10-CM | POA: Diagnosis not present

## 2021-02-17 DIAGNOSIS — H25813 Combined forms of age-related cataract, bilateral: Secondary | ICD-10-CM | POA: Diagnosis not present

## 2021-03-15 DIAGNOSIS — N342 Other urethritis: Secondary | ICD-10-CM | POA: Diagnosis not present

## 2021-06-08 DIAGNOSIS — E782 Mixed hyperlipidemia: Secondary | ICD-10-CM | POA: Diagnosis not present

## 2021-06-08 DIAGNOSIS — Z0000 Encounter for general adult medical examination without abnormal findings: Secondary | ICD-10-CM | POA: Diagnosis not present

## 2021-06-08 DIAGNOSIS — I2510 Atherosclerotic heart disease of native coronary artery without angina pectoris: Secondary | ICD-10-CM | POA: Diagnosis not present

## 2021-06-08 DIAGNOSIS — Z23 Encounter for immunization: Secondary | ICD-10-CM | POA: Diagnosis not present

## 2021-06-08 DIAGNOSIS — R7309 Other abnormal glucose: Secondary | ICD-10-CM | POA: Diagnosis not present

## 2021-06-08 DIAGNOSIS — J449 Chronic obstructive pulmonary disease, unspecified: Secondary | ICD-10-CM | POA: Diagnosis not present

## 2021-06-08 DIAGNOSIS — Z6823 Body mass index (BMI) 23.0-23.9, adult: Secondary | ICD-10-CM | POA: Diagnosis not present

## 2021-06-08 DIAGNOSIS — I10 Essential (primary) hypertension: Secondary | ICD-10-CM | POA: Diagnosis not present

## 2021-07-21 DIAGNOSIS — H2522 Age-related cataract, morgagnian type, left eye: Secondary | ICD-10-CM | POA: Diagnosis not present

## 2021-07-21 DIAGNOSIS — H01001 Unspecified blepharitis right upper eyelid: Secondary | ICD-10-CM | POA: Diagnosis not present

## 2021-07-21 DIAGNOSIS — H40011 Open angle with borderline findings, low risk, right eye: Secondary | ICD-10-CM | POA: Diagnosis not present

## 2021-07-21 DIAGNOSIS — H01002 Unspecified blepharitis right lower eyelid: Secondary | ICD-10-CM | POA: Diagnosis not present

## 2021-07-21 DIAGNOSIS — H25811 Combined forms of age-related cataract, right eye: Secondary | ICD-10-CM | POA: Diagnosis not present

## 2021-08-05 ENCOUNTER — Other Ambulatory Visit: Payer: Self-pay | Admitting: Cardiology

## 2021-08-19 DIAGNOSIS — H2522 Age-related cataract, morgagnian type, left eye: Secondary | ICD-10-CM | POA: Diagnosis not present

## 2021-08-24 ENCOUNTER — Encounter (HOSPITAL_COMMUNITY)
Admission: RE | Admit: 2021-08-24 | Discharge: 2021-08-24 | Disposition: A | Discharge status: Home / Self Care | Payer: Medicare Other | Source: Ambulatory Visit | Attending: Ophthalmology | Admitting: Ophthalmology

## 2021-08-26 NOTE — H&P (Signed)
Surgical History & Physical  Patient Name: [name] DOB: Sep 22, 1950  Surgery: Cataract extraction with intraocular lens implant phacoemulsification; Left Eye  Surgeon: Baruch Goldmann MD Surgery Date:  08-30-21 Pre-Op Date:  08-19-21  HPI: A 49 Yr. old male patient is referred by Dr Jorja Loa for cataract and glaucoma eval. Pt has hearing problem, Hx taken from pt and her daughter. 1. The patient complains of difficulty when driving, which began 1 year ago. c/o no vision in left eye over a year. The left eye is affected. The episode is gradual. The condition's severity increased since last visit. Symptoms occur when the patient is driving, inside and outside. This is negatively affecting the patient's quality of life. HPI was performed by Baruch Goldmann .  Medical History: Glaucoma Cataracts Hearing problem Heart Problem High Blood Pressure LDL  Review of Systems Negative Allergic/Immunologic Negative Cardiovascular Negative Constitutional Negative Ear, Nose, Mouth & Throat Negative Endocrine Negative Eyes Negative Gastrointestinal Negative Genitourinary Negative Hemotologic/Lymphatic Negative Integumentary Negative Musculoskeletal Negative Neurological Negative Psychiatry Negative Respiratory  Social   Current every day smoker   Medication  Lisinopril, Atorvastatin, Pantoprazole, Metoprolol, Aspirin, Inhaler, Fenofibrate, Ramipril,   Sx/Procedures Heart Bypass, Heart Stents, Dental Surgery,   Drug Allergies  Codeine,   History & Physical: Heent: Cataract, Left Eye NECK: supple without bruits LUNGS: lungs clear to auscultation CV: regular rate and rhythm Abdomen: soft and non-tender  Impression & Plan: Assessment: 1.  CATARACT HYPERMATURE (MORGAGNIAN) AGE RELATED; Left Eye (H25.22) 2.  COMBINED FORMS AGE RELATED CATARACT; Right Eye (H25.811) 3.  BLEPHARITIS; Right Upper Lid, Right Lower Lid, Left Upper Lid, Left Lower Lid (H01.001,  H01.002,H01.004,H01.005) 4.  DERMATOCHALASIS, no surgery; Right Upper Lid, Left Upper Lid (H02.831, H02.834) 5.  OAG BORDERLINE FINDINGS LOW RISK; Right Eye (H40.011)  Plan: 1.  Cataract accounts for the patient's decreased vision. This visual impairment is not correctable with a tolerable change in glasses or contact lenses. Cataract surgery with an implantation of a new lens should significantly improve the visual and functional status of the patient. Discussed all risks, benefits, alternatives, and potential complications. Discussed the procedures and recovery. Patient desires to have surgery. A-scan ordered and performed today for intra-ocular lens calculations. The surgery will be performed in order to improve vision for driving, reading, and for eye examinations. Recommend phacoemulsification with intra-ocular lens. Recommend Dextenza for post-operative pain and inflammation. Left Eye. Dilates poorly - shugacaine by protocol. [facility] [place]. Malyugin Ring. Omidira.  2.  Will address after left eye.  3.  Recommend regular lid cleaning.  4.  Asymptomatic, recommend observation for now. Findings, prognosis and treatment options reviewed.  5.  Based on cup-to-disc ratio. OCT rNFL shows: thinning OD - may be artifact from cataract. IOPs excellent OU today. Will monitor closely. Detailed discussion about glaucoma today including importance of maintaining good follow up and following treatment plan, and the possibility of irreversible blindness as part of this disease process.

## 2021-08-30 ENCOUNTER — Ambulatory Visit (HOSPITAL_COMMUNITY): Payer: Medicare Other | Admitting: Anesthesiology

## 2021-08-30 ENCOUNTER — Ambulatory Visit (HOSPITAL_COMMUNITY)
Admission: RE | Admit: 2021-08-30 | Discharge: 2021-08-30 | Disposition: A | Discharge status: Home / Self Care | Payer: Medicare Other | Source: Ambulatory Visit | Attending: Ophthalmology | Admitting: Ophthalmology

## 2021-08-30 ENCOUNTER — Encounter (HOSPITAL_COMMUNITY)
Admission: RE | Disposition: A | Discharge status: Home / Self Care | Payer: Self-pay | Source: Ambulatory Visit | Attending: Ophthalmology

## 2021-08-30 ENCOUNTER — Encounter (HOSPITAL_COMMUNITY): Payer: Self-pay | Admitting: Ophthalmology

## 2021-08-30 DIAGNOSIS — J449 Chronic obstructive pulmonary disease, unspecified: Secondary | ICD-10-CM | POA: Diagnosis not present

## 2021-08-30 DIAGNOSIS — F17200 Nicotine dependence, unspecified, uncomplicated: Secondary | ICD-10-CM | POA: Diagnosis not present

## 2021-08-30 DIAGNOSIS — K219 Gastro-esophageal reflux disease without esophagitis: Secondary | ICD-10-CM | POA: Insufficient documentation

## 2021-08-30 DIAGNOSIS — H259 Unspecified age-related cataract: Secondary | ICD-10-CM | POA: Insufficient documentation

## 2021-08-30 DIAGNOSIS — I10 Essential (primary) hypertension: Secondary | ICD-10-CM | POA: Insufficient documentation

## 2021-08-30 DIAGNOSIS — H2522 Age-related cataract, morgagnian type, left eye: Secondary | ICD-10-CM | POA: Diagnosis not present

## 2021-08-30 DIAGNOSIS — H2181 Floppy iris syndrome: Secondary | ICD-10-CM | POA: Diagnosis not present

## 2021-08-30 DIAGNOSIS — F17210 Nicotine dependence, cigarettes, uncomplicated: Secondary | ICD-10-CM | POA: Diagnosis not present

## 2021-08-30 HISTORY — PX: CATARACT EXTRACTION W/PHACO: SHX586

## 2021-08-30 SURGERY — PHACOEMULSIFICATION, CATARACT, WITH IOL INSERTION
Anesthesia: Monitor Anesthesia Care | Site: Eye | Laterality: Left

## 2021-08-30 MED ORDER — POVIDONE-IODINE 5 % OP SOLN
OPHTHALMIC | Status: DC | PRN
  Administered 2021-08-30: 1 via OPHTHALMIC

## 2021-08-30 MED ORDER — FENTANYL CITRATE (PF) 100 MCG/2ML IJ SOLN
INTRAMUSCULAR | Status: AC
  Filled 2021-08-30: qty 2

## 2021-08-30 MED ORDER — SODIUM HYALURONATE 23MG/ML IO SOSY
PREFILLED_SYRINGE | INTRAOCULAR | Status: DC | PRN
  Administered 2021-08-30: 0.6 mL via INTRAOCULAR

## 2021-08-30 MED ORDER — PHENYLEPHRINE-KETOROLAC 1-0.3 % IO SOLN
INTRAOCULAR | Status: AC
  Filled 2021-08-30: qty 4

## 2021-08-30 MED ORDER — TRYPAN BLUE 0.06 % IO SOSY
PREFILLED_SYRINGE | INTRAOCULAR | Status: AC
  Filled 2021-08-30: qty 0.5

## 2021-08-30 MED ORDER — PHENYLEPHRINE HCL 2.5 % OP SOLN
1.0000 [drp] | OPHTHALMIC | Status: AC | PRN
  Administered 2021-08-30 (×3): 1 [drp] via OPHTHALMIC

## 2021-08-30 MED ORDER — BSS IO SOLN
INTRAOCULAR | Status: DC | PRN
  Administered 2021-08-30: 15 mL via INTRAOCULAR

## 2021-08-30 MED ORDER — NEOMYCIN-POLYMYXIN-DEXAMETH 3.5-10000-0.1 OP SUSP
OPHTHALMIC | Status: DC | PRN
  Administered 2021-08-30: 1 [drp] via OPHTHALMIC

## 2021-08-30 MED ORDER — LIDOCAINE HCL (PF) 1 % IJ SOLN
INTRAOCULAR | Status: DC | PRN
  Administered 2021-08-30: 1 mL via OPHTHALMIC

## 2021-08-30 MED ORDER — STERILE WATER FOR IRRIGATION IR SOLN
Status: DC | PRN
  Administered 2021-08-30: 250 mL

## 2021-08-30 MED ORDER — SODIUM HYALURONATE 10 MG/ML IO SOLUTION
PREFILLED_SYRINGE | INTRAOCULAR | Status: DC | PRN
  Administered 2021-08-30: 0.85 mL via INTRAOCULAR

## 2021-08-30 MED ORDER — FENTANYL CITRATE (PF) 100 MCG/2ML IJ SOLN
INTRAMUSCULAR | Status: DC | PRN
  Administered 2021-08-30: 25 ug via INTRAVENOUS

## 2021-08-30 MED ORDER — SODIUM CHLORIDE 0.9% FLUSH
INTRAVENOUS | Status: DC | PRN
  Administered 2021-08-30: 3 mL via INTRAVENOUS

## 2021-08-30 MED ORDER — TROPICAMIDE 1 % OP SOLN
1.0000 [drp] | OPHTHALMIC | Status: AC | PRN
  Administered 2021-08-30 (×3): 1 [drp] via OPHTHALMIC
  Filled 2021-08-30: qty 2

## 2021-08-30 MED ORDER — TRYPAN BLUE 0.06 % IO SOSY
PREFILLED_SYRINGE | INTRAOCULAR | Status: DC | PRN
  Administered 2021-08-30: 0.5 mL via INTRAOCULAR

## 2021-08-30 MED ORDER — MIDAZOLAM HCL 2 MG/2ML IJ SOLN
INTRAMUSCULAR | Status: AC
  Filled 2021-08-30: qty 2

## 2021-08-30 MED ORDER — TETRACAINE HCL 0.5 % OP SOLN
1.0000 [drp] | OPHTHALMIC | Status: AC | PRN
  Administered 2021-08-30 (×3): 1 [drp] via OPHTHALMIC

## 2021-08-30 MED ORDER — PHENYLEPHRINE-KETOROLAC 1-0.3 % IO SOLN
INTRAOCULAR | Status: DC | PRN
  Administered 2021-08-30: 500 mL via OPHTHALMIC

## 2021-08-30 MED ORDER — EPINEPHRINE PF 1 MG/ML IJ SOLN
INTRAMUSCULAR | Status: AC
  Filled 2021-08-30: qty 1

## 2021-08-30 MED ORDER — LIDOCAINE HCL 3.5 % OP GEL
1.0000 "application " | Freq: Once | OPHTHALMIC | Status: AC
  Administered 2021-08-30: 1 via OPHTHALMIC

## 2021-08-30 SURGICAL SUPPLY — 15 items
CATARACT SUITE SIGHTPATH (MISCELLANEOUS) ×2 IMPLANT
CLOTH BEACON ORANGE TIMEOUT ST (SAFETY) ×1 IMPLANT
EYE SHIELD UNIVERSAL CLEAR (GAUZE/BANDAGES/DRESSINGS) ×1 IMPLANT
FEE CATARACT SUITE SIGHTPATH (MISCELLANEOUS) IMPLANT
GLOVE SURG UNDER POLY LF SZ7 (GLOVE) ×2 IMPLANT
LENS IOL RAYNER 22.0 (Intraocular Lens) ×2 IMPLANT
LENS IOL RAYONE EMV 22.0 (Intraocular Lens) IMPLANT
NDL HYPO 18GX1.5 BLUNT FILL (NEEDLE) IMPLANT
NEEDLE HYPO 18GX1.5 BLUNT FILL (NEEDLE) ×2 IMPLANT
PAD ARMBOARD 7.5X6 YLW CONV (MISCELLANEOUS) ×1 IMPLANT
RING MALYGIN 7.0 (MISCELLANEOUS) ×1 IMPLANT
SYR TB 1ML LL NO SAFETY (SYRINGE) ×1 IMPLANT
TAPE SURG TRANSPORE 1 IN (GAUZE/BANDAGES/DRESSINGS) IMPLANT
TAPE SURGICAL TRANSPORE 1 IN (GAUZE/BANDAGES/DRESSINGS) ×2
WATER STERILE IRR 250ML POUR (IV SOLUTION) ×1 IMPLANT

## 2021-08-30 NOTE — Op Note (Signed)
Date of procedure: 08/30/21  Pre-operative diagnosis: Mature, Visually significant age-related cataract, Left Eye (H25.22)  Post-operative diagnosis: Mature Visually significant age-related cataract, Left Eye; Intra-operative Floppy Iris Syndrome, Left Eye  Procedure: Complex Removal of cataract via phacoemulsification and insertion of intra-ocular lensRayner [id number]  +22.0D into the capsular bag of the Left Eye  Attending surgeon: Gerda Diss. Adysen Raphael, MD, MA  Anesthesia: MAC, Topical [place]  Complications: None  Estimated Blood Loss: <56m (minimal)  Specimens: None  Implants: As above  Indications:  Mature Visually significant age-related cataract, Left Eye  Procedure:  The patient was seen and identified in the pre-operative area. The operative eye was identified and dilated.  The operative eye was marked.  Topical anesthesia was administered to the operative eye.     The patient was then to the operative suite and placed in the supine position.  A timeout was performed confirming the patient, procedure to be performed, and all other relevant information.   The patient's face was prepped and draped in the usual fashion for intra-ocular surgery.  A lid speculum was placed into the operative eye and the surgical microscope moved into place and focused.  A lack of red reflex due to a mature cataract was confirmed.  An inferotemporal paracentesis was created using a 20 gauge paracentesis blade.  Vision blue was injected into the anterior chamber.  Shugarcaine was injected into the anterior chamber.  Viscoelastic was injected into the anterior chamber.  A temporal clear-corneal main wound incision was created using a 2.445mmicrokeratome. A 7.74m66malyugin ring was placed. A continuous curvilinear capsulorrhexis was initiated using an irrigating cystitome and completed using capsulorrhexis forceps.  Hydrodissection and hydrodeliniation were performed.  Viscoelastic was injected into the anterior  chamber.  A phacoemulsification handpiece and a chopper as a second instrument were used to remove the nucleus and epinucleus. The irrigation/aspiration handpiece was used to remove any remaining cortical material.   The capsular bag was reinflated with viscoelastic, checked, and found to be intact. The intraocular lens was inserted into the capsular bag and dialed into place using a kuglen hook.  The Malyugin ring was removed. The irrigation/aspiration handpiece was used to remove any remaining viscoelastic.  The clear corneal wound and paracentesis wounds were then hydrated and checked with Weck-Cels to be watertight.  The lid-speculum and drape was removed, and the patient's face was cleaned with a wet and dry 4x4.  Maxitrol was instilled in the eye before a clear shield was taped over the eye. The patient was taken to the post-operative care unit in good condition, having tolerated the procedure well.  Post-Op Instructions: [facility] post[facility]operative evaluation and will receive all other orders and instructions.

## 2021-08-30 NOTE — Anesthesia Postprocedure Evaluation (Signed)
Anesthesia Post Note  Patient: [name]  Procedure(s) Performed: CATARACT EXTRACTION PHACO AND INTRAOCULAR LENS PLACEMENT (IOC) (Left: Eye)  Patient location during evaluation: Phase II Anesthesia Type: MAC Level of consciousness: awake Pain management: pain level controlled Vital Signs Assessment: post-procedure vital signs reviewed and stable Respiratory status: spontaneous breathing and respiratory function stable Cardiovascular status: blood pressure returned to baseline and stable Postop Assessment: no headache and no apparent nausea or vomiting Anesthetic complications: no Comments: Late entry   No notable events documented.   Last Vitals:  Vitals:   08/30/21 0928 08/30/21 1035  BP: (!) 144/79 130/75  Pulse: 69 68  Resp: 18 18  Temp: 37 C 36.8 C  SpO2: 99% 99%    Last Pain:  Vitals:   08/30/21 1035  TempSrc: Oral  PainSc: [place]

## 2021-08-30 NOTE — Transfer of Care (Signed)
Immediate Anesthesia Transfer of Care Note  Patient: [name]  Procedure(s) Performed: CATARACT EXTRACTION PHACO AND INTRAOCULAR LENS PLACEMENT (IOC) (Left: Eye)  Patient Location: PACU  Anesthesia Type:MAC  Level of Consciousness: awake, alert , oriented and patient cooperative  Airway & Oxygen Therapy: Patient Spontanous Breathing  Post-op Assessment: Report given to RN, Post -op Vital signs reviewed and stable and Patient moving all extremities X 4  Post vital signs: Reviewed and stable  Last Vitals:  Vitals Value Taken Time  BP    Temp    Pulse    Resp    SpO2      Last Pain:  Vitals:   08/30/21 0928  TempSrc: Oral  PainSc: 0-No pain      Patients Stated Pain Goal: 6 (92/95/74 [id number])  Complications: No notable events documented.

## 2021-08-30 NOTE — Interval H&P Note (Signed)
History and Physical Interval Note:  08/30/2021 10:02 AM  [name]  has presented today for surgery, with the diagnosis of cataract hypermature age related cataract, left eye.  The various methods of treatment have been discussed with the patient and family. After consideration of risks, benefits and other options for treatment, the patient has consented to  Procedure(s) with comments: CATARACT EXTRACTION PHACO AND INTRAOCULAR LENS PLACEMENT ([place]) (Left) - left as a surgical intervention.  The patient's history has been reviewed, patient examined, no change in status, stable for surgery.  I have reviewed the patient's chart and labs.  Questions were answered to the patient's satisfaction.     Baruch Goldmann

## 2021-08-30 NOTE — Discharge Instructions (Signed)
Please discharge patient when stable, will follow up today with Dr. Jordi Lacko at the [facility] office immediately following discharge.  Leave shield in place until visit.  All paperwork with discharge instructions will be given at the office.  [facility] Address:  [address]  [place], [place] [address]  

## 2021-08-30 NOTE — Anesthesia Preprocedure Evaluation (Signed)
Anesthesia Evaluation  Patient identified by MRN, date of birth, ID band Patient awake    Reviewed: Allergy & Precautions, H&P , NPO status , Patient's Chart, lab work & pertinent test results, reviewed documented beta blocker date and time   Airway Mallampati: II  TM Distance: >3 FB Neck ROM: full    Dental no notable dental hx.    Pulmonary COPD, Current Smoker,    Pulmonary exam normal breath sounds clear to auscultation       Cardiovascular Exercise Tolerance: Good hypertension, negative cardio ROS   Rhythm:regular Rate:Normal     Neuro/Psych negative neurological ROS  negative psych ROS   GI/Hepatic Neg liver ROS, GERD  Medicated,  Endo/Other  negative endocrine ROS  Renal/GU negative Renal ROS  negative genitourinary   Musculoskeletal   Abdominal   Peds  Hematology  (+) Blood dyscrasia, anemia ,   Anesthesia Other Findings   Reproductive/Obstetrics negative OB ROS                             Anesthesia Physical Anesthesia Plan  ASA: 3  Anesthesia Plan: MAC   Post-op Pain Management: Minimal or no pain anticipated   Induction:   PONV Risk Score and Plan:   Airway Management Planned:   Additional Equipment:   Intra-op Plan:   Post-operative Plan:   Informed Consent: I have reviewed the patients History and Physical, chart, labs and discussed the procedure including the risks, benefits and alternatives for the proposed anesthesia with the patient or authorized representative who has indicated his/her understanding and acceptance.     Dental Advisory Given  Plan Discussed with: CRNA  Anesthesia Plan Comments:         Anesthesia Quick Evaluation

## 2021-08-31 ENCOUNTER — Encounter (HOSPITAL_COMMUNITY): Payer: Self-pay | Admitting: Ophthalmology

## 2021-09-09 ENCOUNTER — Encounter (HOSPITAL_COMMUNITY): Payer: Medicare Other

## 2021-09-13 ENCOUNTER — Ambulatory Visit: Admit: 2021-09-13 | Payer: Medicare Other | Admitting: Ophthalmology

## 2021-09-13 SURGERY — PHACOEMULSIFICATION, CATARACT, WITH IOL INSERTION
Anesthesia: Monitor Anesthesia Care | Laterality: Right

## 2021-09-28 ENCOUNTER — Ambulatory Visit: Payer: Medicare Other | Admitting: Cardiology

## 2021-11-04 ENCOUNTER — Other Ambulatory Visit: Payer: Self-pay | Admitting: Cardiology

## 2021-11-16 NOTE — Progress Notes (Signed)
? ?Cardiology Office Note   ? ?Date:  11/22/2021  ? ?ID:  [name], DOB [birth year], MRN [id number] ? ? ?PCP:  Sharilyn Sites, MD ?  ?[place]  ?Cardiologist:  Rozann Lesches, MD   ?Advanced Practice Provider:  No care team member to display ?Electrophysiologist:  None  ? ?[id number]}  ? ?Chief Complaint  ?Patient presents with  ? Follow-up  ? ? ?History of Present Illness:  ?[name] is a 71 y.o. male with history of HTN, HLD, tobacco abuse, CAD S/P CABG 1995 and DES x2 SVG--OM 2007,  AAA S/P stent graft repair of infrarenal aortic aneurysm 12/2016, WPW 1995 with unsuccessful ablation. Saw Dr. Lovena Le 08/2016 and felt it had resolved. PAF but refused NOAC. ? ?Patient saw Dr. [name] 05/2020 and doing well. ? ?Patient comes in for f/u. Denies chest pain. Chronic DOE due to COPD. Still smoking 1 ppd. A lot of stress at home. Stays active doing house chores but no regular exercise. Occasional palpitations.  ? ?Past Medical History:  ?Diagnosis Date  ? Abdominal aortic aneurysm ([place])   ? Adrenal adenoma   ? COPD (chronic obstructive pulmonary disease) ([place])   ? Coronary artery disease   ? Multivessel, LVEF 50%, occluded SVG to RCA, DES SVG to OM system 2007  ? Essential hypertension   ? Hyperlipidemia   ? Syncope   ? Acute gastroenteritis; vasovagal  ? Systolic dysfunction   ? EF 45-50%  ? Vitamin B12 deficiency anemia   ? Wolff-Parkinson-White (WPW) syndrome   ? Failed ablation  ? ? ?Past Surgical History:  ?Procedure Laterality Date  ? ABDOMINAL AORTIC ENDOVASCULAR STENT GRAFT N/A 01/11/2017  ? Procedure: ABDOMINAL AORTIC ENDOVASCULAR STENT GRAFT;  Surgeon: Elam Dutch, MD;  Location: [facility] OR;  Service: Vascular;  Laterality: N/A;  ? BLADDER SURGERY    ? CATARACT EXTRACTION W/PHACO Left 08/30/2021  ? Procedure: CATARACT EXTRACTION PHACO AND INTRAOCULAR LENS PLACEMENT (IOC);  Surgeon: Baruch Goldmann, MD;  Location: [facility]S;  Service: Ophthalmology;  Laterality: Left;  CDE: 45.91  ?  CORONARY ANGIOPLASTY WITH STENT PLACEMENT    ? CORONARY ARTERY BYPASS GRAFT  1995  ? LIMA to LAD , SVG to diagonal, SVG to OM1 and OM2 , SVG to RCA   ? ? ?Current Medications: ?Current Meds  ?Medication Sig  ? albuterol (PROVENTIL HFA;VENTOLIN HFA) 108 (90 Base) MCG/ACT inhaler Inhale 2 puffs into the lungs every 4 (four) hours as needed for wheezing or shortness of breath.  ? ALPRAZolam (XANAX) 0.5 MG tablet Take 0.25 mg by mouth at bedtime as needed for sleep.  ? aspirin EC 81 MG tablet Take 1 tablet (81 mg total) by mouth daily. Swallow whole.  ? atorvastatin (LIPITOR) 80 MG tablet TAKE 1 TABLET BY MOUTH ONCE DAILY FOR CHOLESTEROL.  ? ciprofloxacin (CIPRO) 500 MG tablet Take 500 mg by mouth every 8 (eight) hours.  ? clopidogrel (PLAVIX) 75 MG tablet TAKE (1) TABLET BY MOUTH ONCE DAILY.  ? fenofibrate (TRICOR) 145 MG tablet TAKE 1 TABLET BY MOUTH ONCE DAILY FOR CHOLESTEROL.  ? metoprolol tartrate (LOPRESSOR) 25 MG tablet TAKE 1 & 1/2 TABLETS BY MOUTH TWICE DAILY.  ? nitroGLYCERIN (NITROSTAT) 0.4 MG SL tablet Place 1 tablet (0.4 mg total) under the tongue every 5 (five) minutes as needed.  ? pantoprazole (PROTONIX) 40 MG tablet TAKE 1 TABLET BY MOUTH ONCE DAILY FOR ACID REFLUX.  ? polyethylene glycol (MIRALAX / GLYCOLAX) packet Take 17 g by mouth daily as  needed for moderate constipation.  ? ramipril (ALTACE) 2.5 MG capsule TAKE (1) CAPSULE BY MOUTH ONCE DAILY.  ? [DISCONTINUED] aspirin 325 MG EC tablet Take 325 mg by mouth daily.  ?  ? ?Allergies:   Codeine  ? ?Social History  ? ?Socioeconomic History  ? Marital status: Single  ?  Spouse name: Not on file  ? Number of children: Not on file  ? Years of education: Not on file  ? Highest education level: Not on file  ?Occupational History  ? Not on file  ?Tobacco Use  ? Smoking status: Every Day  ?  Packs/day: 1.00  ?  Years: 40.00  ?  Pack years: 40.00  ?  Types: Cigarettes  ?  Start date: 06/03/1969  ? Smokeless tobacco: Never  ? Tobacco comments:  ?  1 pk a day or  less.   ?Vaping Use  ? Vaping Use: Never used  ?Substance and Sexual Activity  ? Alcohol use: No  ?  Alcohol/week: 0.0 standard drinks  ? Drug use: No  ? Sexual activity: Never  ?  Birth control/protection: None  ?Other Topics Concern  ? Not on file  ?Social History Narrative  ? Not on file  ? ?Social Determinants of Health  ? ?Financial Resource Strain: Not on file  ?Food Insecurity: Not on file  ?Transportation Needs: Not on file  ?Physical Activity: Not on file  ?Stress: Not on file  ?Social Connections: Not on file  ?  ? ?Family History:  The patient's  family history includes Bleeding Disorder in his sister; Diabetes in his brother and brother; Heart attack in his father; Heart disease in his brother and father; Peripheral vascular disease in his sister; Varicose Veins in his brother, brother, and mother.  ? ?ROS:   ?Please see the history of present illness.    ?ROS All other systems reviewed and are negative. ? ? ?PHYSICAL EXAM:   ?VS:  BP 138/76   Pulse 82   Ht '5\' 8"'$  (1.727 m)   Wt 155 lb (70.3 kg)   SpO2 99%   BMI 23.57 kg/m?   ?Physical Exam  ?PFX:TKWI, elderly in no acute distress  ?Neck: no JVD, carotid bruits, or masses ?Cardiac:RRR; 2/6 systolic murmur apex ?Respiratory: decreased breath sounds with scattered rhonchi ?GI: soft, nontender, nondistended, + BS ?Ext: without cyanosis, clubbing, or edema, Good distal pulses bilaterally ?Neuro:  Alert and Oriented x 3, ?Psych: euthymic mood, full affect ? ?Wt Readings from Last 3 Encounters:  ?11/22/21 155 lb (70.3 kg)  ?08/24/21 155 lb (70.3 kg)  ?06/12/20 157 lb 12.8 oz (71.6 kg)  ?  ? ? ?Studies/Labs Reviewed:  ? ?EKG:  EKG is  ordered today.  The ekg ordered today demonstrates NSR, normal EKG ? ?Recent Labs: ?No results found for requested labs within last 8760 hours.  ? ?Lipid Panel ?   ?Component Value Date/Time  ? CHOL 111 07/14/2012 0615  ? TRIG 99 07/14/2012 0615  ? HDL 26 (L) 07/14/2012 0615  ? CHOLHDL 4.3 07/14/2012 0615  ? VLDL 20 07/14/2012  0615  ? [place] 65 07/14/2012 0615  ? ? ?Additional studies/ records that were reviewed today include:  ?Echo 2019 ?Study Conclusions  ? ?- Left ventricle: The cavity size was normal. Wall thickness was  ?  normal. Systolic function was at the lower limits of normal. The  ?  estimated ejection fraction was 50%. Doppler parameters are  ?  consistent with abnormal left ventricular relaxation (grade 1  ?  diastolic dysfunction). Doppler parameters are consistent with  ?  indeterminate ventricular filling pressure.  ?- Regional wall motion abnormality: Hypokinesis of the basal-mid  ?  inferior and basal-mid inferolateral myocardium.  ?- Aortic valve: Moderately calcified annulus. Trileaflet.  ?- Mitral valve: There was mild regurgitation.  ?- Left atrium: The atrium was mildly dilated.  ?- Right ventricle: Systolic function was mildly reduced.  ? ? ?Risk Assessment/Calculations:   ? ?CHA2DS2-VASc Score = 3  ? This indicates a 3.2% annual risk of stroke. ?The patient's score is based upon: ?CHF History: 0 ?HTN History: 1 ?Diabetes History: 0 ?Stroke History: 0 ?Vascular Disease History: 1 ?Age Score: 1 ?Gender Score: 0 ?  ? ? ? ? ? ?ASSESSMENT:   ? ?1. Coronary artery disease involving native coronary artery of native heart with angina pectoris ([place])   ?2. Infrarenal abdominal aortic aneurysm (AAA) without rupture ([place])   ?3. WOLFF (WOLFE)-PARKINSON-WHITE (WPW) SYNDROME   ?4. Paroxysmal atrial fibrillation ([place])   ?5. Essential hypertension   ?6. Tobacco abuse   ?7. Mitral valve insufficiency, unspecified etiology   ? ? ? ?PLAN:  ?In order of problems listed above: ? ?CAD S/P CABG 1995 and DES x2 SVG--OM 2007-on Plavix and patient takes ASA 325 mg because of arthritis. Recommend he decrease ASA 81 mg-He Doesn't want to reduce it. ? ?AAA S/P stent graft repair of infrarenal aortic aneurysm 12/2016,  ? ?WPW 1995 with unsuccessful ablation. Saw Dr. Lovena Le 08/2016 and felt it had resolved-occasional palpitations but not  long. ? ?PAF on EKG 2018 per Dr. Tanna Furry office note but refused NOAC-occasional palpitations  ? ?HTN-BP controlled on altace, metoprolol,  ? ?HLD on lipitor-labs managed by PCP. ? ?Tobacco abuse-smoking cessati

## 2021-11-18 DIAGNOSIS — Z6823 Body mass index (BMI) 23.0-23.9, adult: Secondary | ICD-10-CM | POA: Diagnosis not present

## 2021-11-18 DIAGNOSIS — N390 Urinary tract infection, site not specified: Secondary | ICD-10-CM | POA: Diagnosis not present

## 2021-11-22 ENCOUNTER — Ambulatory Visit (INDEPENDENT_AMBULATORY_CARE_PROVIDER_SITE_OTHER): Payer: Medicare Other | Admitting: Physician Assistant

## 2021-11-22 ENCOUNTER — Encounter: Payer: Self-pay | Admitting: Physician Assistant

## 2021-11-22 VITALS — BP 138/76 | HR 82 | Ht 68.0 in | Wt 155.0 lb

## 2021-11-22 DIAGNOSIS — I340 Nonrheumatic mitral (valve) insufficiency: Secondary | ICD-10-CM | POA: Diagnosis not present

## 2021-11-22 DIAGNOSIS — I480 Paroxysmal atrial fibrillation: Secondary | ICD-10-CM

## 2021-11-22 DIAGNOSIS — I10 Essential (primary) hypertension: Secondary | ICD-10-CM | POA: Diagnosis not present

## 2021-11-22 DIAGNOSIS — I25119 Atherosclerotic heart disease of native coronary artery with unspecified angina pectoris: Secondary | ICD-10-CM | POA: Diagnosis not present

## 2021-11-22 DIAGNOSIS — Z720 Tobacco use: Secondary | ICD-10-CM | POA: Diagnosis not present

## 2021-11-22 DIAGNOSIS — I456 Pre-excitation syndrome: Secondary | ICD-10-CM

## 2021-11-22 DIAGNOSIS — I7143 Infrarenal abdominal aortic aneurysm, without rupture: Secondary | ICD-10-CM | POA: Diagnosis not present

## 2021-11-22 MED ORDER — ASPIRIN EC 81 MG PO TBEC: 81.0000 mg | DELAYED_RELEASE_TABLET | Freq: Every day | ORAL | 3 refills | Status: AC

## 2021-11-22 NOTE — Patient Instructions (Signed)
Medication Instructions:  ?Your physician has recommended you make the following change in your medication:  ? ?Decrease Aspirin to 81 mg Daily  ? ?*If you need a refill on your cardiac medications before your next appointment, please call your pharmacy* ? ? ?Lab Work: ?NONE  ? ?If you have labs (blood work) drawn today and your tests are completely normal, you will receive your results only by: ?MyChart Message (if you have MyChart) OR ?A paper copy in the mail ?If you have any lab test that is abnormal or we need to change your treatment, we will call you to review the results. ? ? ?Testing/Procedures: ?Your physician has requested that you have an echocardiogram. Echocardiography is a painless test that uses sound waves to create images of your heart. It provides your doctor with information about the size and shape of your heart and how well your heart?s chambers and valves are working. This procedure takes approximately one hour. There are no restrictions for this procedure. ? ? ? ?Follow-Up: ?At [facility], you and your health needs are our priority.  As part of our continuing mission to provide you with exceptional heart care, we have created designated Provider Care Teams.  These Care Teams include your primary Cardiologist (physician) and Advanced Practice Providers (APPs -  Physician Assistants and Nurse Practitioners) who all work together to provide you with the care you need, when you need it. ? ?We recommend signing up for the patient portal called "MyChart".  Sign up information is provided on this After Visit Summary.  MyChart is used to connect with patients for Virtual Visits (Telemedicine).  Patients are able to view lab/test results, encounter notes, upcoming appointments, etc.  Non-urgent messages can be sent to your provider as well.   ?To learn more about what you can do with MyChart, go to [email or web address].   ? ?Your next appointment:   ?1 year(s) ? ?The format for your next  appointment:   ?In Person ? ?Provider:   ?You may see Rozann Lesches, MD or one of the following Advanced Practice Providers on your designated Care Team:   ?Bernerd Pho, PA-C  ?Ermalinda Barrios, PA-C   ? ? ?Other Instructions ?Thank you for choosing [facility]! ? ? ? ?

## 2021-11-23 NOTE — Addendum Note (Signed)
Addended by: Christella Scheuermann C on: 11/23/2021 07:44 AM ? ? Modules accepted: Orders ? ?

## 2021-12-08 ENCOUNTER — Ambulatory Visit (HOSPITAL_COMMUNITY): Payer: Medicare Other

## 2022-02-09 ENCOUNTER — Other Ambulatory Visit: Payer: Self-pay | Admitting: Cardiology

## 2022-04-22 ENCOUNTER — Telehealth: Payer: Self-pay | Admitting: *Deleted

## 2022-04-22 NOTE — Patient Outreach (Signed)
  Care Coordination   04/22/2022 Name: [name] MRN: [id number] DOB: [birth year]   Care Coordination Outreach Attempts:  An unsuccessful telephone outreach was attempted today to offer the patient information about available care coordination services as a benefit of their health plan.   Follow Up Plan:  Additional outreach attempts will be made to offer the patient care coordination information and services.   Encounter Outcome:  No Answer  Care Coordination Interventions Activated:  No   Care Coordination Interventions:  No, not indicated    Chong Sicilian, BSN, RN-BC [place] [facility] Pharmacist, community Dial: [phone]

## 2022-05-03 ENCOUNTER — Telehealth: Payer: Self-pay | Admitting: *Deleted

## 2022-05-03 NOTE — Patient Outreach (Signed)
  Care Coordination   05/03/2022 Name: [name] MRN: [id number] DOB: 1950/10/20   Care Coordination Outreach Attempts:  A second unsuccessful outreach was attempted today to offer the patient with information about available care coordination services as a benefit of their health plan.     Follow Up Plan:  Additional outreach attempts will be made to offer the patient care coordination information and services.   Encounter Outcome:  No Answer  Care Coordination Interventions Activated:  No   Care Coordination Interventions:  No, not indicated    Chong Sicilian, BSN, RN-BC RN Care Coordinator [place]: [phone] Main #: [phone]

## 2022-05-09 ENCOUNTER — Other Ambulatory Visit: Payer: Self-pay | Admitting: Cardiology

## 2022-05-10 ENCOUNTER — Telehealth: Payer: Self-pay | Admitting: *Deleted

## 2022-05-10 NOTE — Patient Outreach (Signed)
  Care Coordination   05/10/2022 Name: [name] MRN: [id number] DOB: May 29, 1951   Care Coordination Outreach Attempts:  A third unsuccessful outreach was attempted today to offer the patient with information about available care coordination services as a benefit of their health plan.   Follow Up Plan:  No further outreach attempts will be made at this time. We have been unable to contact the patient to offer or enroll patient in care coordination services  Encounter Outcome:  No Answer  Care Coordination Interventions Activated:  No   Care Coordination Interventions:  No, not indicated    Chong Sicilian, BSN, RN-BC RN Care Coordinator [place]: [phone] Main #: [phone]

## 2022-05-17 ENCOUNTER — Other Ambulatory Visit: Payer: Self-pay | Admitting: Cardiology

## 2022-08-10 ENCOUNTER — Other Ambulatory Visit: Payer: Self-pay | Admitting: Cardiology

## 2022-11-07 ENCOUNTER — Other Ambulatory Visit: Payer: Self-pay | Admitting: Cardiology

## 2023-01-03 ENCOUNTER — Encounter: Payer: Self-pay | Admitting: Cardiology

## 2023-01-03 ENCOUNTER — Ambulatory Visit: Payer: Medicare Other | Attending: Cardiology | Admitting: Cardiology

## 2023-01-03 VITALS — BP 130/84 | HR 80 | Ht 68.0 in | Wt 138.4 lb

## 2023-01-03 DIAGNOSIS — E782 Mixed hyperlipidemia: Secondary | ICD-10-CM

## 2023-01-03 DIAGNOSIS — I10 Essential (primary) hypertension: Secondary | ICD-10-CM | POA: Diagnosis not present

## 2023-01-03 DIAGNOSIS — I25119 Atherosclerotic heart disease of native coronary artery with unspecified angina pectoris: Secondary | ICD-10-CM

## 2023-01-03 MED ORDER — NITROGLYCERIN 0.4 MG SL SUBL: 0.4000 mg | SUBLINGUAL_TABLET | SUBLINGUAL | 3 refills | Status: AC | PRN

## 2023-01-03 MED ORDER — CLOPIDOGREL BISULFATE 75 MG PO TABS: ORAL_TABLET | ORAL | 3 refills | Status: DC

## 2023-01-03 MED ORDER — FENOFIBRATE 145 MG PO TABS: 145.0000 mg | ORAL_TABLET | Freq: Every day | ORAL | 3 refills | Status: DC

## 2023-01-03 MED ORDER — RAMIPRIL 2.5 MG PO CAPS: ORAL_CAPSULE | ORAL | 3 refills | Status: DC

## 2023-01-03 MED ORDER — ATORVASTATIN CALCIUM 80 MG PO TABS: 80.0000 mg | ORAL_TABLET | Freq: Every day | ORAL | 3 refills | Status: DC

## 2023-01-03 MED ORDER — METOPROLOL TARTRATE 25 MG PO TABS: ORAL_TABLET | ORAL | 3 refills | Status: DC

## 2023-01-03 NOTE — Patient Instructions (Signed)
Medication Instructions:  Your physician recommends that you continue on your current medications as directed. Please refer to the Current Medication list given to you today.   Labwork: None today  Testing/Procedures: None today  Follow-Up: 1 year  Any Other Special Instructions Will Be Listed Below (If Applicable).  If you need a refill on your cardiac medications before your next appointment, please call your pharmacy.  

## 2023-01-03 NOTE — Progress Notes (Signed)
    Cardiology Office Note  Date: 01/03/2023   ID: RIVAAN MCNELIS, DOB 07/24/1951, MRN [id number]  History of Present Illness: [name] is a 72 y.o. male last seen in April 2023 by Ms. Lilla Shook, our last encounter was in 2021.  He is here for a routine visit.  Reports no obvious angina, no nitroglycerin use in the interim.  He states that he has been taking his medications regularly, we went over his list today.  He does not report any spontaneous bleeding problems on aspirin and Plavix.  He has lost weight over the last year.  States that his appetite is somewhat limited.  No abdominal pain or stool changes.  He is due for a follow-up visit with his PCP Dr. Phillips Odor this year.  I do not have recent lab work for review.  His last LDL was well-controlled at 59 in October 2022.  Physical Exam: VS:  BP 130/84   Pulse 80   Ht 5\' 8"  (1.727 m)   Wt 138 lb 6.4 oz (62.8 kg)   SpO2 92%   BMI 21.04 kg/m , BMI Body mass index is 21.04 kg/m.  Wt Readings from Last 3 Encounters:  01/03/23 138 lb 6.4 oz (62.8 kg)  11/22/21 155 lb (70.3 kg)  08/24/21 155 lb (70.3 kg)    General: Patient appears comfortable at rest. HEENT: Conjunctiva and lids normal. Neck: Supple, no elevated JVP or carotid bruits. Lungs: Clear to auscultation, nonlabored breathing at rest. Cardiac: Regular rate and rhythm, no S3, 1/6 systolic murmur. Extremities: No pitting edema.  ECG:  An ECG dated 11/22/2021 was personally reviewed today and demonstrated:  Sinus rhythm.  Labwork:  October 2022: Hemoglobin 13.1, platelets 173, BUN 5, creatinine 0.93, potassium 4.4, AST 18, ALT 16, cholesterol 106, triglycerides 71, HDL 32, LDL 59  Other Studies Reviewed Today:  No interval cardiac testing for review today.  Assessment and Plan:  1.  Multivessel CAD status post CABG in 1995 with LIMA to LAD, SVG to diagonal, SVG to OM1 and OM 2, and SVG to RCA.  He underwent DES to the SVG to OM system in 2007 with documentation  of occluded SVG to RCA as well.  LVEF 50% by echocardiogram in 2019.  He reports no obvious angina or nitroglycerin use and prefers observation on medical therapy at this time.  We have discussed general indications for follow-up imaging studies and will hold off unless he starts to develop symptoms.  Continue aspirin and Plavix, Lipitor, Lopressor, Altace, and as needed nitroglycerin.  Refills provided.  2.  Mixed hyperlipidemia, LDL 59 as of October 2022, [facility] at [facility].  He remains on Lipitor 80 mg daily.  Keep follow-up with [facility] and repeat FLP this year.  3.  Essential hypertension.  No change in current regimen.  4.  History of Wolff-Parkinson-White syndrome status post failed ablation.  Reports no palpitations or sudden unexplained syncope.  Disposition:  Follow up  1 year, sooner if needed.  Signed, Jonelle Sidle, M.D., F.A.C.C. [facility] at [facility]

## 2023-02-08 ENCOUNTER — Other Ambulatory Visit: Payer: Self-pay | Admitting: Cardiology

## 2023-05-15 ENCOUNTER — Other Ambulatory Visit: Payer: Self-pay | Admitting: Cardiology

## 2023-11-14 DIAGNOSIS — Z681 Body mass index (BMI) 19 or less, adult: Secondary | ICD-10-CM | POA: Diagnosis not present

## 2023-11-14 DIAGNOSIS — Z0000 Encounter for general adult medical examination without abnormal findings: Secondary | ICD-10-CM | POA: Diagnosis not present

## 2023-11-14 DIAGNOSIS — J449 Chronic obstructive pulmonary disease, unspecified: Secondary | ICD-10-CM | POA: Diagnosis not present

## 2023-11-14 DIAGNOSIS — I2510 Atherosclerotic heart disease of native coronary artery without angina pectoris: Secondary | ICD-10-CM | POA: Diagnosis not present

## 2023-11-14 DIAGNOSIS — R7309 Other abnormal glucose: Secondary | ICD-10-CM | POA: Diagnosis not present

## 2023-11-14 DIAGNOSIS — E782 Mixed hyperlipidemia: Secondary | ICD-10-CM | POA: Diagnosis not present

## 2023-11-14 DIAGNOSIS — I10 Essential (primary) hypertension: Secondary | ICD-10-CM | POA: Diagnosis not present

## 2024-01-25 ENCOUNTER — Other Ambulatory Visit: Payer: Self-pay | Admitting: Cardiology

## 2024-02-05 ENCOUNTER — Other Ambulatory Visit: Payer: Self-pay | Admitting: Cardiology

## 2024-02-28 ENCOUNTER — Ambulatory Visit: Admitting: Cardiology

## 2024-04-09 ENCOUNTER — Ambulatory Visit: Admitting: Cardiology

## 2024-04-12 ENCOUNTER — Ambulatory Visit: Admitting: Cardiology

## 2024-05-03 ENCOUNTER — Other Ambulatory Visit: Payer: Self-pay | Admitting: Cardiology

## 2024-05-03 MED ORDER — METOPROLOL TARTRATE 25 MG PO TABS: ORAL_TABLET | ORAL | 0 refills | Status: DC

## 2024-05-03 NOTE — Addendum Note (Signed)
 Addended by: DARIO IZETTA CROME on: 05/03/2024 02:18 PM   Modules accepted: Orders

## 2024-07-04 ENCOUNTER — Encounter: Payer: Self-pay | Admitting: Cardiology

## 2024-07-04 ENCOUNTER — Ambulatory Visit: Attending: Cardiology | Admitting: Cardiology

## 2024-07-04 VITALS — BP 122/70 | HR 87 | Ht 68.0 in | Wt 128.5 lb

## 2024-07-04 DIAGNOSIS — I10 Essential (primary) hypertension: Secondary | ICD-10-CM

## 2024-07-04 DIAGNOSIS — E782 Mixed hyperlipidemia: Secondary | ICD-10-CM | POA: Diagnosis not present

## 2024-07-04 DIAGNOSIS — I25119 Atherosclerotic heart disease of native coronary artery with unspecified angina pectoris: Secondary | ICD-10-CM | POA: Diagnosis not present

## 2024-07-04 MED ORDER — METOPROLOL TARTRATE 25 MG PO TABS: ORAL_TABLET | ORAL | 3 refills | Status: AC

## 2024-07-04 MED ORDER — CLOPIDOGREL BISULFATE 75 MG PO TABS: ORAL_TABLET | ORAL | 3 refills | Status: AC

## 2024-07-04 MED ORDER — RAMIPRIL 2.5 MG PO CAPS: ORAL_CAPSULE | ORAL | 3 refills | Status: AC

## 2024-07-04 MED ORDER — FENOFIBRATE 145 MG PO TABS: 145.0000 mg | ORAL_TABLET | Freq: Every day | ORAL | 3 refills | Status: AC

## 2024-07-04 MED ORDER — ATORVASTATIN CALCIUM 80 MG PO TABS: 80.0000 mg | ORAL_TABLET | Freq: Every day | ORAL | 3 refills | Status: AC

## 2024-07-04 NOTE — Progress Notes (Signed)
    Cardiology Office Note  Date: 07/04/2024   ID: [name], DOB August 04, 1951, MRN [id number]  History of Present Illness: [name] is a 73 y.o. male last seen in May 2024.  He is here for a follow-up visit.  Reports no angina or interval nitroglycerin  use, stable NYHA class II dyspnea.  States that he has been under a lot of mental stress, worried about bills.  His appetite has not been very good either, weight has decreased about 10 pounds since last year.  At this point does not have a PCP, previously saw Dr. Marvine.  I reviewed his medications.  Cardiac regimen is stable, he needs refills.  I did go over his lab work from March at which point his LDL was 43.  I reviewed his ECG today which shows sinus rhythm with frequent PACs, right bundle branch block and left anterior fascicular block.  Physical Exam: VS:  BP 122/70   Pulse 87   Ht 5' 8 (1.727 m)   Wt 128 lb 8 oz (58.3 kg)   SpO2 96%   BMI 19.54 kg/m , BMI Body mass index is 19.54 kg/m.  Wt Readings from Last 3 Encounters:  07/04/24 128 lb 8 oz (58.3 kg)  01/03/23 138 lb 6.4 oz (62.8 kg)  11/22/21 155 lb (70.3 kg)    General: Patient appears comfortable at rest. HEENT: Conjunctiva and lids normal. Neck: Supple, no elevated JVP or carotid bruits. Lungs: Clear to auscultation, nonlabored breathing at rest. Cardiac: Regular rate and rhythm with ectopy, no S3 or significant systolic murmur. Extremities: No pitting edema.  ECG:  An ECG dated 11/22/2021 was personally reviewed today and demonstrated:  Sinus rhythm.  Labwork:  March 2025: Hemoglobin 12.6, platelets 156, BUN 8, creatinine 0.84, GFR 94, potassium 4.8, AST 46, ALT 37, cholesterol 87, glycerides 59, HDL 30, LDL 43, TSH 2.2  Other Studies Reviewed Today:   Assessment and Plan:  1.  Multivessel CAD status post CABG in 1995 with LIMA to LAD, SVG to diagonal, SVG to OM1 and OM 2, and SVG to RCA.  He underwent DES to the SVG to OM system in 2007 with  documentation of occluded SVG to RCA as well.  LVEF 50% by echocardiogram in 2019.  No angina or interval nitroglycerin  use.  I reviewed his ECG.  We will plan to continue with observation.  He remains on aspirin  81 mg daily, Plavix  75 mg daily, Lipitor  80 mg daily, and as needed nitroglycerin .  Refills provided.   2.  Mixed hyperlipidemia.  LDL 43 in March.  Continue Lipitor  80 mg daily.   3.  Primary hypertension.  Blood pressure is normal today.  Continue Lopressor  25 mg twice daily and Altace  2.5 mg daily.   4.  History of Wolff-Parkinson-White syndrome status post failed ablation.  No palpitations or syncope.  Disposition:  Follow up 1 year.  Signed, Jayson JUDITHANN Sierras, M.D., F.A.C.C. [facility] at [facility]

## 2024-07-04 NOTE — Patient Instructions (Signed)
 Medication Instructions:  Your physician recommends that you continue on your current medications as directed. Please refer to the Current Medication list given to you today.   Labwork: None today  Testing/Procedures: None today  Follow-Up: 1 year  Any Other Special Instructions Will Be Listed Below (If Applicable).  If you need a refill on your cardiac medications before your next appointment, please call your pharmacy.
